# Patient Record
Sex: Female | Born: 1960 | Race: Black or African American | Hispanic: No | Marital: Married | Smoking: Current every day smoker
Health system: Southern US, Community
[De-identification: ages and names within clinical notes are randomized; demographics above are authoritative.]

## PROBLEM LIST (undated history)

## (undated) DIAGNOSIS — E785 Hyperlipidemia, unspecified: Secondary | ICD-10-CM

## (undated) DIAGNOSIS — Z5189 Encounter for other specified aftercare: Secondary | ICD-10-CM

## (undated) DIAGNOSIS — J45909 Unspecified asthma, uncomplicated: Secondary | ICD-10-CM

## (undated) DIAGNOSIS — I10 Essential (primary) hypertension: Secondary | ICD-10-CM

## (undated) DIAGNOSIS — M1990 Unspecified osteoarthritis, unspecified site: Secondary | ICD-10-CM

## (undated) DIAGNOSIS — F419 Anxiety disorder, unspecified: Secondary | ICD-10-CM

## (undated) DIAGNOSIS — I700 Atherosclerosis of aorta: Secondary | ICD-10-CM

## (undated) HISTORY — DX: Encounter for other specified aftercare: Z51.89

## (undated) HISTORY — PX: FOOT SURGERY: SHX648

## (undated) HISTORY — DX: Unspecified osteoarthritis, unspecified site: M19.90

## (undated) HISTORY — DX: Unspecified asthma, uncomplicated: J45.909

## (undated) HISTORY — DX: Essential (primary) hypertension: I10

## (undated) HISTORY — PX: COLONOSCOPY: SHX174

## (undated) HISTORY — DX: Atherosclerosis of aorta: I70.0

## (undated) HISTORY — PX: TUBAL LIGATION: SHX77

## (undated) HISTORY — DX: Hyperlipidemia, unspecified: E78.5

## (undated) HISTORY — DX: Anxiety disorder, unspecified: F41.9

---

## 1999-08-05 ENCOUNTER — Other Ambulatory Visit: Admission: RE | Admit: 1999-08-05 | Discharge: 1999-08-05 | Payer: Self-pay | Admitting: Obstetrics and Gynecology

## 2000-01-22 ENCOUNTER — Encounter: Admission: RE | Admit: 2000-01-22 | Discharge: 2000-01-22 | Payer: Self-pay | Admitting: Hematology and Oncology

## 2000-03-24 ENCOUNTER — Emergency Department (HOSPITAL_COMMUNITY): Admission: EM | Admit: 2000-03-24 | Discharge: 2000-03-24 | Payer: Self-pay | Admitting: *Deleted

## 2000-03-24 ENCOUNTER — Encounter: Payer: Self-pay | Admitting: *Deleted

## 2000-10-27 ENCOUNTER — Emergency Department (HOSPITAL_COMMUNITY): Admission: EM | Admit: 2000-10-27 | Discharge: 2000-10-27 | Payer: Self-pay | Admitting: Emergency Medicine

## 2000-10-28 ENCOUNTER — Other Ambulatory Visit: Admission: RE | Admit: 2000-10-28 | Discharge: 2000-10-28 | Payer: Self-pay | Admitting: Gynecology

## 2000-11-02 ENCOUNTER — Encounter: Payer: Self-pay | Admitting: Gynecology

## 2000-11-02 ENCOUNTER — Encounter: Admission: RE | Admit: 2000-11-02 | Discharge: 2000-11-02 | Payer: Self-pay | Admitting: Gynecology

## 2001-01-04 ENCOUNTER — Emergency Department (HOSPITAL_COMMUNITY): Admission: EM | Admit: 2001-01-04 | Discharge: 2001-01-04 | Payer: Self-pay

## 2001-07-15 ENCOUNTER — Encounter: Payer: Self-pay | Admitting: Internal Medicine

## 2001-07-15 ENCOUNTER — Encounter: Admission: RE | Admit: 2001-07-15 | Discharge: 2001-07-15 | Payer: Self-pay | Admitting: Internal Medicine

## 2004-03-07 ENCOUNTER — Emergency Department (HOSPITAL_COMMUNITY): Admission: EM | Admit: 2004-03-07 | Discharge: 2004-03-07 | Payer: Self-pay | Admitting: Emergency Medicine

## 2004-05-11 ENCOUNTER — Emergency Department (HOSPITAL_COMMUNITY): Admission: EM | Admit: 2004-05-11 | Discharge: 2004-05-11 | Payer: Self-pay | Admitting: Emergency Medicine

## 2004-08-15 ENCOUNTER — Encounter: Admission: RE | Admit: 2004-08-15 | Discharge: 2004-08-15 | Payer: Self-pay | Admitting: Internal Medicine

## 2005-01-07 ENCOUNTER — Encounter: Admission: RE | Admit: 2005-01-07 | Discharge: 2005-01-07 | Payer: Self-pay | Admitting: Internal Medicine

## 2005-03-23 ENCOUNTER — Other Ambulatory Visit: Admission: RE | Admit: 2005-03-23 | Discharge: 2005-03-23 | Payer: Self-pay | Admitting: Obstetrics and Gynecology

## 2005-04-15 ENCOUNTER — Encounter: Admission: RE | Admit: 2005-04-15 | Discharge: 2005-04-15 | Payer: Self-pay | Admitting: Obstetrics and Gynecology

## 2005-10-21 ENCOUNTER — Encounter: Admission: RE | Admit: 2005-10-21 | Discharge: 2005-10-21 | Payer: Self-pay | Admitting: Internal Medicine

## 2006-11-09 ENCOUNTER — Emergency Department (HOSPITAL_COMMUNITY): Admission: EM | Admit: 2006-11-09 | Discharge: 2006-11-09 | Payer: Self-pay | Admitting: Emergency Medicine

## 2006-11-17 ENCOUNTER — Encounter: Admission: RE | Admit: 2006-11-17 | Discharge: 2006-11-17 | Payer: Self-pay | Admitting: Internal Medicine

## 2009-07-05 ENCOUNTER — Emergency Department (HOSPITAL_COMMUNITY): Admission: EM | Admit: 2009-07-05 | Discharge: 2009-07-05 | Payer: Self-pay | Admitting: Emergency Medicine

## 2010-07-13 ENCOUNTER — Encounter: Payer: Self-pay | Admitting: Internal Medicine

## 2010-09-06 LAB — POCT CARDIAC MARKERS
Myoglobin, poc: 31 ng/mL (ref 12–200)
Troponin i, poc: <0.05 ng/mL (ref 0.00–0.09)

## 2010-09-06 LAB — HEPATIC FUNCTION PANEL
Albumin: 3.7 g/dL (ref 3.5–5.2)
Bilirubin, Direct: 0.1 mg/dL (ref 0.0–0.3)
Total Bilirubin: 0.3 mg/dL (ref 0.3–1.2)

## 2010-09-06 LAB — BASIC METABOLIC PANEL
BUN: 11 mg/dL (ref 6–23)
Chloride: 103 mEq/L (ref 96–112)
GFR calc Af Amer: >60 mL/min (ref >60)
GFR calc non Af Amer: >60 mL/min (ref >60)
Potassium: 3.2 mEq/L — ABNORMAL LOW (ref 3.5–5.1)
Sodium: 137 mEq/L (ref 135–145)

## 2010-09-06 LAB — CBC
HCT: 42.2 % (ref 36.0–46.0)
Hemoglobin: 14.4 g/dL (ref 12.0–15.0)
MCV: 86.5 fL (ref 78.0–100.0)
Platelets: 213 10*3/uL (ref 150–400)
RBC: 4.88 MIL/uL (ref 3.87–5.11)
WBC: 8 10*3/uL (ref 4.0–10.5)

## 2010-09-06 LAB — DIFFERENTIAL
Eosinophils Relative: 2 % (ref 0–5)
Lymphocytes Relative: 23 % (ref 12–46)
Lymphs Abs: 1.8 10*3/uL (ref 0.7–4.0)
Monocytes Relative: 6 % (ref 3–12)

## 2010-09-06 LAB — LIPASE, BLOOD: Lipase: 18 U/L (ref 11–59)

## 2010-09-06 LAB — POCT PREGNANCY, URINE: Preg Test, Ur: NEGATIVE

## 2010-09-06 LAB — URINALYSIS, ROUTINE W REFLEX MICROSCOPIC
Ketones, ur: NEGATIVE mg/dL
Nitrite: NEGATIVE
Protein, ur: NEGATIVE mg/dL
pH: 6.5 (ref 5.0–8.0)

## 2010-12-03 ENCOUNTER — Emergency Department (HOSPITAL_COMMUNITY)
Admission: EM | Admit: 2010-12-03 | Discharge: 2010-12-03 | Discharge status: Against Medical Advice | Payer: Self-pay | Attending: Emergency Medicine | Admitting: Emergency Medicine

## 2010-12-03 DIAGNOSIS — Z0389 Encounter for observation for other suspected diseases and conditions ruled out: Secondary | ICD-10-CM | POA: Insufficient documentation

## 2011-02-08 ENCOUNTER — Emergency Department (HOSPITAL_COMMUNITY)
Admission: EM | Admit: 2011-02-08 | Discharge: 2011-02-08 | Disposition: A | Discharge status: Home / Self Care | Payer: Self-pay | Attending: Emergency Medicine | Admitting: Emergency Medicine

## 2011-02-08 ENCOUNTER — Emergency Department (HOSPITAL_COMMUNITY): Payer: Self-pay

## 2011-02-08 DIAGNOSIS — I10 Essential (primary) hypertension: Secondary | ICD-10-CM | POA: Insufficient documentation

## 2011-02-08 DIAGNOSIS — X58XXXA Exposure to other specified factors, initial encounter: Secondary | ICD-10-CM | POA: Insufficient documentation

## 2011-02-08 DIAGNOSIS — M549 Dorsalgia, unspecified: Secondary | ICD-10-CM | POA: Insufficient documentation

## 2011-02-08 DIAGNOSIS — R079 Chest pain, unspecified: Secondary | ICD-10-CM | POA: Insufficient documentation

## 2011-02-08 DIAGNOSIS — T148XXA Other injury of unspecified body region, initial encounter: Secondary | ICD-10-CM | POA: Insufficient documentation

## 2012-04-26 ENCOUNTER — Encounter (HOSPITAL_COMMUNITY): Payer: Self-pay | Admitting: Adult Health

## 2012-04-26 ENCOUNTER — Emergency Department (HOSPITAL_COMMUNITY)
Admission: EM | Admit: 2012-04-26 | Discharge: 2012-04-27 | Disposition: A | Discharge status: Home / Self Care | Payer: Self-pay | Attending: Emergency Medicine | Admitting: Emergency Medicine

## 2012-04-26 DIAGNOSIS — R109 Unspecified abdominal pain: Secondary | ICD-10-CM | POA: Insufficient documentation

## 2012-04-26 DIAGNOSIS — R112 Nausea with vomiting, unspecified: Secondary | ICD-10-CM | POA: Insufficient documentation

## 2012-04-26 DIAGNOSIS — R61 Generalized hyperhidrosis: Secondary | ICD-10-CM | POA: Insufficient documentation

## 2012-04-26 DIAGNOSIS — F17200 Nicotine dependence, unspecified, uncomplicated: Secondary | ICD-10-CM | POA: Insufficient documentation

## 2012-04-26 LAB — COMPREHENSIVE METABOLIC PANEL
ALT: 15 U/L (ref 0–35)
AST: 18 U/L (ref 0–37)
Alkaline Phosphatase: 66 U/L (ref 39–117)
CO2: 22 mEq/L (ref 19–32)
Calcium: 9.4 mg/dL (ref 8.4–10.5)
Chloride: 105 mEq/L (ref 96–112)
GFR calc non Af Amer: 81 mL/min — ABNORMAL LOW (ref >90)
Potassium: 3.5 mEq/L (ref 3.5–5.1)
Sodium: 139 mEq/L (ref 135–145)
Total Bilirubin: 0.2 mg/dL — ABNORMAL LOW (ref 0.3–1.2)

## 2012-04-26 LAB — URINALYSIS, MICROSCOPIC ONLY
Bilirubin Urine: NEGATIVE
Glucose, UA: NEGATIVE mg/dL
Hgb urine dipstick: NEGATIVE
Ketones, ur: NEGATIVE mg/dL
pH: 8 (ref 5.0–8.0)

## 2012-04-26 LAB — CBC WITH DIFFERENTIAL/PLATELET
Basophils Relative: 1 % (ref 0–1)
Eosinophils Absolute: 0.3 10*3/uL (ref 0.0–0.7)
Hemoglobin: 14.8 g/dL (ref 12.0–15.0)
MCH: 28.7 pg (ref 26.0–34.0)
MCHC: 35.2 g/dL (ref 30.0–36.0)
Monocytes Relative: 6 % (ref 3–12)
Neutrophils Relative %: 69 % (ref 43–77)

## 2012-04-26 LAB — POCT I-STAT TROPONIN I: Troponin i, poc: 0.02 ng/mL (ref 0.00–0.08)

## 2012-04-26 MED ORDER — ONDANSETRON 4 MG PO TBDP
ORAL_TABLET | ORAL | Status: AC
  Filled 2012-04-26: qty 2

## 2012-04-26 MED ORDER — ONDANSETRON HCL 4 MG/2ML IJ SOLN
4.0000 mg | Freq: Once | INTRAMUSCULAR | Status: DC
  Filled 2012-04-26: qty 2

## 2012-04-26 MED ORDER — FENTANYL CITRATE 0.05 MG/ML IJ SOLN
50.0000 ug | Freq: Once | INTRAMUSCULAR | Status: AC
  Administered 2012-04-26: 50 ug via NASAL
  Filled 2012-04-26: qty 2

## 2012-04-26 NOTE — ED Notes (Addendum)
Pt reports lower bilateral  abdominal pain that began one-two hours ago associated with nausea and vomiting. Pt is restless and tearful, describes the pain as severe. States she had her tubes tied 27 years ago. Denies discharge and odor, denies urinary symptpoms. Had a normal bowel movement today. Pt is hypertensive. Tearful and hyperventilating. Pt informed RN that her chest was hurting her this am, chest pain has subsided.

## 2012-04-27 ENCOUNTER — Encounter (HOSPITAL_COMMUNITY): Payer: Self-pay | Admitting: Radiology

## 2012-04-27 ENCOUNTER — Emergency Department (HOSPITAL_COMMUNITY): Payer: Self-pay

## 2012-04-27 MED ORDER — ONDANSETRON HCL 4 MG/2ML IJ SOLN
4.0000 mg | Freq: Once | INTRAMUSCULAR | Status: AC
  Administered 2012-04-27: 4 mg via INTRAVENOUS

## 2012-04-27 MED ORDER — IOHEXOL 300 MG/ML  SOLN
100.0000 mL | Freq: Once | INTRAMUSCULAR | Status: AC | PRN
  Administered 2012-04-27: 100 mL via INTRAVENOUS

## 2012-04-27 MED ORDER — PROMETHAZINE HCL 12.5 MG PO TABS: 12.5000 mg | ORAL_TABLET | Freq: Four times a day (QID) | ORAL | Status: DC | PRN

## 2012-04-27 MED ORDER — IOHEXOL 300 MG/ML  SOLN
20.0000 mL | INTRAMUSCULAR | Status: AC
  Administered 2012-04-27 (×2): 20 mL via ORAL

## 2012-04-27 MED ORDER — HYDROCODONE-ACETAMINOPHEN 5-325 MG PO TABS: 1.0000 | ORAL_TABLET | Freq: Four times a day (QID) | ORAL | Status: DC | PRN

## 2012-04-27 MED ORDER — HYDROMORPHONE HCL PF 1 MG/ML IJ SOLN
1.0000 mg | Freq: Once | INTRAMUSCULAR | Status: AC
  Administered 2012-04-27: 1 mg via INTRAVENOUS
  Filled 2012-04-27: qty 1

## 2012-04-27 MED ORDER — SODIUM CHLORIDE 0.9 % IV BOLUS (SEPSIS)
1000.0000 mL | Freq: Once | INTRAVENOUS | Status: AC
  Administered 2012-04-27: 1000 mL via INTRAVENOUS

## 2012-04-27 MED ORDER — SODIUM CHLORIDE 0.9 % IV SOLN
INTRAVENOUS | Status: DC
  Administered 2012-04-27: 02:00:00 via INTRAVENOUS

## 2012-04-27 NOTE — ED Notes (Signed)
Patient transported from CT 

## 2012-04-27 NOTE — ED Notes (Signed)
CT informed that patient has completed her PO contrast

## 2012-04-27 NOTE — ED Notes (Signed)
Patient has completed PO contrast 

## 2012-04-27 NOTE — ED Notes (Signed)
Patient transported to CT 

## 2012-04-27 NOTE — ED Provider Notes (Addendum)
History     CSN: [id number]  Arrival date & time 04/26/12  2053   First MD Initiated Contact with Patient 04/26/12 2347      Chief Complaint  Patient presents with  . Abdominal Pain    (Consider location/radiation/quality/duration/timing/severity/associated sxs/prior treatment) The history is provided by the patient.   patient is a 51 year old female presenting with abdominal pain that began 2 hours prior to arrival associated with nausea and vomiting. Patient was in a lot of pain in the waiting room which was 10 out of 10. Patient now has improved the pain is currently a 4/10. Patient denies any vaginal discharge or urinary symptoms. Patient had normal bowel movements today. Patient did state that there was some anterior chest pain earlier in the day in the morning prior to noon. Chest pain has resolved. The abdominal pain was mostly in the lower quadrants. Started specifically at 5:30. Associated with some diaphoresis vomiting 4 times. No diarrhea pain described as sharp but is now on a.  History reviewed. No pertinent past medical history.  History reviewed. No pertinent past surgical history.  History reviewed. No pertinent family history.  History  Substance Use Topics  . Smoking status: Current Every Day Smoker    Types: Cigarettes  . Smokeless tobacco: Not on file  . Alcohol Use: No    OB History    Grav Para Term Preterm Abortions TAB SAB Ect Mult Living                  Review of Systems  Constitutional: Positive for diaphoresis. Negative for fever.  HENT: Negative for neck pain.   Eyes: Negative for redness.  Respiratory: Negative for shortness of breath.   Cardiovascular: Negative for chest pain.  Gastrointestinal: Positive for nausea, vomiting and abdominal pain. Negative for diarrhea.  Genitourinary: Negative for dysuria and hematuria.  Musculoskeletal: Negative for back pain.  Skin: Negative for rash.  Neurological: Negative for headaches.    Hematological: Does not bruise/bleed easily.    Allergies  Review of patient's allergies indicates no known allergies.  Home Medications   Current Outpatient Rx  Name  Route  Sig  Dispense  Refill  . ACETAMINOPHEN 500 MG PO TABS   Oral   Take 1,000 mg by mouth every 6 (six) hours as needed.         . BC HEADACHE POWDER PO   Oral   Take 1 packet by mouth once as needed. For pain         . IBUPROFEN 200 MG PO TABS   Oral   Take 200 mg by mouth every 6 (six) hours as needed. For pain         . NAPROXEN SODIUM 220 MG PO TABS   Oral   Take 220 mg by mouth 2 (two) times daily with a meal.         . OVER THE COUNTER MEDICATION   Oral   Take 2 tablets by mouth at bedtime as needed. For arthritis pain/sleep         . HYDROCODONE-ACETAMINOPHEN 5-325 MG PO TABS   Oral   Take 1-2 tablets by mouth every 6 (six) hours as needed for pain.   10 tablet   0   . PROMETHAZINE HCL 12.5 MG PO TABS   Oral   Take 1 tablet (12.5 mg total) by mouth every 6 (six) hours as needed for nausea.   12 tablet   0     BP 166/75  Pulse 77  Temp 98.7 F (37.1 C) (Oral)  Resp 16  SpO2 99%  LMP 04/20/2012  Physical Exam  Nursing note and vitals reviewed. Constitutional: She is oriented to person, place, and time. She appears well-developed and well-nourished. No distress.  HENT:  Head: Normocephalic and atraumatic.  Mouth/Throat: Oropharynx is clear and moist.  Eyes: Conjunctivae normal and EOM are normal. Pupils are equal, round, and reactive to light.  Neck: Normal range of motion. Neck supple.  Cardiovascular: Normal rate, regular rhythm and normal heart sounds.   No murmur heard. Pulmonary/Chest: Effort normal and breath sounds normal.  Abdominal: Soft. Bowel sounds are normal. There is no tenderness.  Neurological: She is alert and oriented to person, place, and time. No cranial nerve deficit. She exhibits normal muscle tone. Coordination normal.  Skin: Skin is warm. No  rash noted.    ED Course  Procedures (including critical care time)  Labs Reviewed  COMPREHENSIVE METABOLIC PANEL - Abnormal; Notable for the following:    Glucose, Bld 102 (*)     Total Bilirubin 0.2 (*)     GFR calc non Af Amer 81 (*)     All other components within normal limits  CBC WITH DIFFERENTIAL - Abnormal; Notable for the following:    RBC 5.15 (*)     All other components within normal limits  URINALYSIS, MICROSCOPIC ONLY  POCT PREGNANCY, URINE  POCT I-STAT TROPONIN I   Ct Abdomen Pelvis W Contrast  04/27/2012  *RADIOLOGY REPORT*  Clinical Data: Bilateral lower quadrant abdominal pain, with nausea and vomiting.  CT ABDOMEN AND PELVIS WITH CONTRAST  Technique:  Multidetector CT imaging of the abdomen and pelvis was performed following the standard protocol during bolus administration of intravenous contrast.  Contrast:  100 mL of Omnipaque 300 IV contrast  Comparison: None.  Findings: The visualized lung bases are clear.  The liver and spleen are unremarkable in appearance.  The gallbladder is within normal limits.  Slight prominence of the adrenal glands, particularly on the left, may reflect mild adrenal hyperplasia.  No definite dominant mass is seen.  The pancreas is unremarkable in appearance.  The kidneys are unremarkable in appearance.  There is no evidence of hydronephrosis.  No renal or ureteral stones are seen.  No perinephric stranding is appreciated.  No free fluid is identified.  The small bowel is unremarkable in appearance.  The stomach is within normal limits.  No acute vascular abnormalities are seen.  Scattered calcification is noted along the distal abdominal aorta and its branches.  The appendix is normal in caliber and contains air, without evidence for appendicitis.  The colon is unremarkable in appearance.  The bladder is mildly distended; the anterior bladder wall is significantly thickened.  This may simply reflect decompression, though correlation is suggested  for associated symptoms. Malignancy is considered less likely but cannot be excluded.  There is suggestion of a 3.6 cm fibroid at the fundus of the uterus.  The uterus is otherwise unremarkable in appearance.  The ovaries are relatively symmetric; no suspicious adnexal masses are seen.  No inguinal lymphadenopathy is seen.  No acute osseous abnormalities are identified.  IMPRESSION:  1.  No acute abnormalities seen within the abdomen or pelvis. 2.  Significant thickening of the anterior bladder wall; this may simply reflect decompression, though correlation is suggested for associated symptoms.  Malignancy is considered less likely but cannot be excluded.  3.  Likely fibroid at the fundus of the uterus. 4.  Slight prominence  of the adrenal glands may reflect adrenal hyperplasia. 5.  Scattered calcification along the distal abdominal aorta and its branches.   Original Report Authenticated By: Tonia Ghent, M.D.    Results for orders placed during the hospital encounter of 04/26/12  URINALYSIS, MICROSCOPIC ONLY      Component Value Range   Color, Urine YELLOW  YELLOW   APPearance CLEAR  CLEAR   Specific Gravity, Urine 1.011  1.005 - 1.030   pH 8.0  5.0 - 8.0   Glucose, UA NEGATIVE  NEGATIVE mg/dL   Hgb urine dipstick NEGATIVE  NEGATIVE   Bilirubin Urine NEGATIVE  NEGATIVE   Ketones, ur NEGATIVE  NEGATIVE mg/dL   Protein, ur NEGATIVE  NEGATIVE mg/dL   Urobilinogen, UA 0.2  0.0 - 1.0 mg/dL   Nitrite NEGATIVE  NEGATIVE   Leukocytes, UA NEGATIVE  NEGATIVE   RBC / HPF 0-2  <3 RBC/hpf   Bacteria, UA RARE  RARE   Squamous Epithelial / LPF RARE  RARE  COMPREHENSIVE METABOLIC PANEL      Component Value Range   Sodium 139  135 - 145 mEq/L   Potassium 3.5  3.5 - 5.1 mEq/L   Chloride 105  96 - 112 mEq/L   CO2 22  19 - 32 mEq/L   Glucose, Bld 102 (*) 70 - 99 mg/dL   BUN 14  6 - 23 mg/dL   Creatinine, Ser 6.29  0.50 - 1.10 mg/dL   Calcium 9.4  8.4 - 52.8 mg/dL   Total Protein 7.0  6.0 - 8.3 g/dL    Albumin 3.9  3.5 - 5.2 g/dL   AST 18  0 - 37 U/L   ALT 15  0 - 35 U/L   Alkaline Phosphatase 66  39 - 117 U/L   Total Bilirubin 0.2 (*) 0.3 - 1.2 mg/dL   GFR calc non Af Amer 81 (*) >90 mL/min   GFR calc Af Amer >90  >90 mL/min  CBC WITH DIFFERENTIAL      Component Value Range   WBC 9.8  4.0 - 10.5 K/uL   RBC 5.15 (*) 3.87 - 5.11 MIL/uL   Hemoglobin 14.8  12.0 - 15.0 g/dL   HCT 41.3  24.4 - 01.0 %   MCV 81.7  78.0 - 100.0 fL   MCH 28.7  26.0 - 34.0 pg   MCHC 35.2  30.0 - 36.0 g/dL   RDW 27.2  53.6 - 64.4 %   Platelets 247  150 - 400 K/uL   Neutrophils Relative 69  43 - 77 %   Neutro Abs 6.7  1.7 - 7.7 K/uL   Lymphocytes Relative 22  12 - 46 %   Lymphs Abs 2.2  0.7 - 4.0 K/uL   Monocytes Relative 6  3 - 12 %   Monocytes Absolute 0.6  0.1 - 1.0 K/uL   Eosinophils Relative 3  0 - 5 %   Eosinophils Absolute 0.3  0.0 - 0.7 K/uL   Basophils Relative 1  0 - 1 %   Basophils Absolute 0.1  0.0 - 0.1 K/uL  POCT PREGNANCY, URINE      Component Value Range   Preg Test, Ur NEGATIVE  NEGATIVE  POCT I-STAT TROPONIN I      Component Value Range   Troponin i, poc 0.02  0.00 - 0.08 ng/mL   Comment 3              1. Abdominal pain  MDM  Patient improved in the emergency department. Abdominal pain is improved significantly no further vomiting. Workup without a need specific findings. May have been related to a viral bug. Patient does have some thickening of her bladder but no evidence urinary tract infection we'll have her followup with her primary care Dr. they may want to decide to arrange a cystoscope through urology.        Shelda Jakes, MD 04/27/12 1610  Shelda Jakes, MD 04/27/12 [phone]

## 2017-10-11 ENCOUNTER — Encounter: Payer: Self-pay | Admitting: Family Medicine

## 2017-10-11 ENCOUNTER — Ambulatory Visit (INDEPENDENT_AMBULATORY_CARE_PROVIDER_SITE_OTHER): Payer: BLUE CROSS/BLUE SHIELD | Admitting: Family Medicine

## 2017-10-11 VITALS — BP 140/88 | HR 80 | Ht 63.5 in | Wt 166.6 lb

## 2017-10-11 DIAGNOSIS — I10 Essential (primary) hypertension: Secondary | ICD-10-CM | POA: Diagnosis not present

## 2017-10-11 DIAGNOSIS — Z7689 Persons encountering health services in other specified circumstances: Secondary | ICD-10-CM | POA: Diagnosis not present

## 2017-10-11 DIAGNOSIS — F17200 Nicotine dependence, unspecified, uncomplicated: Secondary | ICD-10-CM | POA: Diagnosis not present

## 2017-10-11 MED ORDER — AMLODIPINE BESYLATE 5 MG PO TABS: 5.0000 mg | ORAL_TABLET | Freq: Every day | ORAL | 1 refills | Status: DC

## 2017-10-11 MED ORDER — HYDROCHLOROTHIAZIDE 12.5 MG PO TABS: 12.5000 mg | ORAL_TABLET | Freq: Every day | ORAL | 1 refills | Status: DC

## 2017-10-11 NOTE — Patient Instructions (Addendum)
Your BP is 140/88. Goal BP is <130/80.    Eat a low salt diet. Continue being active.   Return in 4 weeks with your BP machine and readings.  Come back fasting for your annual exam.

## 2017-10-11 NOTE — Progress Notes (Signed)
   Subjective:    Patient ID: Evelyn [name], female    DOB: 1960/07/03, 57 y.o.   MRN: [id number]  HPI Chief Complaint  Patient presents with  . new pt    new pt get established. bp med she started has now given her a cough   She is new to the practice and here to establish care.  Previous medical care: UC or ED Dr. Tye Savoy was her PCP more than 10 years ago.   Dr. Gertie Fey- OB/GYN and has an upcoming appointment.   HTN- diagnosed in 2006. States she took medication for her BP for a while but she has been off medication for years until approximately 3 weeks ago when she went to the dentist and was told her BP was "very high".  She was started on lisinopril-HCTZ and she has noticed improvement in her BP. However, since starting the medication she has noticed a dry cough. States the pharmacist told her she would have a cough and it cough go away.  She would like switch medications.   Denies history of allergies or GERD.   Denies fever, chills, rhinorrhea, nasal congestion, ear pain, sore throat, dizziness, chest pain, palpitations, shortness of breath, abdominal pain, N/V/D, urinary symptoms, LE edema.   Smoker- since age 89. States she knows she needs to stop. Her mother has COPD and was a heavy smoker until recently.   Married and has 2 kids. 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   LMP: January 2019   Reviewed allergies, medications, past medical, surgical, family, and social history.    Review of Systems Pertinent positives and negatives in the history of present illness.     Objective:   Physical Exam BP 140/88   Pulse 80   Ht 5' 3.5" (1.613 m)   Wt 166 lb 9.6 oz (75.6 kg)   LMP 07/13/2017   SpO2 96%   BMI 29.05 kg/m  Alert and in no distress. Tympanic membranes and canals are normal. Pharyngeal area is normal. Neck is supple without adenopathy or thyromegaly. Cardiac exam shows a regular sinus rhythm without murmurs or gallops. Lungs are clear to auscultation. No LE  edema. Skin is warm and dry.      Assessment & Plan:  Essential hypertension - Plan: amLODipine (NORVASC) 5 MG tablet, hydrochlorothiazide (HYDRODIURIL) 12.5 MG tablet, CBC with Differential/Platelet, Comprehensive metabolic panel  Encounter to establish care  Smoker  Stop lisinopril. Start amlodipine and HCTZ. We will see if her dry cough resolves. Counseling done on controlling modifiable risk factors such as stopping smoking, healthy diet and exercise.  She is overdue for a CPE. She will come back for HTN follow up in 4 weeks and a fasting CPE. She will bring in her BP cuff along with readings. Check CBC, CMP.

## 2017-10-12 LAB — CBC WITH DIFFERENTIAL/PLATELET
BASOS: 1 %
Basophils Absolute: 0 10*3/uL (ref 0.0–0.2)
EOS (ABSOLUTE): 0.2 10*3/uL (ref 0.0–0.4)
EOS: 3 %
HEMATOCRIT: 44.5 % (ref 34.0–46.6)
Hemoglobin: 15.2 g/dL (ref 11.1–15.9)
IMMATURE GRANS (ABS): 0 10*3/uL (ref 0.0–0.1)
IMMATURE GRANULOCYTES: 0 %
LYMPHS: 35 %
Lymphocytes Absolute: 2.7 10*3/uL (ref 0.7–3.1)
MCH: 28.1 pg (ref 26.6–33.0)
MCHC: 34.2 g/dL (ref 31.5–35.7)
MCV: 82 fL (ref 79–97)
Monocytes Absolute: 0.6 10*3/uL (ref 0.1–0.9)
Monocytes: 8 %
NEUTROS PCT: 53 %
Neutrophils Absolute: 4 10*3/uL (ref 1.4–7.0)
Platelets: 268 10*3/uL (ref 150–379)
RBC: 5.41 x10E6/uL — ABNORMAL HIGH (ref 3.77–5.28)
RDW: 15.7 % — ABNORMAL HIGH (ref 12.3–15.4)
WBC: 7.5 10*3/uL (ref 3.4–10.8)

## 2017-10-12 LAB — COMPREHENSIVE METABOLIC PANEL
A/G RATIO: 1.6 (ref 1.2–2.2)
ALT: 14 IU/L (ref 0–32)
AST: 17 IU/L (ref 0–40)
Albumin: 4.5 g/dL (ref 3.5–5.5)
Alkaline Phosphatase: 81 IU/L (ref 39–117)
BUN/Creatinine Ratio: 16 (ref 9–23)
BUN: 17 mg/dL (ref 6–24)
Bilirubin Total: <0.2 mg/dL (ref 0.0–1.2)
CALCIUM: 9.9 mg/dL (ref 8.7–10.2)
CO2: 21 mmol/L (ref 20–29)
Chloride: 105 mmol/L (ref 96–106)
Creatinine, Ser: 1.05 mg/dL — ABNORMAL HIGH (ref 0.57–1.00)
GFR, EST AFRICAN AMERICAN: 69 mL/min/{1.73_m2} (ref >59)
GFR, EST NON AFRICAN AMERICAN: 60 mL/min/{1.73_m2} (ref >59)
GLOBULIN, TOTAL: 2.8 g/dL (ref 1.5–4.5)
Glucose: 114 mg/dL — ABNORMAL HIGH (ref 65–99)
POTASSIUM: 4 mmol/L (ref 3.5–5.2)
SODIUM: 144 mmol/L (ref 134–144)
TOTAL PROTEIN: 7.3 g/dL (ref 6.0–8.5)

## 2017-11-10 NOTE — Progress Notes (Signed)
Subjective:    Patient ID: Evelyn [name], female    DOB: [birth year], 57 y.o.   MRN: [id number]  HPI Chief Complaint  Patient presents with  . not-fasting cpe    not fasting cpe and follow-up on bp. sees obgyn   She is here for a complete physical exam and to follow up on HTN.   BP at home has been between 140/77- 169/97 Diet high in salt.  Reports good medication compliance.  Smoker since age 35. 91- 36 per day. She has been thinking about stopping.   Other providers: Dr. Gertie Fey OB/GYN  Social history: Lives with husband and 55 year old son Denies drinking alcohol, drug use  Diet: fairly healthy  Exercise: 3-4 days per week.   Immunizations: Tdap - 2013  Health maintenance:  Mammogram: 5 years ago. Has an appointment with OB/GYN June 26th Colonoscopy: never  Last Gynecological Exam: June 26th appt Last Menstrual cycle: January 2019  Last Dental Exam: 2 weeks ago  Last Eye Exam: years   Wears seatbelt always,  smoke detectors in home and functioning, does not text while driving and feels safe in home environment.   Reviewed allergies, medications, past medical, surgical, family, and social history.     Review of Systems Review of Systems Constitutional: -fever, -chills, -sweats, -unexpected weight change,-fatigue ENT: -runny nose, -ear pain, -sore throat Cardiology:  -chest pain, -palpitations, -edema Respiratory: -cough, -shortness of breath, -wheezing Gastroenterology: -abdominal pain, -nausea, -vomiting, -diarrhea, -constipation  Hematology: -bleeding or bruising problems Musculoskeletal: -arthralgias, -myalgias, -joint swelling, -back pain Ophthalmology: -vision changes Urology: -dysuria, -difficulty urinating, -hematuria, -urinary frequency, -urgency Neurology: -headache, -weakness, -tingling, -numbness       Objective:   Physical Exam BP (!) 161/94 Comment: pt's cuff  Pulse 94   Ht 5' 3.5" (1.613 m)   Wt 164 lb 3.2 oz (74.5 kg)   LMP 07/14/2017    BMI 28.63 kg/m   General Appearance:    Alert, cooperative, no distress, appears stated age  Head:    Normocephalic, without obvious abnormality, atraumatic  Eyes:    PERRL, conjunctiva/corneas clear, EOM's intact, fundi    benign  Ears:    Normal TM's and external ear canals  Nose:   Nares normal, mucosa normal, no drainage or sinus   tenderness  Throat:   Lips, mucosa, and tongue normal; teeth and gums normal  Neck:   Supple, no lymphadenopathy;  thyroid:  no   enlargement/tenderness/nodules; no carotid   bruit or JVD  Back:    Spine nontender, no curvature, ROM normal, no CVA     tenderness  Lungs:     Clear to auscultation bilaterally without wheezes, rales or     ronchi; respirations unlabored  Chest Wall:    No tenderness or deformity   Heart:    Regular rate and rhythm, S1 and S2 normal, no murmur, rub   or gallop  Breast Exam:    OB/GYN   Abdomen:     Soft, non-tender, nondistended, normoactive bowel sounds,    no masses, no hepatosplenomegaly  Genitalia:    OB/GYN     Extremities:   No clubbing, cyanosis or edema  Pulses:   2+ and symmetric all extremities  Skin:   Skin color, texture, turgor normal, no rashes or lesions  Lymph nodes:   Cervical, supraclavicular, and axillary nodes normal  Neurologic:   CNII-XII intact, normal strength, sensation and gait; reflexes 2+ and symmetric throughout  Psych:   Normal mood, affect, hygiene and grooming.    Urinalysis dipstick: negative       Assessment & Plan:  Routine general medical examination at a health care facility - Plan: Lipid panel, POCT Urinalysis DIP (Proadvantage Device)  Essential hypertension - Plan: amLODipine (NORVASC) 10 MG tablet, DISCONTINUED: amLODipine (NORVASC) 10 MG tablet  Calcification of abdominal aorta (HCC)  Screen for colon cancer - Plan: Ambulatory referral to Gastroenterology  Vaccine counseling  Need for hepatitis C screening test - Plan: Hepatitis C antibody  Screening for HIV  without presence of risk factors - Plan: HIV antibody  Screening for lipid disorders - Plan: Lipid panel  Smoker  Discussed risk factors for heart disease. BP is uncontrolled. We started her on low dose amlodipine and HCTZ at her previous visit and stopped lisinopril due to cough. Apparently her cough has cleared. We will increase her amlodipine and continue on current dose of HCTZ. Avoid sodium.  GI referral.  Screen for HIV and hepatitis C.  Screen for lipid disorders. Discussed that she does have aorta calcification.  Encouraged her to stop smoking.  Vaccine counseling done. She will check on Shingrix.  Follow up in 4 weeks for HTN and bring in readings. She will return for labs, not fasting today.

## 2017-11-11 ENCOUNTER — Ambulatory Visit (INDEPENDENT_AMBULATORY_CARE_PROVIDER_SITE_OTHER): Payer: BLUE CROSS/BLUE SHIELD | Admitting: Family Medicine

## 2017-11-11 ENCOUNTER — Encounter: Payer: Self-pay | Admitting: Family Medicine

## 2017-11-11 VITALS — BP 161/94 | HR 94 | Ht 63.5 in | Wt 164.2 lb

## 2017-11-11 DIAGNOSIS — Z7189 Other specified counseling: Secondary | ICD-10-CM | POA: Diagnosis not present

## 2017-11-11 DIAGNOSIS — Z13220 Encounter for screening for lipoid disorders: Secondary | ICD-10-CM

## 2017-11-11 DIAGNOSIS — Z0000 Encounter for general adult medical examination without abnormal findings: Secondary | ICD-10-CM | POA: Diagnosis not present

## 2017-11-11 DIAGNOSIS — I700 Atherosclerosis of aorta: Secondary | ICD-10-CM | POA: Diagnosis not present

## 2017-11-11 DIAGNOSIS — Z114 Encounter for screening for human immunodeficiency virus [HIV]: Secondary | ICD-10-CM | POA: Diagnosis not present

## 2017-11-11 DIAGNOSIS — Z7185 Encounter for immunization safety counseling: Secondary | ICD-10-CM

## 2017-11-11 DIAGNOSIS — F17200 Nicotine dependence, unspecified, uncomplicated: Secondary | ICD-10-CM

## 2017-11-11 DIAGNOSIS — I10 Essential (primary) hypertension: Secondary | ICD-10-CM | POA: Diagnosis not present

## 2017-11-11 DIAGNOSIS — Z1159 Encounter for screening for other viral diseases: Secondary | ICD-10-CM

## 2017-11-11 DIAGNOSIS — Z1211 Encounter for screening for malignant neoplasm of colon: Secondary | ICD-10-CM | POA: Diagnosis not present

## 2017-11-11 HISTORY — DX: Atherosclerosis of aorta: I70.0

## 2017-11-11 LAB — POCT URINALYSIS DIP (PROADVANTAGE DEVICE)
BILIRUBIN UA: NEGATIVE mg/dL
Bilirubin, UA: NEGATIVE
Glucose, UA: NEGATIVE mg/dL
Leukocytes, UA: NEGATIVE
Nitrite, UA: NEGATIVE
PH UA: 6 (ref 5.0–8.0)
PROTEIN UA: NEGATIVE mg/dL
RBC UA: NEGATIVE
Specific Gravity, Urine: 1.015
Urobilinogen, Ur: NEGATIVE

## 2017-11-11 MED ORDER — AMLODIPINE BESYLATE 10 MG PO TABS: 10.0000 mg | ORAL_TABLET | Freq: Every day | ORAL | 2 refills | Status: DC

## 2017-11-11 MED ORDER — AMLODIPINE BESYLATE 10 MG PO TABS: 10.0000 mg | ORAL_TABLET | Freq: Every day | ORAL | 1 refills | Status: DC

## 2017-11-11 NOTE — Patient Instructions (Addendum)
Your blood pressure is still not in goal range.  I am increasing your amlodipine dose.  Continue on hydrochlorothiazide. Cut out sodium including see salt.  I strongly encourage you to stop smoking.  If you would like to see me and discuss this further please do.  Please return fasting to have your cholesterol checked.  [facility] will call you to schedule your colonoscopy visit.  I would like to see you back in 4 weeks for your hypertension.  Please bring in your blood pressure readings to that visit.   Call your insurance company and find out about your co-pay for the Shingrix vaccine.     Preventative Care for Adults - Female      MAINTAIN REGULAR HEALTH EXAMS:  A routine yearly physical is a good way to check in with your primary care provider about your health and preventive screening. It is also an opportunity to share updates about your health and any concerns you have, and receive a thorough all-over exam.   Most health insurance companies pay for at least some preventative services.  Check with your health plan for specific coverages.  WHAT PREVENTATIVE SERVICES DO WOMEN NEED?  Adult women should have their weight and blood pressure checked regularly.   Women age 51 and older should have their cholesterol levels checked regularly.  Women should be screened for cervical cancer with a Pap smear and pelvic exam beginning at either age 68, or 3 years after they become sexually activity.    Breast cancer screening generally begins at age 42 with a mammogram and breast exam by your primary care provider.    Beginning at age 92 and continuing to age 82, women should be screened for colorectal cancer.  Certain people may need continued testing until age 42.  Updating vaccinations is part of preventative care.  Vaccinations help protect against diseases such as the flu.  Osteoporosis is a disease in which the bones lose minerals and strength as we age. Women ages  38 and over should discuss this with their caregivers, as should women after menopause who have other risk factors.  Lab tests are generally done as part of preventative care to screen for anemia and blood disorders, to screen for problems with the kidneys and liver, to screen for bladder problems, to check blood sugar, and to check your cholesterol level.  Preventative services generally include counseling about diet, exercise, avoiding tobacco, drugs, excessive alcohol consumption, and sexually transmitted infections.    GENERAL RECOMMENDATIONS FOR GOOD HEALTH:  Healthy diet:  Eat a variety of foods, including fruit, vegetables, animal or vegetable protein, such as meat, fish, chicken, and eggs, or beans, lentils, tofu, and grains, such as rice.  Drink plenty of water daily.  Decrease saturated fat in the diet, avoid lots of red meat, processed foods, sweets, fast foods, and fried foods.  Exercise:  Aerobic exercise helps maintain good heart health. At least 30-40 minutes of moderate-intensity exercise is recommended. For example, a brisk walk that increases your heart rate and breathing. This should be done on most days of the week.   Find a type of exercise or a variety of exercises that you enjoy so that it becomes a part of your daily life.  Examples are running, walking, swimming, water aerobics, and biking.  For motivation and support, explore group exercise such as aerobic class, spin class, Zumba, Yoga,or  martial arts, etc.    Set exercise goals for yourself, such as a certain weight goal,  walk or run in a race such as a 5k walk/run.  Speak to your primary care provider about exercise goals.  Disease prevention:  If you smoke or chew tobacco, find out from your caregiver how to quit. It can literally save your life, no matter how long you have been a tobacco user. If you do not use tobacco, never begin.   Maintain a healthy diet and normal weight. Increased weight leads to  problems with blood pressure and diabetes.   The Body Mass Index or BMI is a way of measuring how much of your body is fat. Having a BMI above 27 increases the risk of heart disease, diabetes, hypertension, stroke and other problems related to obesity. Your caregiver can help determine your BMI and based on it develop an exercise and dietary program to help you achieve or maintain this important measurement at a healthful level.  High blood pressure causes heart and blood vessel problems.  Persistent high blood pressure should be treated with medicine if weight loss and exercise do not work.   Fat and cholesterol leaves deposits in your arteries that can block them. This causes heart disease and vessel disease elsewhere in your body.  If your cholesterol is found to be high, or if you have heart disease or certain other medical conditions, then you may need to have your cholesterol monitored frequently and be treated with medication.   Ask if you should have a cardiac stress test if your history suggests this. A stress test is a test done on a treadmill that looks for heart disease. This test can find disease prior to there being a problem.  Menopause can be associated with physical symptoms and risks. Hormone replacement therapy is available to decrease these. You should talk to your caregiver about whether starting or continuing to take hormones is right for you.   Osteoporosis is a disease in which the bones lose minerals and strength as we age. This can result in serious bone fractures. Risk of osteoporosis can be identified using a bone density scan. Women ages 39 and over should discuss this with their caregivers, as should women after menopause who have other risk factors. Ask your caregiver whether you should be taking a calcium supplement and Vitamin D, to reduce the rate of osteoporosis.   Avoid drinking alcohol in excess (more than two drinks per day).  Avoid use of street drugs. Do not  share needles with anyone. Ask for professional help if you need assistance or instructions on stopping the use of alcohol, cigarettes, and/or drugs.  Brush your teeth twice a day with fluoride toothpaste, and floss once a day. Good oral hygiene prevents tooth decay and gum disease. The problems can be painful, unattractive, and can cause other health problems. Visit your dentist for a routine oral and dental check up and preventive care every 6-12 months.   Look at your skin regularly.  Use a mirror to look at your back. Notify your caregivers of changes in moles, especially if there are changes in shapes, colors, a size larger than a pencil eraser, an irregular border, or development of new moles.  Safety:  Use seatbelts 100% of the time, whether driving or as a passenger.  Use safety devices such as hearing protection if you work in environments with loud noise or significant background noise.  Use safety glasses when doing any work that could send debris in to the eyes.  Use a helmet if you ride a bike or  motorcycle.  Use appropriate safety gear for contact sports.  Talk to your caregiver about gun safety.  Use sunscreen with a SPF (or skin protection factor) of 15 or greater.  Lighter skinned people are at a greater risk of skin cancer. Don't forget to also wear sunglasses in order to protect your eyes from too much damaging sunlight. Damaging sunlight can accelerate cataract formation.   Practice safe sex. Use condoms. Condoms are used for birth control and to help reduce the spread of sexually transmitted infections (or STIs).  Some of the STIs are gonorrhea (the clap), chlamydia, syphilis, trichomonas, herpes, HPV (human papilloma virus) and HIV (human immunodeficiency virus) which causes AIDS. The herpes, HIV and HPV are viral illnesses that have no cure. These can result in disability, cancer and death.   Keep carbon monoxide and smoke detectors in your home functioning at all times. Change  the batteries every 6 months or use a model that plugs into the wall.   Vaccinations:  Stay up to date with your tetanus shots and other required immunizations. You should have a booster for tetanus every 10 years. Be sure to get your flu shot every year, since 5%-20% of the [place] population comes down with the flu. The flu vaccine changes each year, so being vaccinated once is not enough. Get your shot in the fall, before the flu season peaks.   Other vaccines to consider:  Human Papilloma Virus or HPV causes cancer of the cervix, and other infections that can be transmitted from person to person. There is a vaccine for HPV, and females should get immunized between the ages of 4 and 73. It requires a series of 3 shots.   Pneumococcal vaccine to protect against certain types of pneumonia.  This is normally recommended for adults age 30 or older.  However, adults younger than 56 years old with certain underlying conditions such as diabetes, heart or lung disease should also receive the vaccine.  Shingles vaccine to protect against Varicella Zoster if you are older than age 30, or younger than 56 years old with certain underlying illness.  Hepatitis A vaccine to protect against a form of infection of the liver by a virus acquired from food.  Hepatitis B vaccine to protect against a form of infection of the liver by a virus acquired from blood or body fluids, particularly if you work in health care.  If you plan to travel internationally, check with your local health department for specific vaccination recommendations.  Cancer Screening:  Breast cancer screening is essential to preventive care for women. All women age 65 and older should perform a breast self-exam every month. At age 59 and older, women should have their caregiver complete a breast exam each year. Women at ages 69 and older should have a mammogram (x-ray film) of the breasts. Your caregiver can discuss how often you need  mammograms.    Cervical cancer screening includes taking a Pap smear (sample of cells examined under a microscope) from the cervix (end of the uterus). It also includes testing for HPV (Human Papilloma Virus, which can cause cervical cancer). Screening and a pelvic exam should begin at age 79, or 3 years after a woman becomes sexually active. Screening should occur every year, with a Pap smear but no HPV testing, up to age 19. After age 35, you should have a Pap smear every 3 years with HPV testing, if no HPV was found previously.   Most routine colon cancer screening begins  at the age of 54. On a yearly basis, doctors may provide special easy to use take-home tests to check for hidden blood in the stool. Sigmoidoscopy or colonoscopy can detect the earliest forms of colon cancer and is life saving. These tests use a small camera at the end of a tube to directly examine the colon. Speak to your caregiver about this at age 33, when routine screening begins (and is repeated every 5 years unless early forms of pre-cancerous polyps or small growths are found).

## 2017-11-12 ENCOUNTER — Telehealth: Payer: Self-pay | Admitting: Family Medicine

## 2017-11-12 ENCOUNTER — Encounter: Payer: Self-pay | Admitting: Family Medicine

## 2017-11-12 ENCOUNTER — Encounter: Payer: Self-pay | Admitting: Internal Medicine

## 2017-11-12 DIAGNOSIS — I10 Essential (primary) hypertension: Secondary | ICD-10-CM

## 2017-11-12 NOTE — Telephone Encounter (Signed)
[facility] pt called for refills of hydrodiuril. Please send to [facility] [address]

## 2017-11-15 NOTE — Telephone Encounter (Signed)
Please send HCTZ to her new pharmacy. Thanks.

## 2017-11-16 ENCOUNTER — Other Ambulatory Visit: Payer: BLUE CROSS/BLUE SHIELD

## 2017-11-16 DIAGNOSIS — Z0000 Encounter for general adult medical examination without abnormal findings: Secondary | ICD-10-CM

## 2017-11-16 DIAGNOSIS — Z1159 Encounter for screening for other viral diseases: Secondary | ICD-10-CM

## 2017-11-16 DIAGNOSIS — Z114 Encounter for screening for human immunodeficiency virus [HIV]: Secondary | ICD-10-CM

## 2017-11-16 DIAGNOSIS — Z13220 Encounter for screening for lipoid disorders: Secondary | ICD-10-CM

## 2017-11-16 MED ORDER — HYDROCHLOROTHIAZIDE 12.5 MG PO TABS: 12.5000 mg | ORAL_TABLET | Freq: Every day | ORAL | 5 refills | Status: DC

## 2017-11-16 NOTE — Telephone Encounter (Signed)
done

## 2017-11-17 LAB — LIPID PANEL
Chol/HDL Ratio: 2.1 ratio (ref 0.0–4.4)
Cholesterol, Total: 177 mg/dL (ref 100–199)
HDL: 83 mg/dL (ref >39)
LDL CALC: 71 mg/dL (ref 0–99)
TRIGLYCERIDES: 114 mg/dL (ref 0–149)
VLDL CHOLESTEROL CAL: 23 mg/dL (ref 5–40)

## 2017-11-17 LAB — HEPATITIS C ANTIBODY: Hep C Virus Ab: <0.1 s/co ratio (ref 0.0–0.9)

## 2017-11-17 LAB — HIV ANTIBODY (ROUTINE TESTING W REFLEX): HIV SCREEN 4TH GENERATION: NONREACTIVE

## 2017-12-13 ENCOUNTER — Ambulatory Visit: Payer: BLUE CROSS/BLUE SHIELD | Admitting: Family Medicine

## 2017-12-14 ENCOUNTER — Other Ambulatory Visit: Payer: Self-pay

## 2017-12-14 ENCOUNTER — Ambulatory Visit (AMBULATORY_SURGERY_CENTER): Payer: Self-pay

## 2017-12-14 VITALS — Ht 63.0 in | Wt 163.8 lb

## 2017-12-14 DIAGNOSIS — Z1211 Encounter for screening for malignant neoplasm of colon: Secondary | ICD-10-CM

## 2017-12-14 MED ORDER — NA SULFATE-K SULFATE-MG SULF 17.5-3.13-1.6 GM/177ML PO SOLN: 1.0000 | Freq: Once | ORAL | 0 refills | Status: AC

## 2017-12-14 NOTE — Progress Notes (Signed)
Denies allergies to eggs or soy products. Denies complication of anesthesia or sedation. Denies use of weight loss medication. Denies use of O2.   Emmi instructions declined.  

## 2017-12-17 ENCOUNTER — Encounter: Payer: Self-pay | Admitting: Internal Medicine

## 2017-12-27 ENCOUNTER — Ambulatory Visit: Payer: BLUE CROSS/BLUE SHIELD | Admitting: Family Medicine

## 2017-12-27 ENCOUNTER — Telehealth: Payer: Self-pay | Admitting: Internal Medicine

## 2017-12-27 NOTE — Telephone Encounter (Signed)
D. 1-2 weeks

## 2017-12-27 NOTE — Telephone Encounter (Signed)

## 2017-12-28 ENCOUNTER — Ambulatory Visit (AMBULATORY_SURGERY_CENTER): Payer: BLUE CROSS/BLUE SHIELD | Admitting: Internal Medicine

## 2017-12-28 ENCOUNTER — Telehealth: Payer: Self-pay

## 2017-12-28 ENCOUNTER — Encounter: Payer: Self-pay | Admitting: Internal Medicine

## 2017-12-28 VITALS — BP 139/79 | HR 81 | Temp 96.9°F | Resp 26 | Ht 63.5 in | Wt 164.0 lb

## 2017-12-28 DIAGNOSIS — Z1211 Encounter for screening for malignant neoplasm of colon: Secondary | ICD-10-CM

## 2017-12-28 DIAGNOSIS — D122 Benign neoplasm of ascending colon: Secondary | ICD-10-CM

## 2017-12-28 MED ORDER — SODIUM CHLORIDE 0.9 % IV SOLN: 500.0000 mL | Freq: Once | INTRAVENOUS | Status: DC

## 2017-12-28 NOTE — Progress Notes (Signed)
Report to PACU, RN, vss, BBS= Clear.  

## 2017-12-28 NOTE — Op Note (Signed)
[facility] Patient Name: Evelyn [name] Procedure Date: 12/28/2017 2:36 PM MRN: [id number] Endoscopist: Jerene Bears , MD Age: 57 Referring MD:  Date of Birth: Sep 30, 1960 Gender: Female Account #: [id number] Procedure:                Colonoscopy Indications:              Screening for colorectal malignant neoplasm, This                            is the patient's first colonoscopy Medicines:                Monitored Anesthesia Care Procedure:                Pre-Anesthesia Assessment:                           - Prior to the procedure, a History and Physical                            was performed, and patient medications and                            allergies were reviewed. The patient's tolerance of                            previous anesthesia was also reviewed. The risks                            and benefits of the procedure and the sedation                            options and risks were discussed with the patient.                            All questions were answered, and informed consent                            was obtained. Prior Anticoagulants: The patient has                            taken no previous anticoagulant or antiplatelet                            agents. ASA Grade Assessment: II - A patient with                            mild systemic disease. After reviewing the risks                            and benefits, the patient was deemed in                            satisfactory condition to undergo the procedure.  After obtaining informed consent, the colonoscope                            was passed under direct vision. Throughout the                            procedure, the patient's blood pressure, pulse, and                            oxygen saturations were monitored continuously. The                            Colonoscope was introduced through the anus and                            advanced to the terminal ileum.  The colonoscopy was                            performed without difficulty. The patient tolerated                            the procedure well. The quality of the bowel                            preparation was good. The ileocecal valve,                            appendiceal orifice, and rectum were photographed. Scope In: 2:37:48 PM Scope Out: 2:57:11 PM Scope Withdrawal Time: 0 hours 12 minutes 47 seconds  Total Procedure Duration: 0 hours 19 minutes 23 seconds  Findings:                 Hemorrhoids were found on perianal exam.                           Two sessile polyps were found in the ascending                            colon. The polyps were 3 to 4 mm in size. These                            polyps were removed with a cold snare. Resection                            and retrieval were complete.                           Multiple small-mouthed diverticula were found in                            the hepatic flexure, ascending colon and cecum.                           Internal hemorrhoids were found during  retroflexion, during perianal exam and during                            digital exam. The hemorrhoids were large. Complications:            No immediate complications. Estimated Blood Loss:     Estimated blood loss was minimal. Impression:               - Two 3 to 4 mm polyps in the ascending colon,                            removed with a cold snare. Resected and retrieved.                           - Diverticulosis at the hepatic flexure, in the                            ascending colon and in the cecum.                           - Large internal hemorrhoids. Recommendation:           - Patient has a contact number available for                            emergencies. The signs and symptoms of potential                            delayed complications were discussed with the                            patient. Return to normal activities  tomorrow.                            Written discharge instructions were provided to the                            patient.                           - Resume previous diet.                           - Continue present medications.                           - Await pathology results.                           - If hemorrhoidal treatment is needed, the best                            option would be surgical hemorrhoidectomy (given                            size I do not feel internal hemorrhoids are  amenable to banding).                           - Repeat colonoscopy is recommended. The                            colonoscopy date will be determined after pathology                            results from today's exam become available for                            review. Jerene Bears, MD 12/28/2017 3:01:14 PM This report has been signed electronically.

## 2017-12-28 NOTE — Progress Notes (Signed)
I have reviewed the patient's medical history in detail and updated the computerized patient record.

## 2017-12-28 NOTE — Telephone Encounter (Signed)
-----   Message from Jerene Bears, MD sent at 12/28/2017  3:13 PM EDT ----- Regarding: referral Please refer patient to [facility] to see either Dr. Dema Severin, Dr. Marcello Moores or Dr. Johney Maine for symptomatic, large, internal hemorrhoids for hemorrhoidectomy Based on size her hemorrhoids or not felt amenable to hemorrhoidal banding in my clinic Thanks [facility]

## 2017-12-28 NOTE — Patient Instructions (Signed)
YOU HAD AN ENDOSCOPIC PROCEDURE TODAY AT [facility]:   Refer to the procedure report that was given to you for any specific questions about what was found during the examination.  If the procedure report does not answer your questions, please call your gastroenterologist to clarify.  If you requested that your care partner not be given the details of your procedure findings, then the procedure report has been included in a sealed envelope for you to review at your convenience later.  YOU SHOULD EXPECT: Some feelings of bloating in the abdomen. Passage of more gas than usual.  Walking can help get rid of the air that was put into your GI tract during the procedure and reduce the bloating. If you had a lower endoscopy (such as a colonoscopy or flexible sigmoidoscopy) you may notice spotting of blood in your stool or on the toilet paper. If you underwent a bowel prep for your procedure, you may not have a normal bowel movement for a few days.  Please Note:  You might notice some irritation and congestion in your nose or some drainage.  This is from the oxygen used during your procedure.  There is no need for concern and it should clear up in a day or so.  SYMPTOMS TO REPORT IMMEDIATELY:   Following lower endoscopy (colonoscopy or flexible sigmoidoscopy):  Excessive amounts of blood in the stool  Significant tenderness or worsening of abdominal pains  Swelling of the abdomen that is new, acute  Fever of 100F or higher   For urgent or emergent issues, a gastroenterologist can be reached at any hour by calling [phone].   DIET:  We do recommend a small meal at first, but then you may proceed to your regular diet.  Drink plenty of fluids but you should avoid alcoholic beverages for 24 hours.  ACTIVITY:  You should plan to take it easy for the rest of today and you should NOT DRIVE or use heavy machinery until tomorrow (because of the sedation medicines used during the test).     FOLLOW UP: Our staff will call the number listed on your records the next business day following your procedure to check on you and address any questions or concerns that you may have regarding the information given to you following your procedure. If we do not reach you, we will leave a message.  However, if you are feeling well and you are not experiencing any problems, there is no need to return our call.  We will assume that you have returned to your regular daily activities without incident.  If any biopsies were taken you will be contacted by phone or by letter within the next 1-3 weeks.  Please call us at [phone] if you have not heard about the biopsies in 3 weeks.    SIGNATURES/CONFIDENTIALITY: You and/or your care partner have signed paperwork which will be entered into your electronic medical record.  These signatures attest to the fact that that the information above on your After Visit Summary has been reviewed and is understood.  Full responsibility of the confidentiality of this discharge information lies with you and/or your care-partner.    Handouts were given to your care partner on polyps, diverticulosis, and hemorrhoids. You may resume your current medications today. Await biopsy results. If hemorrhoidal treatment is needed, the best option would be surgical hemorrhoidectomy (given the size Dr. Hilarie Fredrickson does not feel internal hemorrhoids are amenable to banding). Please call if any questions  or concerns.   

## 2017-12-28 NOTE — Telephone Encounter (Signed)
Referral faxed to [facility] for surgical referral.

## 2017-12-28 NOTE — Progress Notes (Signed)
No problems noted in the recovery room. Maw  Pt requested a bed pan while she was still waiting for time on the monitor.  Bed pan was given. maw

## 2017-12-28 NOTE — Progress Notes (Signed)
Called to room to assist during endoscopic procedure.  Patient ID and intended procedure confirmed with present staff. Received instructions for my participation in the procedure from the performing physician.  

## 2017-12-29 ENCOUNTER — Telehealth: Payer: Self-pay | Admitting: *Deleted

## 2017-12-29 NOTE — Telephone Encounter (Signed)
  Follow up Call-  Call back number 12/28/2017  Post procedure Call Back phone  # [phone]  Permission to leave phone message Yes  Some recent data might be hidden     Patient questions:  Do you have a fever, pain , or abdominal swelling? No. Pain Score  0 *  Have you tolerated food without any problems? Yes.    Have you been able to return to your normal activities? Yes.    Do you have any questions about your discharge instructions: Diet   No. Medications  No. Follow up visit  No.  Do you have questions or concerns about your Care? No.  Actions: * If pain score is 4 or above: No action needed, pain <4.

## 2017-12-31 ENCOUNTER — Encounter: Payer: Self-pay | Admitting: Internal Medicine

## 2018-01-07 ENCOUNTER — Other Ambulatory Visit: Payer: Self-pay | Admitting: Family Medicine

## 2018-01-07 DIAGNOSIS — I10 Essential (primary) hypertension: Secondary | ICD-10-CM

## 2018-01-10 ENCOUNTER — Encounter: Payer: Self-pay | Admitting: Family Medicine

## 2018-01-10 NOTE — Telephone Encounter (Signed)
No show letter with appt request sent.  °

## 2018-01-27 ENCOUNTER — Other Ambulatory Visit: Payer: Self-pay | Admitting: Family Medicine

## 2018-01-27 DIAGNOSIS — I10 Essential (primary) hypertension: Secondary | ICD-10-CM

## 2018-01-27 NOTE — Telephone Encounter (Signed)
Needs follow up visit and ok to refill for 30 days

## 2018-01-27 NOTE — Telephone Encounter (Signed)
Is this ok to refill?  

## 2018-01-27 NOTE — Telephone Encounter (Signed)
Pt will call back when she looks at her work calendar. I will refill for 30 days

## 2018-02-02 ENCOUNTER — Ambulatory Visit: Payer: BLUE CROSS/BLUE SHIELD | Admitting: Family Medicine

## 2018-02-02 ENCOUNTER — Encounter: Payer: Self-pay | Admitting: Family Medicine

## 2018-02-02 VITALS — BP 140/80 | HR 76 | Ht 63.0 in | Wt 167.6 lb

## 2018-02-02 DIAGNOSIS — R202 Paresthesia of skin: Secondary | ICD-10-CM | POA: Diagnosis not present

## 2018-02-02 DIAGNOSIS — I10 Essential (primary) hypertension: Secondary | ICD-10-CM

## 2018-02-02 DIAGNOSIS — F17200 Nicotine dependence, unspecified, uncomplicated: Secondary | ICD-10-CM | POA: Diagnosis not present

## 2018-02-02 MED ORDER — LOSARTAN POTASSIUM-HCTZ 50-12.5 MG PO TABS: 1.0000 | ORAL_TABLET | Freq: Every day | ORAL | 2 refills | Status: DC

## 2018-02-02 NOTE — Progress Notes (Signed)
   Subjective:    Patient ID: Evelyn Evelyn [name], female    DOB: [birth year], 57 y.o.   MRN: [id number]  HPI Chief Complaint  Patient presents with  . 1 follow-up    1 month follow-up on BP. running in normal. read side effects on med, would like something different   She is here to follow up on HTN and has a new complaints of paresthesias.  Stopped amlodipine due to concerns about potential side effects. Reports taking HCTZ. History of dry cough with lisinopril.   HTN- has not been checking her BP at home.   She has URI symptoms along with some nausea that appears to be related to post nasal drainage.   Reports having intermittent and fleeting sensations of numbness/tingling over the past 2-3 weeks. States this has been in different locations. Occasionally in her upper back, legs, or at times per upper extremities. It is always changing locations. Denies dizziness or weakness.  She is still smoking but has cut back.   Diet - has cut back on sodium.   Menopausal.  Dr. Gertie Fey put her on vitamin D in June. UTD with pap smear and mammogram.   Denies fever, chills, night sweats, unexplained weight loss, headache, vision changes, chest pain, palpitations, shortness of breath, abdominal pain, V/D, urinary symptoms, LE edema.   Reviewed allergies, medications, past medical, surgical, family, and social history.    Review of Systems Pertinent positives and negatives in the history of present illness.     Objective:   Physical Exam BP 140/80   Pulse 76   Ht 5\' 3"  (1.6 m)   Wt 167 lb 9.6 oz (76 kg)   BMI 29.69 kg/m   Alert and oriented in no distress. Pharyngeal area is normal. Neck is supple without adenopathy or thyromegaly. Cardiac exam shows a regular sinus rhythm without murmurs or gallops. Lungs are clear to auscultation. Extremities without edema, pulses intact. Skin is warm and dry, no pallor or rash. PERRLA, EOMs intact, CN II-IX normal. No facial asymmetry or focal weakness.  Equal grip strength. No pronator drift. Normal and symmetric DTRs. Negative Romberg.        Assessment & Plan:  Essential hypertension - Plan: CBC with Differential/Platelet, Comprehensive metabolic panel, losartan-hydrochlorothiazide (HYZAAR) 50-12.5 MG tablet  Smoker  Paresthesias - Plan: CBC with Differential/Platelet, Comprehensive metabolic panel, TSH, T4, free, RPR, Vitamin B12  Blood pressure is not quite at goal.  She stopped amlodipine on her own after reading possible side effects.  In the past she has been intolerant to lisinopril due to dry cough. She reports doing well on hydrochlorothiazide.  I will prescribe combination losartan/HCTZ for her today.  She is aware that this is the only medication she needs to take for her blood pressure. She will keep an eye on her blood pressure at home and bring in her readings when she returns in 3 to 4 weeks. Counseling done on low-sodium diet and increasing her physical activity. Smoking cessation counseling done.  She has cut back but is not ready to stop. Discussed that intermittent paresthesias that is moving to different parts of her body does not make me worried that we will check labs including vitamin B12 Evelyn [name].  She will keep down any new or worsening symptoms and report back. Her neurological exam is normal today. Follow-up pending labs.

## 2018-02-02 NOTE — Patient Instructions (Signed)
Stop the hydrochlorothiazide and amlodipine as discussed.  Start taking the combination tablet with losartan and hydrochlorothiazide.   Keep an eye on your blood pressures at home and bring in your readings when you come in 3 to 4 weeks.  Continue eating a low-salt diet and increase your physical activity.  Make sure you are stretching.  We will call you with your lab results.

## 2018-02-03 LAB — CBC WITH DIFFERENTIAL/PLATELET
BASOS: 1 %
Basophils Absolute: 0 10*3/uL (ref 0.0–0.2)
EOS (ABSOLUTE): 0.1 10*3/uL (ref 0.0–0.4)
Eos: 2 %
Hematocrit: 43.2 % (ref 34.0–46.6)
Hemoglobin: 14.8 g/dL (ref 11.1–15.9)
Immature Grans (Abs): 0 10*3/uL (ref 0.0–0.1)
Immature Granulocytes: 1 %
LYMPHS ABS: 2 10*3/uL (ref 0.7–3.1)
Lymphs: 35 %
MCH: 28.3 pg (ref 26.6–33.0)
MCHC: 34.3 g/dL (ref 31.5–35.7)
MCV: 83 fL (ref 79–97)
Monocytes Absolute: 0.3 10*3/uL (ref 0.1–0.9)
Monocytes: 6 %
NEUTROS ABS: 3.1 10*3/uL (ref 1.4–7.0)
Neutrophils: 55 %
PLATELETS: 251 10*3/uL (ref 150–450)
RBC: 5.23 x10E6/uL (ref 3.77–5.28)
RDW: 15.5 % — ABNORMAL HIGH (ref 12.3–15.4)
WBC: 5.7 10*3/uL (ref 3.4–10.8)

## 2018-02-03 LAB — COMPREHENSIVE METABOLIC PANEL
A/G RATIO: 1.9 (ref 1.2–2.2)
ALT: 18 IU/L (ref 0–32)
AST: 17 IU/L (ref 0–40)
Albumin: 4.7 g/dL (ref 3.5–5.5)
Alkaline Phosphatase: 65 IU/L (ref 39–117)
BILIRUBIN TOTAL: 0.4 mg/dL (ref 0.0–1.2)
BUN/Creatinine Ratio: 20 (ref 9–23)
BUN: 13 mg/dL (ref 6–24)
CHLORIDE: 100 mmol/L (ref 96–106)
CO2: 26 mmol/L (ref 20–29)
Calcium: 9.3 mg/dL (ref 8.7–10.2)
Creatinine, Ser: 0.65 mg/dL (ref 0.57–1.00)
GFR calc non Af Amer: 100 mL/min/{1.73_m2} (ref >59)
GFR, EST AFRICAN AMERICAN: 116 mL/min/{1.73_m2} (ref >59)
Globulin, Total: 2.5 g/dL (ref 1.5–4.5)
Glucose: 98 mg/dL (ref 65–99)
POTASSIUM: 3.5 mmol/L (ref 3.5–5.2)
Sodium: 139 mmol/L (ref 134–144)
TOTAL PROTEIN: 7.2 g/dL (ref 6.0–8.5)

## 2018-02-03 LAB — T4, FREE: FREE T4: 1.11 ng/dL (ref 0.82–1.77)

## 2018-02-03 LAB — VITAMIN B12: Vitamin B-12: 445 pg/mL (ref 232–1245)

## 2018-02-03 LAB — RPR: RPR: NONREACTIVE

## 2018-02-03 LAB — TSH: TSH: 0.986 u[IU]/mL (ref 0.450–4.500)

## 2018-02-23 ENCOUNTER — Ambulatory Visit: Payer: BLUE CROSS/BLUE SHIELD | Admitting: Family Medicine

## 2018-04-13 ENCOUNTER — Ambulatory Visit (INDEPENDENT_AMBULATORY_CARE_PROVIDER_SITE_OTHER): Payer: BLUE CROSS/BLUE SHIELD | Admitting: Nurse Practitioner

## 2018-04-13 ENCOUNTER — Encounter: Payer: Self-pay | Admitting: Nurse Practitioner

## 2018-04-13 VITALS — BP 130/80 | HR 88 | Temp 98.4°F | Ht 63.0 in | Wt 163.4 lb

## 2018-04-13 DIAGNOSIS — M542 Cervicalgia: Secondary | ICD-10-CM | POA: Diagnosis not present

## 2018-04-13 DIAGNOSIS — K649 Unspecified hemorrhoids: Secondary | ICD-10-CM

## 2018-04-13 DIAGNOSIS — I10 Essential (primary) hypertension: Secondary | ICD-10-CM

## 2018-04-13 DIAGNOSIS — R202 Paresthesia of skin: Secondary | ICD-10-CM | POA: Diagnosis not present

## 2018-04-13 DIAGNOSIS — R200 Anesthesia of skin: Secondary | ICD-10-CM

## 2018-04-13 DIAGNOSIS — Z720 Tobacco use: Secondary | ICD-10-CM

## 2018-04-13 MED ORDER — LOSARTAN POTASSIUM-HCTZ 50-12.5 MG PO TABS: 1.0000 | ORAL_TABLET | Freq: Every day | ORAL | 2 refills | Status: DC

## 2018-04-13 MED ORDER — HYDROCORTISONE 1 % EX CREA: TOPICAL_CREAM | CUTANEOUS | 1 refills | Status: DC

## 2018-04-13 NOTE — Progress Notes (Signed)
Subjective:     Patient ID: Evelyn [name] , female    DOB: 18-Jun-1961 , 57 y.o.   MRN: [id number]   Here to establish care - She is here to re-establish care had been a patient of Dr. Baird Cancer.  Most recently had been seeing Chrissie Noa NP up until 01/2018. She had been without insurance since about 2012 and not seen a provider.  GYN Dr. Gertie Fey - PAP done June 2019 normal.  Also told she was menopausal.  Works as a Glass blower/designer.  Married. 2 adult children.    Smoker - 23 years - 1/2 PPD.  Drink - 2 beers daily.  No illicit drug use.  (smoked crack over 20 years ago).    PMH - HTN.   [facility] - mother - lung cancer survivor, kidney disease, COPD.  Type 2 DM, previous history of blood clots.             Father - MI - (deceased)            Brother - Stroke  History of hemorrhoids - seen by a Psychologist, sport and exercise.  When she eats spicy foods makes them worse. Regular bowel movements. She would like for them to be removed. Recommended to take fibercon          Neck Pain   This is a recurrent problem. The current episode started more than 1 month ago. The problem occurs intermittently (once per month). The pain is associated with nothing. The pain is present in the occipital region. The quality of the pain is described as shooting. The pain is same all the time. Associated symptoms include numbness. Pertinent negatives include no headaches or weakness. She has tried nothing for the symptoms.  Hypertension  Associated symptoms include neck pain. Pertinent negatives include no headaches.     Past Medical History:  Diagnosis Date  . Anxiety   . Arthritis   . Blood transfusion without reported diagnosis   . 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 Hypertension       Current Outpatient Medications:  .  ibuprofen (ADVIL,MOTRIN) 800 MG tablet, Take 800 mg by mouth every 6 (six) hours as needed., Disp: , Rfl: 0 .  losartan-hydrochlorothiazide (HYZAAR) 50-12.5 MG tablet, Take 1 tablet by mouth  daily., Disp: 30 tablet, Rfl: 2  Current Facility-Administered Medications:  .  0.9 %  sodium chloride infusion, 500 mL, Intravenous, Once, Pyrtle, Lajuan Lines, MD   Allergies  Allergen Reactions  . Lisinopril     Intolerance cough      Review of Systems  Constitutional: Negative.   HENT: Negative.   Eyes: Negative.   Respiratory: Negative.   Cardiovascular: Negative.   Gastrointestinal: Negative.   Musculoskeletal: Positive for neck pain.  Neurological: Positive for numbness. Negative for dizziness, weakness, light-headedness and headaches.       Will have some sharp pain to back of neck.       Today's Vitals   04/13/18 1021  BP: 130/80  Pulse: 88  Temp: 98.4 F (36.9 C)  TempSrc: Oral  SpO2: 96%  Weight: 163 lb 6.4 oz (74.1 kg)  Height: 5\' 3"  (1.6 m)  PainSc: 0-No pain   Body mass index is 28.95 kg/m.   Objective:  Physical Exam  Constitutional: She is oriented to person, place, and time. She appears well-developed and well-nourished.  Cardiovascular: Normal rate, regular rhythm, normal heart sounds and intact distal pulses.  Musculoskeletal: She exhibits no tenderness.  Right shoulder: Normal.       Left shoulder: Normal.       Left wrist: She exhibits decreased range of motion. She exhibits no tenderness.       Arms: Neurological: She is alert and oriented to person, place, and time.  Skin: Skin is warm and dry. Capillary refill takes less than 2 seconds.        Assessment And Plan:     1. Neck pain  Decreased range of motion  Will send for xray to evaluate for structural damage - DG Cervical Spine 2 or 3 views; Future  2. Numbness and tingling in both hands  May be related to inflammation to cervical nerve.  - DG Cervical Spine 2 or 3 views; Future  3. Essential hypertension  Chronic, controlled, this is her first time to the office  Continue with current medications - losartan-hydrochlorothiazide (HYZAAR) 50-12.5 MG tablet; Take 1 tablet by  mouth daily.  Dispense: 30 tablet; Refill: 2  4. Hemorrhoids, unspecified hemorrhoid type  Few areas of bulging noted to external anus  Will try a hydrocortisone cream - hydrocortisone cream 1 %; Apply to affected area 2 times daily  Dispense: 30 g; Refill: [place], FNP

## 2018-04-13 NOTE — Patient Instructions (Signed)
Take tylenol 500 mg twice a day for 5 days.   Make sure to do neck exercises. Neck Exercises Neck exercises can be important for many reasons: They can help you to improve and maintain flexibility in your neck. This can be especially important as you age. They can help to make your neck stronger. This can make movement easier. They can reduce or prevent neck pain. They may help your upper back.  Ask your health care provider which neck exercises would be best for you. Exercises Neck Press Repeat this exercise 10 times. Do it first thing in the morning and right before bed or as told by your health care provider. Lie on your back on a firm bed or on the floor with a pillow under your head. Use your neck muscles to push your head down on the pillow and straighten your spine. Hold the position as well as you can. Keep your head facing up and your chin tucked. Slowly count to 5 while holding this position. Relax for a few seconds. Then repeat.  Isometric Strengthening Do a full set of these exercises 2 times a day or as told by your health care provider. Sit in a supportive chair and place your hand on your forehead. Push forward with your head and neck while pushing back with your hand. Hold for 10 seconds. Relax. Then repeat the exercise 3 times. Next, do thesequence again, this time putting your hand against the back of your head. Use your head and neck to push backward against the hand pressure. Finally, do the same exercise on either side of your head, pushing sideways against the pressure of your hand.  Prone Head Lifts Repeat this exercise 5 times. Do this 2 times a day or as told by your health care provider. Lie face-down, resting on your elbows so that your chest and upper back are raised. Start with your head facing downward, near your chest. Position your chin either on or near your chest. Slowly lift your head upward. Lift until you are looking straight ahead. Then continue  lifting your head as far back as you can stretch. Hold your head up for 5 seconds. Then slowly lower it to your starting position.  Supine Head Lifts Repeat this exercise 8-10 times. Do this 2 times a day or as told by your health care provider. Lie on your back, bending your knees to point to the ceiling and keeping your feet flat on the floor. Lift your head slowly off the floor, raising your chin toward your chest. Hold for 5 seconds. Relax and repeat.  Scapular Retraction Repeat this exercise 5 times. Do this 2 times a day or as told by your health care provider. Stand with your arms at your sides. Look straight ahead. Slowly pull both shoulders backward and downward until you feel a stretch between your shoulder blades in your upper back. Hold for 10-30 seconds. Relax and repeat.  Contact a health care provider if: Your neck pain or discomfort gets much worse when you do an exercise. Your neck pain or discomfort does not improve within 2 hours after you exercise. If you have any of these problems, stop exercising right away. Do not do the exercises again unless your health care provider says that you can. Get help right away if: You develop sudden, severe neck pain. If this happens, stop exercising right away. Do not do the exercises again unless your health care provider says that you can. Exercises Neck Stretch  Repeat this exercise 3-5 times. Do this exercise while standing or while sitting in a chair. Place your feet flat on the floor, shoulder-width apart. Slowly turn your head to the right. Turn it all the way to the right so you can look over your right shoulder. Do not tilt or tip your head. Hold this position for 10-30 seconds. Slowly turn your head to the left, to look over your left shoulder. Hold this position for 10-30 seconds.  Neck Retraction Repeat this exercise 8-10 times. Do this 3-4 times a day or as told by your health care provider. Do this exercise while  standing or while sitting in a sturdy chair. Look straight ahead. Do not bend your neck. Use your fingers to push your chin backward. Do not bend your neck for this movement. Continue to face straight ahead. If you are doing the exercise properly, you will feel a slight sensation in your throat and a stretch at the back of your neck. Hold the stretch for 1-2 seconds. Relax and repeat.  This information is not intended to replace advice given to you by your health care provider. Make sure you discuss any questions you have with your health care provider. Document Released: 05/20/2015 Document Revised: 11/14/2015 Document Reviewed: 12/17/2014 Elsevier Interactive Patient Education  [employer].

## 2018-05-25 ENCOUNTER — Ambulatory Visit: Payer: BLUE CROSS/BLUE SHIELD | Admitting: Nurse Practitioner

## 2018-06-28 ENCOUNTER — Ambulatory Visit (INDEPENDENT_AMBULATORY_CARE_PROVIDER_SITE_OTHER): Payer: BLUE CROSS/BLUE SHIELD | Admitting: Internal Medicine

## 2018-06-28 ENCOUNTER — Encounter: Payer: Self-pay | Admitting: Internal Medicine

## 2018-06-28 VITALS — BP 138/84 | HR 88 | Temp 97.6°F | Ht 63.4 in | Wt 170.2 lb

## 2018-06-28 DIAGNOSIS — R21 Rash and other nonspecific skin eruption: Secondary | ICD-10-CM | POA: Diagnosis not present

## 2018-06-28 MED ORDER — PREDNISONE 5 MG (21) PO TBPK: ORAL_TABLET | ORAL | 0 refills | Status: DC

## 2018-06-28 NOTE — Progress Notes (Signed)
Subjective:     Patient ID: Evelyn [name] , female    DOB: 24-Feb-1961 , 58 y.o.   MRN: [id number]   Chief Complaint  Patient presents with  . Rash    Patient states she has got a rash for two weeks now the rash is all over her body.pt states she has some welts and bumps.    HPI Onset of rash x 2 weeks which started with just itching on her anterior neck and gradually the itching has been getting worse and this past week itching moved to arms, mid back, and today noticed face itching. Initially the only things new was a new wash cloth that her husband had not washed that she used on areas that are itching and with rash, but since then they have been washed. Denies new meds or foods, or topical things. Has been taking Benadryl which has helped calm the itching, but the rash has not resolved. She does not have a rash on her face.   Past Medical History:  Diagnosis Date  . Anxiety   . Arthritis   . Blood transfusion without reported diagnosis   . 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 Hypertension      Family History  Problem Relation Age of Onset  . Diabetes Mother   . COPD Mother   . Renal Disease Mother   . Heart attack Father 16  . Colon cancer Neg Hx   . Esophageal cancer Neg Hx   . Liver cancer Neg Hx   . Pancreatic cancer Neg Hx   . Rectal cancer Neg Hx   . Stomach cancer Neg Hx      Current Outpatient Medications:  .  hydrocortisone cream 1 %, Apply to affected area 2 times daily, Disp: 30 g, Rfl: 1 .  losartan-hydrochlorothiazide (HYZAAR) 50-12.5 MG tablet, Take 1 tablet by mouth daily., Disp: 30 tablet, Rfl: 2  Current Facility-Administered Medications:  .  0.9 %  sodium chloride infusion, 500 mL, Intravenous, Once, Pyrtle, Lajuan Lines, MD   Allergies  Allergen Reactions  . Lisinopril     Intolerance cough      Review of Systems  Constitutional: Negative for chills, diaphoresis, fatigue and fever.  HENT: Negative.   Respiratory: Negative for cough  and shortness of breath.   Cardiovascular: Negative for chest pain.  Gastrointestinal: Negative for abdominal pain, nausea and vomiting.  Genitourinary: Negative for difficulty urinating.  Musculoskeletal: Negative for arthralgias and myalgias.  Skin: Positive for rash.  Allergic/Immunologic: Negative for environmental allergies and food allergies.  Neurological: Negative for dizziness and headaches.  Hematological: Negative for adenopathy.    Today's Vitals   06/28/18 1524  BP: 138/84  Pulse: 88  Temp: 97.6 F (36.4 C)  TempSrc: Oral  SpO2: 97%  Weight: 170 lb 3.2 oz (77.2 kg)  Height: 5' 3.4" (1.61 m)  PainSc: 0-No pain   Body mass index is 29.77 kg/m.   Objective:  Physical Exam Vitals signs and nursing note reviewed.  Constitutional:      General: She is not in acute distress.    Appearance: Normal appearance. She is obese. She is not toxic-appearing.  HENT:     Head: Normocephalic.     Nose: Nose normal.     Mouth/Throat:     Pharynx: Oropharynx is clear.  Eyes:     General: No scleral icterus.    Conjunctiva/sclera: Conjunctivae normal.  Neck:     Musculoskeletal: Neck supple. No neck rigidity.  Cardiovascular:  Rate and Rhythm: Normal rate and regular rhythm.     Heart sounds: No murmur.  Pulmonary:     Effort: Pulmonary effort is normal.  Musculoskeletal: Normal range of motion.  Lymphadenopathy:     Cervical: No cervical adenopathy.  Skin:    General: Skin is warm and dry.     Findings: Rash present.     Comments: Has a fine papular rash, sandy feeling on anterior neck and upper chest and much milder on her forearms. Nothing on thorax or face seen.   Neurological:     Mental Status: She is alert and oriented to person, place, and time.  Psychiatric:        Mood and Affect: Mood normal.        Behavior: Behavior normal.        Thought Content: Thought content normal.        Judgment: Judgment normal.    Assessment And Plan:  1. Rash- possibly  contact allergic reaction  - CMP14 + Anion Gap - CBC no Diff - Allergens(96) Foods   I will have her try Medrol dose pack. We will inform her of her results when they come back. Avner Stroder RODRIGUEZ-SOUTH[place]TH, PA-C

## 2018-06-30 LAB — ALLERGENS(96) FOODS
Allergen Barley IgE: <0.1 kU/L
Allergen Black Pepper IgE: <0.1 kU/L
Allergen Blueberry IgE: <0.1 kU/L
Allergen Cauliflower IgE: <0.1 kU/L
Allergen Corn, IgE: <0.1 kU/L
Allergen Cucumber IgE: <0.1 kU/L
Allergen Garlic IgE: <0.1 kU/L
Allergen Ginger IgE: <0.1 kU/L
Allergen Gluten IgE: <0.1 kU/L
Allergen Grape IgE: <0.1 kU/L
Allergen Green Pea IgE: <0.1 kU/L
Allergen Lamb IgE: <0.1 kU/L
Allergen Oat IgE: <0.1 kU/L
Allergen Potato, White IgE: <0.1 kU/L
Allergen Rice IgE: <0.1 kU/L
Allergen Strawberry IgE: <0.1 kU/L
Allergen Sweet Potato IgE: <0.1 kU/L
Allergen Watermelon IgE: <0.1 kU/L
Allergen, Peach f95: <0.1 kU/L
Beef IgE: <0.1 kU/L
C074-IgE Gelatin: <0.1 kU/L
Chicken IgE: <0.1 kU/L
Coffee: <0.1 kU/L
EGG WHITE IGE: 0.1 kU/L — AB
F076-IgE Alpha Lactalbumin: <0.1 kU/L
F077-IgE Beta Lactoglobulin: <0.1 kU/L
F080-IgE Lobster: <0.1 kU/L
F081-IgE Cheese, Cheddar Type: <0.1 kU/L
F202-IgE Cashew Nut: <0.1 kU/L
F214-IgE Spinach: <0.1 kU/L
F242-IgE Bing Cherry: <0.1 kU/L
F247-IgE Honey: <0.1 kU/L
F279-IgE Chili Pepper: <0.1 kU/L
F283-IgE Oregano: <0.1 kU/L
F300-IgE Goat's Milk: <0.1 kU/L
Hops: <0.1 kU/L
Lima Bean IgE: <0.1 kU/L
Malt: <0.1 kU/L
Peanut IgE: <0.1 kU/L
Pork IgE: <0.1 kU/L
Pumpkin IgE: <0.1 kU/L
Red Beet: <0.1 kU/L
Rye IgE: <0.1 kU/L
Scallop IgE: <0.1 kU/L
Soybean IgE: <0.1 kU/L
Tuna: <0.1 kU/L
Vanilla: <0.1 kU/L
Wheat IgE: <0.1 kU/L
Whey: <0.1 kU/L
White Bean IgE: <0.1 kU/L

## 2018-06-30 LAB — CMP14 + ANION GAP
A/G RATIO: 1.8 (ref 1.2–2.2)
ALT: 19 IU/L (ref 0–32)
AST: 17 IU/L (ref 0–40)
Albumin: 4.8 g/dL (ref 3.5–5.5)
Alkaline Phosphatase: 62 IU/L (ref 39–117)
Anion Gap: 17 mmol/L (ref 10.0–18.0)
BILIRUBIN TOTAL: 0.5 mg/dL (ref 0.0–1.2)
BUN/Creatinine Ratio: 16 (ref 9–23)
BUN: 11 mg/dL (ref 6–24)
CALCIUM: 10.1 mg/dL (ref 8.7–10.2)
CHLORIDE: 100 mmol/L (ref 96–106)
CO2: 24 mmol/L (ref 20–29)
Creatinine, Ser: 0.69 mg/dL (ref 0.57–1.00)
GFR calc Af Amer: 113 mL/min/{1.73_m2} (ref >59)
GFR, EST NON AFRICAN AMERICAN: 98 mL/min/{1.73_m2} (ref >59)
GLOBULIN, TOTAL: 2.6 g/dL (ref 1.5–4.5)
GLUCOSE: 79 mg/dL (ref 65–99)
POTASSIUM: 3.4 mmol/L — AB (ref 3.5–5.2)
SODIUM: 141 mmol/L (ref 134–144)
Total Protein: 7.4 g/dL (ref 6.0–8.5)

## 2018-06-30 LAB — CBC
HEMOGLOBIN: 14.8 g/dL (ref 11.1–15.9)
Hematocrit: 43.6 % (ref 34.0–46.6)
MCH: 28.8 pg (ref 26.6–33.0)
MCHC: 33.9 g/dL (ref 31.5–35.7)
MCV: 85 fL (ref 79–97)
PLATELETS: 236 10*3/uL (ref 150–450)
RBC: 5.13 x10E6/uL (ref 3.77–5.28)
RDW: 14.4 % (ref 11.7–15.4)
WBC: 6.9 10*3/uL (ref 3.4–10.8)

## 2018-07-12 ENCOUNTER — Other Ambulatory Visit: Payer: Self-pay | Admitting: Nurse Practitioner

## 2018-07-12 DIAGNOSIS — I10 Essential (primary) hypertension: Secondary | ICD-10-CM

## 2018-08-05 ENCOUNTER — Other Ambulatory Visit: Payer: Self-pay | Admitting: Nurse Practitioner

## 2018-08-05 DIAGNOSIS — I10 Essential (primary) hypertension: Secondary | ICD-10-CM

## 2018-08-30 ENCOUNTER — Other Ambulatory Visit: Payer: Self-pay | Admitting: Nurse Practitioner

## 2018-08-30 DIAGNOSIS — I10 Essential (primary) hypertension: Secondary | ICD-10-CM

## 2018-09-14 ENCOUNTER — Ambulatory Visit: Payer: BLUE CROSS/BLUE SHIELD | Admitting: Nurse Practitioner

## 2018-12-05 ENCOUNTER — Other Ambulatory Visit: Payer: Self-pay | Admitting: Nurse Practitioner

## 2018-12-05 DIAGNOSIS — I10 Essential (primary) hypertension: Secondary | ICD-10-CM

## 2018-12-12 ENCOUNTER — Other Ambulatory Visit: Payer: Self-pay

## 2018-12-12 ENCOUNTER — Encounter: Payer: Self-pay | Admitting: Nurse Practitioner

## 2018-12-12 ENCOUNTER — Ambulatory Visit (INDEPENDENT_AMBULATORY_CARE_PROVIDER_SITE_OTHER): Payer: BC Managed Care – PPO | Admitting: Nurse Practitioner

## 2018-12-12 VITALS — BP 160/90 | HR 90 | Temp 98.3°F | Ht 64.2 in | Wt 169.8 lb

## 2018-12-12 DIAGNOSIS — I10 Essential (primary) hypertension: Secondary | ICD-10-CM

## 2018-12-12 DIAGNOSIS — R9431 Abnormal electrocardiogram [ECG] [EKG]: Secondary | ICD-10-CM

## 2018-12-12 MED ORDER — LOSARTAN POTASSIUM-HCTZ 100-25 MG PO TABS: 1.0000 | ORAL_TABLET | Freq: Every day | ORAL | 1 refills | Status: DC

## 2018-12-12 NOTE — Progress Notes (Signed)
Subjective:     Patient ID: Evelyn [name] , female    DOB: [birth year] , 58 y.o.   MRN: [id number]   Chief Complaint  Patient presents with  . Hypertension    HPI  She feels like her blood pressure has still been elevated at 140/90 - working with [employer]   Hypertension This is a chronic problem. The current episode started more than 1 year ago. The problem has been gradually worsening since onset. The problem is uncontrolled (she has been without her blood pressure medications for one week). Pertinent negatives include no anxiety, chest pain, headaches or palpitations.     Past Medical History:  Diagnosis Date  . Anxiety   . Arthritis   . Blood transfusion without reported diagnosis   . 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 Hypertension      Family History  Problem Relation Age of Onset  . Diabetes Mother   . COPD Mother   . Renal Disease Mother   . Heart attack Father 50  . Colon cancer Neg Hx   . Esophageal cancer Neg Hx   . Liver cancer Neg Hx   . Pancreatic cancer Neg Hx   . Rectal cancer Neg Hx   . Stomach cancer Neg Hx      Current Outpatient Medications:  .  losartan-hydrochlorothiazide (HYZAAR) 50-12.5 MG tablet, TAKE 1 TABLET BY MOUTH DAILY. PATIENT NEEDS AN APPOINTMENT, Disp: 30 tablet, Rfl: 0 .  hydrocortisone cream 1 %, Apply to affected area 2 times daily (Patient not taking: Reported on 12/12/2018), Disp: 30 g, Rfl: 1  Current Facility-Administered Medications:  .  0.9 %  sodium chloride infusion, 500 mL, Intravenous, Once, Pyrtle, Lajuan Lines, MD   Allergies  Allergen Reactions  . Lisinopril     Intolerance cough      Review of Systems  Constitutional: Negative.   Respiratory: Negative.  Negative for cough.   Cardiovascular: Negative.  Negative for chest pain, palpitations and leg swelling.  Neurological: Negative for dizziness and headaches.     Today's Vitals   12/12/18 1145  BP: (!) 160/90  Pulse: 90  Temp: 98.3 F (36.8 C)   TempSrc: Oral  Weight: 169 lb 12.8 oz (77 kg)  Height: 5' 4.2" (1.631 m)  PainSc: 0-No pain   Body mass index is 28.96 kg/m.   Objective:  Physical Exam Constitutional:      Appearance: Normal appearance.  Cardiovascular:     Rate and Rhythm: Normal rate and regular rhythm.     Pulses: Normal pulses.     Heart sounds: Normal heart sounds. No murmur.  Pulmonary:     Effort: Pulmonary effort is normal.     Breath sounds: Normal breath sounds.  Skin:    Capillary Refill: Capillary refill takes less than 2 seconds.  Neurological:     General: No focal deficit present.     Mental Status: She is alert and oriented to person, place, and time.  Psychiatric:        Mood and Affect: Mood normal.        Behavior: Behavior normal.        Thought Content: Thought content normal.        Judgment: Judgment normal.          Assessment And Plan:     1. Essential hypertension . B/P is poorly controlled.  . She has been without her medication for one week . CMP ordered to check renal function.  Marland Kitchen  The importance of regular exercise and dietary modification was stressed to the patient.  . Stressed importance of losing ten percent of her body weight to help with B/P control.  . The weight loss would help with decreasing cardiac and cancer risk as well.  - losartan-hydrochlorothiazide (HYZAAR) 100-25 MG tablet; Take 1 tablet by mouth daily.  Dispense: 90 tablet; Refill: 1 - EKG 12-Lead - BMP8+eGFR - Ambulatory referral to Cardiology  2. Abnormal EKG  - Ambulatory referral to Cardiology   [name], FNP    THE PATIENT IS ENCOURAGED TO PRACTICE SOCIAL DISTANCING DUE TO THE COVID-19 PANDEMIC.

## 2018-12-13 ENCOUNTER — Other Ambulatory Visit: Payer: Self-pay | Admitting: Internal Medicine

## 2018-12-13 DIAGNOSIS — Z20822 Contact with and (suspected) exposure to covid-19: Secondary | ICD-10-CM

## 2018-12-13 LAB — BMP8+EGFR
BUN/Creatinine Ratio: 18 (ref 9–23)
BUN: 12 mg/dL (ref 6–24)
CO2: 21 mmol/L (ref 20–29)
Calcium: 9.1 mg/dL (ref 8.7–10.2)
Chloride: 103 mmol/L (ref 96–106)
Creatinine, Ser: 0.68 mg/dL (ref 0.57–1.00)
GFR calc Af Amer: 113 mL/min/{1.73_m2} (ref >59)
GFR calc non Af Amer: 98 mL/min/{1.73_m2} (ref >59)
Glucose: 99 mg/dL (ref 65–99)
Potassium: 3.8 mmol/L (ref 3.5–5.2)
Sodium: 139 mmol/L (ref 134–144)

## 2018-12-16 LAB — NOVEL CORONAVIRUS, NAA: SARS-CoV-2, NAA: NOT DETECTED

## 2018-12-26 ENCOUNTER — Ambulatory Visit: Payer: BC Managed Care – PPO

## 2018-12-26 ENCOUNTER — Other Ambulatory Visit: Payer: Self-pay

## 2018-12-26 VITALS — BP 160/82 | HR 95 | Temp 98.5°F | Ht 64.2 in | Wt 164.8 lb

## 2018-12-26 DIAGNOSIS — I10 Essential (primary) hypertension: Secondary | ICD-10-CM

## 2018-12-26 NOTE — Progress Notes (Signed)
Pt came in for a blood pressure check. Blood pressure medication was recently changed. Pt encouraged to call the office for any concerns

## 2019-01-02 ENCOUNTER — Ambulatory Visit: Payer: Self-pay | Admitting: Cardiology

## 2019-01-02 ENCOUNTER — Ambulatory Visit: Payer: BC Managed Care – PPO | Admitting: Cardiology

## 2019-01-02 ENCOUNTER — Other Ambulatory Visit: Payer: Self-pay

## 2019-01-02 ENCOUNTER — Encounter: Payer: Self-pay | Admitting: Cardiology

## 2019-01-02 VITALS — BP 145/87 | HR 89 | Ht 63.5 in | Wt 167.6 lb

## 2019-01-02 DIAGNOSIS — Z720 Tobacco use: Secondary | ICD-10-CM

## 2019-01-02 DIAGNOSIS — R9431 Abnormal electrocardiogram [ECG] [EKG]: Secondary | ICD-10-CM | POA: Diagnosis not present

## 2019-01-02 DIAGNOSIS — I10 Essential (primary) hypertension: Secondary | ICD-10-CM

## 2019-01-02 MED ORDER — AMLODIPINE BESYLATE 5 MG PO TABS: 5.0000 mg | ORAL_TABLET | Freq: Every day | ORAL | 1 refills | Status: DC

## 2019-01-02 NOTE — Progress Notes (Signed)
Primary Physician:  [name], FNP   Patient ID: [name], female    DOB: 01-11-1961, 58 y.o.   MRN: [id number]  Subjective:    Chief Complaint  Patient presents with  . New Patient (Initial Visit)  . Hypertension  . Abnormal ECG    HPI: Evelyn [name]  is a 58 y.o. female  with hypertension, abdominal aortic calcification, referred to [place] by PCP, [name], NP, for evaluation and management of uncontrolled hypertension and abnormal EKG.  Recent EKG showed newly noted RBBB compared to EKG in April 2019. States she was found to have hypertension around 2003 and was fairly well controlled with medicine and diet changes; however, after losing insurance she was not able to keep regular follow ups or medications. She started back on medication about 1 year ago. Medications were just recently increased 3 weeks ago due to increased systolic readings. Denies any hyperlipidemia or diabetes.  She does smoke 1/2 pack per day for the last 30 years. She has not tried any medications before to help her quit. She is fairly active, and previously walked 5 miles per day, but has not walked much this year. 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  Past Medical History:  Diagnosis Date  . Anxiety   . Arthritis   . Blood transfusion without reported diagnosis   . Calcification of abdominal aorta ([place]) 11/11/2017  . Hypertension     Past Surgical History:  Procedure Laterality Date  . CESAREAN SECTION     x2  . FOOT SURGERY Left    Hammmer Toe  . TUBAL LIGATION      Social History   Socioeconomic History  . Marital status: Married    Spouse name: Not on file  . Number of children: 2  . Years of education: Not on file  . Highest education level: Not on file  Occupational History  . Not on file  Social Needs  . Financial resource strain: Not on file  . Food insecurity    Worry: Not on file    Inability: Not on file  . Transportation needs   Medical: Not on file    Non-medical: Not on file  Tobacco Use  . Smoking status: Current Every Day Smoker    Packs/day: 0.50    Years: 22.00    Pack years: 11.00    Types: Cigarettes  . Smokeless tobacco: Never Used  Substance and Sexual Activity  . Alcohol use: Yes    Comment: beer daily  . Drug use: No  . Sexual activity: Not on file  Lifestyle  . Physical activity    Days per week: Not on file    Minutes per session: Not on file  . Stress: Not on file  Relationships  . Social Herbalist on phone: Not on file    Gets together: Not on file    Attends religious service: Not on file    Active member of club or organization: Not on file    Attends meetings of clubs or organizations: Not on file    Relationship status: Not on file  . Intimate partner violence    Fear of current or ex partner: Not on file    Emotionally abused: Not on file    Physically abused: Not on file    Forced sexual activity: Not on file  Other Topics Concern  . Not on file  Social History Narrative  .  Not on file    Review of Systems  Constitution: Negative for decreased appetite, malaise/fatigue, weight gain and weight loss.  Eyes: Negative for visual disturbance.  Cardiovascular: Negative for chest pain, claudication, dyspnea on exertion, leg swelling, orthopnea, palpitations and syncope.  Respiratory: Negative for hemoptysis and wheezing.   Endocrine: Negative for cold intolerance and heat intolerance.  Hematologic/Lymphatic: Does not bruise/bleed easily.  Skin: Negative for nail changes.  Musculoskeletal: Negative for muscle weakness and myalgias.  Gastrointestinal: Negative for abdominal pain, change in bowel habit, nausea and vomiting.  Neurological: Negative for difficulty with concentration, dizziness, focal weakness and headaches.  Psychiatric/Behavioral: Negative for altered mental status and suicidal ideas.  All other systems reviewed and are negative.     Objective:   Blood pressure (!) 145/87, pulse 89, height 5' 3.5" (1.613 m), weight 167 lb 9.6 oz (76 kg), last menstrual period 07/16/2017, SpO2 95 %. Body mass index is 29.22 kg/m.    Physical Exam  Constitutional: She is oriented to person, place, and time. Vital signs are normal. She appears well-developed and well-nourished.  HENT:  Head: Normocephalic and atraumatic.  Neck: Normal range of motion.  Cardiovascular: Normal rate, regular rhythm, normal heart sounds and intact distal pulses.  Pulses:      Femoral pulses are 2+ on the right side and 2+ on the left side.      Popliteal pulses are 2+ on the right side and 2+ on the left side.  Pulmonary/Chest: Effort normal and breath sounds normal. No accessory muscle usage. No respiratory distress.  Abdominal: Soft. Bowel sounds are normal.  Musculoskeletal: Normal range of motion.  Neurological: She is alert and oriented to person, place, and time.  Skin: Skin is warm and dry.  Vitals reviewed.  Radiology: No results found.  Laboratory examination:    CMP Latest Ref Rng & Units 12/12/2018 06/28/2018 02/02/2018  Glucose 65 - 99 mg/dL 99 79 98  BUN 6 - 24 mg/dL 12 11 13   Creatinine 0.57 - 1.00 mg/dL 0.68 0.69 0.65  Sodium 134 - 144 mmol/L 139 141 139  Potassium 3.5 - 5.2 mmol/L 3.8 3.4(L) 3.5  Chloride 96 - 106 mmol/L 103 100 100  CO2 20 - 29 mmol/L 21 24 26   Calcium 8.7 - 10.2 mg/dL 9.1 10.1 9.3  Total Protein 6.0 - 8.5 g/dL - 7.4 7.2  Total Bilirubin 0.0 - 1.2 mg/dL - 0.5 0.4  Alkaline Phos 39 - 117 IU/L - 62 65  AST 0 - 40 IU/L - 17 17  ALT 0 - 32 IU/L - 19 18   CBC Latest Ref Rng & Units 06/28/2018 02/02/2018 10/11/2017  WBC 3.4 - 10.8 x10E3/uL 6.9 5.7 7.5  Hemoglobin 11.1 - 15.9 g/dL 14.8 14.8 15.2  Hematocrit 34.0 - 46.6 % 43.6 43.2 44.5  Platelets 150 - 450 x10E3/uL 236 251 268   Lipid Panel     Component Value Date/Time   CHOL 177 11/16/2017 1621   TRIG 114 11/16/2017 1621   HDL 83 11/16/2017 1621   CHOLHDL 2.1 11/16/2017 1621    LDLCALC 71 11/16/2017 1621   HEMOGLOBIN A1C No results found for: HGBA1C, MPG TSH Recent Labs    02/02/18 1043  TSH 0.986    PRN Meds:. Medications Discontinued During This Encounter  Medication Reason  . hydrocortisone cream 1 % Patient Preference   Current Meds  Medication Sig  . aspirin 81 MG chewable tablet Chew by mouth daily.  . Biotin 10 MG TABS Take by mouth daily.  Marland Kitchen  cholecalciferol (VITAMIN D3) 25 MCG (1000 UT) tablet Take 1,000 Units by mouth daily.  Marland Kitchen losartan-hydrochlorothiazide (HYZAAR) 100-25 MG tablet Take 1 tablet by mouth daily.  . Multiple Vitamins-Minerals (MULTIVITAMIN WOMENS 50+ ADV PO) Take by mouth daily.  . vitamin B-12 (CYANOCOBALAMIN) 1000 MCG tablet Take 1,000 mcg by mouth daily.   Current Facility-Administered Medications for the 01/02/19 encounter (Office Visit) with Miquel Dunn, NP  Medication  . 0.9 %  sodium chloride infusion    Cardiac Studies:     Assessment:     ICD-10-CM   1. Abnormal EKG  R94.31 EKG 12-Lead    PCV ECHOCARDIOGRAM COMPLETE  2. Essential hypertension  I10 PCV ECHOCARDIOGRAM COMPLETE  3. Tobacco use  Z72.0     EKG 01/02/2019: Normal sinus rhythm at 85 bpm, left atrial enlargement, RBBB. Abnormal EKG.  Recommendations:   Patient with hypertension, ongoing tobacco use, referred to [place] for evaluation of abnormal EKG and hypertension.  Patient has had history of hypertension, and was without medications for a few years due to losing her insurance, but has recently started back on medications and she reports blood pressure has improved.  Her blood pressure does continue to be slightly elevated, we will add amlodipine 5 mg daily.  She has newly noted right bundle branch block compared to EKG in April 2019.  She is fairly active and climbs stairs regularly at her job without any exertional chest pain or shortness of breath.  Suspect right bundle branch block is related to uncontrolled hypertension.  She is without  history of diabetes, hyperlipidemia, or family history of heart disease.  Do not feel that she needs stress testing at this time in view of lack of symptoms.  I will obtain echocardiogram given right bundle branch block and hypertension.  Would recommend continued risk factor modifications.  I have encouraged her to follow low-sodium diet to help with controlling her blood pressure.  No symptoms of claudication and has normal vascular exam.   She will also need risk factor modification with smoking cessation.  I discussed use of Nicorette gum and patches.  She wishes to try Nicorette gum first.  I have also encouraged her to use Ziploc bag technique to help with weaning.  I will continue to monitor her progress.  I'll see her back in 6 weeks to discuss echocardiogram results and follow-up on hypertension.   *I have discussed this case with Dr. Einar Gip and he personally examined the patient and participated in formulating the plan.*   Miquel Dunn, MSN, APRN, FNP-C [facility]. [place] Office: [phone] Fax: [phone]

## 2019-01-06 ENCOUNTER — Ambulatory Visit (INDEPENDENT_AMBULATORY_CARE_PROVIDER_SITE_OTHER): Payer: BC Managed Care – PPO

## 2019-01-06 ENCOUNTER — Other Ambulatory Visit: Payer: Self-pay

## 2019-01-06 DIAGNOSIS — R9431 Abnormal electrocardiogram [ECG] [EKG]: Secondary | ICD-10-CM | POA: Diagnosis not present

## 2019-01-06 DIAGNOSIS — I10 Essential (primary) hypertension: Secondary | ICD-10-CM | POA: Diagnosis not present

## 2019-01-09 NOTE — Progress Notes (Signed)
Pt aware.

## 2019-01-24 ENCOUNTER — Other Ambulatory Visit: Payer: Self-pay | Admitting: Cardiology

## 2019-02-13 ENCOUNTER — Ambulatory Visit (INDEPENDENT_AMBULATORY_CARE_PROVIDER_SITE_OTHER): Payer: BC Managed Care – PPO | Admitting: Cardiology

## 2019-02-13 ENCOUNTER — Encounter: Payer: Self-pay | Admitting: Cardiology

## 2019-02-13 ENCOUNTER — Other Ambulatory Visit: Payer: Self-pay

## 2019-02-13 VITALS — BP 145/81 | HR 85 | Temp 98.2°F | Ht 63.0 in | Wt 161.0 lb

## 2019-02-13 DIAGNOSIS — R9431 Abnormal electrocardiogram [ECG] [EKG]: Secondary | ICD-10-CM

## 2019-02-13 DIAGNOSIS — Z720 Tobacco use: Secondary | ICD-10-CM

## 2019-02-13 DIAGNOSIS — I10 Essential (primary) hypertension: Secondary | ICD-10-CM

## 2019-02-13 MED ORDER — AMLODIPINE BESYLATE 10 MG PO TABS: 10.0000 mg | ORAL_TABLET | Freq: Every day | ORAL | 2 refills | Status: DC

## 2019-02-13 NOTE — Progress Notes (Signed)
Primary Physician:  [name], FNP   Patient ID: Evelyn [name], female    DOB: 10-13-1960, 58 y.o.   MRN: [id number]  Subjective:    Chief Complaint  Patient presents with  . Hypertension  . Abnormal ECG  . Results    echo  . Follow-up    6 weeks    HPI: Evelyn [name]  is a 58 y.o. female  with hypertension, abdominal aortic calcification,recently evaluated by [place] for uncontrolled hypertension and abnormal EKG.   Recent EKG showed newly noted RBBB compared to EKG in April 2019. Blood pressure was markedly elevated at her last office visit, she was started on amlodipine and losartan HCT at her last office visit. She underwent echocardiogram and now presents for follow up.  No symptoms of chest pain or dyspnea on exertion. Denies any hyperlipidemia or diabetes.  She does smoke 1/2 pack per day for the last 30 years. She has not tried any medications before to help her quit. She is fairly active, and previously walked 5 miles per day, but has not walked much this year. 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  Past Medical History:  Diagnosis Date  . Anxiety   . Arthritis   . Blood transfusion without reported diagnosis   . Calcification of abdominal aorta ([place]) 11/11/2017  . Hypertension     Past Surgical History:  Procedure Laterality Date  . CESAREAN SECTION     x2  . FOOT SURGERY Left    Hammmer Toe  . TUBAL LIGATION      Social History   Socioeconomic History  . Marital status: Married    Spouse name: Not on file  . Number of children: 2  . Years of education: Not on file  . Highest education [name]: Not on file  Occupational History  . Not on file  Social Needs  . Financial resource strain: Not on file  . Food insecurity    Worry: Not on file    Inability: Not on file  . Transportation needs    Medical: Not on file    Non-medical: Not on file  Tobacco Use  . Smoking status: Current Every Day Smoker    Packs/day:  0.50    Years: 22.00    Pack years: 11.00    Types: Cigarettes  . Smokeless tobacco: Never Used  Substance and Sexual Activity  . Alcohol use: Yes    Comment: beer daily  . Drug use: No  . Sexual activity: Not on file  Lifestyle  . Physical activity    Days per week: Not on file    Minutes per session: Not on file  . Stress: Not on file  Relationships  . Social Herbalist on phone: Not on file    Gets together: Not on file    Attends religious service: Not on file    Active member of club or organization: Not on file    Attends meetings of clubs or organizations: Not on file    Relationship status: Not on file  . Intimate partner violence    Fear of current or ex partner: Not on file    Emotionally abused: Not on file    Physically abused: Not on file    Forced sexual activity: Not on file  Other Topics Concern  . Not on file  Social History Narrative  . Not on file    Review of Systems  Constitution: Negative for decreased appetite, malaise/fatigue, weight gain and weight loss.  Eyes: Negative for visual disturbance.  Cardiovascular: Negative for chest pain, claudication, dyspnea on exertion, leg swelling, orthopnea, palpitations and syncope.  Respiratory: Negative for hemoptysis and wheezing.   Endocrine: Negative for cold intolerance and heat intolerance.  Hematologic/Lymphatic: Does not bruise/bleed easily.  Skin: Negative for nail changes.  Musculoskeletal: Negative for muscle weakness and myalgias.  Gastrointestinal: Negative for abdominal pain, change in bowel habit, nausea and vomiting.  Neurological: Negative for difficulty with concentration, dizziness, focal weakness and headaches.  Psychiatric/Behavioral: Negative for altered mental status and suicidal ideas.  All other systems reviewed and are negative.     Objective:  Blood pressure (!) 145/81, pulse 85, temperature 98.2 F (36.8 C), height 5\' 3"  (1.6 m), weight 161 lb (73 kg), last menstrual  period 07/16/2017, SpO2 96 %. Body mass index is 28.52 kg/m.    Physical Exam  Constitutional: She is oriented to person, place, and time. Vital signs are normal. She appears well-developed and well-nourished.  HENT:  Head: Normocephalic and atraumatic.  Neck: Normal range of motion.  Cardiovascular: Normal rate, regular rhythm, normal heart sounds and intact distal pulses.  Pulses:      Femoral pulses are 2+ on the right side and 2+ on the left side.      Popliteal pulses are 2+ on the right side and 2+ on the left side.  Pulmonary/Chest: Effort normal and breath sounds normal. No accessory muscle usage. No respiratory distress.  Abdominal: Soft. Bowel sounds are normal.  Musculoskeletal: Normal range of motion.  Neurological: She is alert and oriented to person, place, and time.  Skin: Skin is warm and dry.  Vitals reviewed.  Radiology: No results found.  Laboratory examination:    CMP Latest Ref Rng & Units 12/12/2018 06/28/2018 02/02/2018  Glucose 65 - 99 mg/dL 99 79 98  BUN 6 - 24 mg/dL 12 11 13   Creatinine 0.57 - 1.00 mg/dL 0.68 0.69 0.65  Sodium 134 - 144 mmol/L 139 141 139  Potassium 3.5 - 5.2 mmol/L 3.8 3.4(L) 3.5  Chloride 96 - 106 mmol/L 103 100 100  CO2 20 - 29 mmol/L 21 24 26   Calcium 8.7 - 10.2 mg/dL 9.1 10.1 9.3  Total Protein 6.0 - 8.5 g/dL - 7.4 7.2  Total Bilirubin 0.0 - 1.2 mg/dL - 0.5 0.4  Alkaline Phos 39 - 117 IU/L - 62 65  AST 0 - 40 IU/L - 17 17  ALT 0 - 32 IU/L - 19 18   CBC Latest Ref Rng & Units 06/28/2018 02/02/2018 10/11/2017  WBC 3.4 - 10.8 x10E3/uL 6.9 5.7 7.5  Hemoglobin 11.1 - 15.9 g/dL 14.8 14.8 15.2  Hematocrit 34.0 - 46.6 % 43.6 43.2 44.5  Platelets 150 - 450 x10E3/uL 236 251 268   Lipid Panel     Component Value Date/Time   CHOL 177 11/16/2017 1621   TRIG 114 11/16/2017 1621   HDL 83 11/16/2017 1621   CHOLHDL 2.1 11/16/2017 1621   LDLCALC 71 11/16/2017 1621   HEMOGLOBIN A1C No results found for: HGBA1C, MPG TSH No results for  input(s): TSH in the last 8760 hours.  PRN Meds:. Medications Discontinued During This Encounter  Medication Reason  . acetaminophen (TYLENOL) 325 MG tablet Dose change  . amLODipine (NORVASC) 5 MG tablet Dose change   Current Meds  Medication Sig  . aspirin 81 MG chewable tablet Chew by mouth daily.  . Biotin 10 MG TABS Take by mouth  daily.  . cholecalciferol (VITAMIN D3) 25 MCG (1000 UT) tablet Take 1,000 Units by mouth daily.  Marland Kitchen losartan-hydrochlorothiazide (HYZAAR) 100-25 MG tablet Take 1 tablet by mouth daily.  . Multiple Vitamins-Minerals (MULTIVITAMIN WOMENS 50+ ADV PO) Take by mouth daily.  . vitamin B-12 (CYANOCOBALAMIN) 1000 MCG tablet Take 1,000 mcg by mouth daily.  . [DISCONTINUED] acetaminophen (TYLENOL) 325 MG tablet Take 325 mg by mouth as needed.  . [DISCONTINUED] amLODipine (NORVASC) 5 MG tablet TAKE 1 TABLET BY MOUTH EVERY DAY   Current Facility-Administered Medications for the 02/13/19 encounter (Office Visit) with Miquel Dunn, NP  Medication  . 0.9 %  sodium chloride infusion    Cardiac Studies:   Echocardiogram 01/06/2019: Normal LV systolic function with EF 55%. Left ventricle cavity is normal in size. Moderate concentric hypertrophy of the left ventricle. Normal global wall motion. Doppler evidence of grade I (impaired) diastolic dysfunction, normal LAP.  No significant valvular abnormalities.  Inadequate TR jet to estimate pulmonary artery systolic pressure. Normal right atrial pressure.   Assessment:     ICD-10-CM   1. Essential hypertension  I10   2. Abnormal EKG  R94.31   3. Tobacco use  Z72.0     EKG 01/02/2019: Normal sinus rhythm at 85 bpm, left atrial enlargement, RBBB. Abnormal EKG.  Recommendations:   Patient with hypertension, ongoing tobacco use, with new onset RBBB and uncontrolled hypertension.   Blood pressure has significantly improved with recent medication changes and she is tolerating medications well. I have discussed  echocardiogram results, normal LVEF, moderate LVH and grade 1 diastolic dysfunction. I have recommended continued aggressive BP control given these findings. Her blood pressure does continue to be elevated, I will increase her amlodipine up to 10 mg daily. Encouraged her to continue with diet modifications to help with controlling her blood pressure as well as helping with weight loss. Continue to feel that she does not need stress testing in view of lack of symptoms and other risk factors.   She has been able to significantly cut back on cigarette use, encouraged her to continue with this. I will see her back in 8 weeks for follow up on hypertension.    Miquel Dunn, MSN, APRN, FNP-C [facility]. [place] Office: [phone] Fax: [phone]

## 2019-02-16 ENCOUNTER — Other Ambulatory Visit: Payer: Self-pay | Admitting: Cardiology

## 2019-03-06 ENCOUNTER — Telehealth: Payer: Self-pay

## 2019-03-06 NOTE — Telephone Encounter (Signed)
Patient called requesting a call back. [phone]  I HAVE RETURNED PT CALL AND LEFT HER V/M TO CALL THE OFFICE. Evelyn [name]

## 2019-03-15 ENCOUNTER — Encounter: Payer: Self-pay | Admitting: Nurse Practitioner

## 2019-03-15 ENCOUNTER — Ambulatory Visit (INDEPENDENT_AMBULATORY_CARE_PROVIDER_SITE_OTHER): Payer: BC Managed Care – PPO | Admitting: Nurse Practitioner

## 2019-03-15 ENCOUNTER — Other Ambulatory Visit: Payer: Self-pay

## 2019-03-15 VITALS — BP 122/82 | HR 85 | Temp 98.8°F | Ht 63.6 in | Wt 162.2 lb

## 2019-03-15 DIAGNOSIS — Z0000 Encounter for general adult medical examination without abnormal findings: Secondary | ICD-10-CM | POA: Diagnosis not present

## 2019-03-15 DIAGNOSIS — I10 Essential (primary) hypertension: Secondary | ICD-10-CM | POA: Diagnosis not present

## 2019-03-15 DIAGNOSIS — I700 Atherosclerosis of aorta: Secondary | ICD-10-CM | POA: Diagnosis not present

## 2019-03-15 DIAGNOSIS — Z720 Tobacco use: Secondary | ICD-10-CM

## 2019-03-15 LAB — POCT URINALYSIS DIPSTICK
Bilirubin, UA: NEGATIVE
Blood, UA: NEGATIVE
Glucose, UA: NEGATIVE
Ketones, UA: NEGATIVE
Leukocytes, UA: NEGATIVE
Nitrite, UA: NEGATIVE
Protein, UA: NEGATIVE
Spec Grav, UA: 1.02 (ref 1.010–1.025)
Urobilinogen, UA: 0.2 E.U./dL
pH, UA: 7 (ref 5.0–8.0)

## 2019-03-15 LAB — POCT UA - MICROALBUMIN
Albumin/Creatinine Ratio, Urine, POC: 30
Creatinine, POC: 50 mg/dL
Microalbumin Ur, POC: 10 mg/L

## 2019-03-15 NOTE — Progress Notes (Signed)
Subjective:     Patient ID: Evelyn [name] , female    DOB: 1960/08/26 , 58 y.o.   MRN: [id number]   Chief Complaint  Patient presents with  . Annual Exam    HPI  HPI  The patient states she uses post menopausal status for birth control. Last LMP was Patient's last menstrual period was 07/16/2017.. Negative for Dysmenorrhea and Negative for Menorrhagia Mammogram last done this month.  Negative for: breast discharge, breast lump(s), breast pain and breast self exam.  Pertinent negatives include abnormal bleeding (hematology), anxiety, decreased libido, depression, difficulty falling sleep, dyspareunia, history of infertility, nocturia, sexual dysfunction, sleep disturbances, urinary incontinence, urinary urgency, vaginal discharge and vaginal itching. Diet regular. The patient states her exercise [name] is  minimal - due to the Pandemic. She is drinking up to 1 gallon of water a day.    Wt Readings from Last 3 Encounters:  03/15/19 162 lb 3.2 oz (73.6 kg)  02/13/19 161 lb (73 kg)  01/02/19 167 lb 9.6 oz (76 kg)         The patient's tobacco use is:  Social History   Tobacco Use  Smoking Status Current Every Day Smoker  . Packs/day: 0.50  . Years: 22.00  . Pack years: 11.00  . Types: Cigarettes  Smokeless Tobacco Never Used   She has been exposed to passive smoke. The patient's alcohol use is:  Social History   Substance and Sexual Activity  Alcohol Use Yes   Comment: beer daily   Additional information:.  Dr. Gertie Fey did her PAP this month.    Past Medical History:  Diagnosis Date  . Anxiety   . Arthritis   . Blood transfusion without reported diagnosis   . Calcification of abdominal aorta ([place]) 11/11/2017  . Hypertension      Family History  Problem Relation Age of Onset  . Diabetes Mother   . COPD Mother   . Renal Disease Mother   . Heart attack Father 41  . Stroke Brother   . Colon cancer Neg Hx   . Esophageal cancer Neg Hx   . Liver cancer Neg Hx   .  Pancreatic cancer Neg Hx   . Rectal cancer Neg Hx   . Stomach cancer Neg Hx      Current Outpatient Medications:  .  amLODipine (NORVASC) 10 MG tablet, Take 1 tablet (10 mg total) by mouth daily., Disp: 30 tablet, Rfl: 2 .  aspirin 81 MG chewable tablet, Chew by mouth daily., Disp: , Rfl:  .  Biotin 10 MG TABS, Take by mouth daily., Disp: , Rfl:  .  cholecalciferol (VITAMIN D3) 25 MCG (1000 UT) tablet, Take 1,000 Units by mouth daily., Disp: , Rfl:  .  losartan-hydrochlorothiazide (HYZAAR) 100-25 MG tablet, Take 1 tablet by mouth daily., Disp: 90 tablet, Rfl: 1 .  Multiple Vitamins-Minerals (MULTIVITAMIN WOMENS 50+ ADV PO), Take by mouth daily., Disp: , Rfl:  .  vitamin B-12 (CYANOCOBALAMIN) 1000 MCG tablet, Take 1,000 mcg by mouth daily., Disp: , Rfl:   Current Facility-Administered Medications:  .  0.9 %  sodium chloride infusion, 500 mL, Intravenous, Once, Pyrtle, Lajuan Lines, MD   Allergies  Allergen Reactions  . Lisinopril     Intolerance cough      Review of Systems  Constitutional: Negative.   HENT: Negative.   Eyes: Negative.   Respiratory: Negative.   Cardiovascular: Negative.   Gastrointestinal: Negative.   Endocrine: Negative.   Genitourinary: Negative.   Musculoskeletal: Negative.  Skin: Negative.   Allergic/Immunologic: Negative.   Neurological: Negative.  Negative for dizziness and headaches.  Hematological: Negative.   Psychiatric/Behavioral: Negative.      Today's Vitals   03/15/19 0925  BP: 122/82  Pulse: 85  Temp: 98.8 F (37.1 C)  TempSrc: Oral  Weight: 162 lb 3.2 oz (73.6 kg)  Height: 5' 3.6" (1.615 m)  PainSc: 0-No pain   Body mass index is 28.19 kg/m.   Objective:  Physical Exam Constitutional:      General: She is not in acute distress.    Appearance: Normal appearance. She is well-developed.  HENT:     Head: Normocephalic and atraumatic.     Right Ear: Hearing, tympanic membrane, ear canal and external ear normal.     Left Ear:  Hearing, tympanic membrane, ear canal and external ear normal.     Nose: Nose normal.     Mouth/Throat:     Mouth: Mucous membranes are moist.  Eyes:     General: Lids are normal.     Conjunctiva/sclera: Conjunctivae normal.     Pupils: Pupils are equal, round, and reactive to light.     Funduscopic exam:    Right eye: No papilledema.        Left eye: No papilledema.  Neck:     Musculoskeletal: Full passive range of motion without pain, normal range of motion and neck supple.     Thyroid: No thyroid mass.     Vascular: No carotid bruit.  Cardiovascular:     Rate and Rhythm: Normal rate and regular rhythm.     Pulses: Normal pulses.     Heart sounds: Normal heart sounds. No murmur.  Pulmonary:     Effort: Pulmonary effort is normal. No respiratory distress.     Breath sounds: Normal breath sounds.  Abdominal:     General: Abdomen is flat. Bowel sounds are normal.     Palpations: Abdomen is soft.  Musculoskeletal: Normal range of motion.        General: No swelling.     Right lower leg: No edema.     Left lower leg: No edema.  Skin:    General: Skin is warm and dry.     Capillary Refill: Capillary refill takes less than 2 seconds.  Neurological:     General: No focal deficit present.     Mental Status: She is alert and oriented to person, place, and time.     Cranial Nerves: No cranial nerve deficit.     Sensory: No sensory deficit.  Psychiatric:        Mood and Affect: Mood normal.        Behavior: Behavior normal.        Thought Content: Thought content normal.        Judgment: Judgment normal.         Assessment And Plan:     1. Health maintenance examination . Behavior modifications discussed and diet history reviewed.   . Pt will continue to exercise regularly and modify diet with low GI, plant based foods and decrease intake of processed foods.  . Recommend intake of daily multivitamin, Vitamin D, and calcium.  . Recommend mammogram for preventive screenings,  as well as recommend immunizations that include influenza, TDAP  2. Essential hypertension . B/P is better controlled.  . CMP ordered to check renal function.  . The importance of regular exercise and dietary modification was stressed to the patient.  . Stressed importance of losing ten  percent of her body weight to help with B/P control.  - POCT Urinalysis Dipstick (81002) - POCT UA - Microalbumin - EKG 12-Lead  3. Calcification of abdominal aorta ([facility])  Will check lipid panel  4. Tobacco abuse  Ready to quit: No  Counseling given: Yes  Smoking cessation instruction/counseling given:  counseled patient on the dangers of tobacco use, advised patient to stop smoking, and reviewed strategies to maximize success   [name], FNP    THE PATIENT IS ENCOURAGED TO PRACTICE SOCIAL DISTANCING DUE TO THE COVID-19 PANDEMIC.

## 2019-03-15 NOTE — Patient Instructions (Signed)
Health Maintenance  Topic Date Due  . TETANUS/TDAP  06/16/1981  . PAP SMEAR-Modifier  06/17/1983  . MAMMOGRAM  06/16/2012  . INFLUENZA VACCINE  09/20/2019 (Originally 01/21/2019)  . COLONOSCOPY  12/29/2022  . Hepatitis C Screening  Completed  . HIV Screening  Completed   Health Maintenance, Female Adopting a healthy lifestyle and getting preventive care are important in promoting health and wellness. Ask your health care provider about:  The right schedule for you to have regular tests and exams.  Things you can do on your own to prevent diseases and keep yourself healthy. What should I know about diet, weight, and exercise? Eat a healthy diet   Eat a diet that includes plenty of vegetables, fruits, low-fat dairy products, and lean protein.  Do not eat a lot of foods that are high in solid fats, added sugars, or sodium. Maintain a healthy weight Body mass index (BMI) is used to identify weight problems. It estimates body fat based on height and weight. Your health care provider can help determine your BMI and help you achieve or maintain a healthy weight. Get regular exercise Get regular exercise. This is one of the most important things you can do for your health. Most adults should:  Exercise for at least 150 minutes each week. The exercise should increase your heart rate and make you sweat (moderate-intensity exercise).  Do strengthening exercises at least twice a week. This is in addition to the moderate-intensity exercise.  Spend less time sitting. Even light physical activity can be beneficial. Watch cholesterol and blood lipids Have your blood tested for lipids and cholesterol at 57 years of age, then have this test every 5 years. Have your cholesterol levels checked more often if:  Your lipid or cholesterol levels are high.  You are older than 57 years of age.  You are at high risk for heart disease. What should I know about cancer screening? Depending on your health  history and family history, you may need to have cancer screening at various ages. This may include screening for:  Breast cancer.  Cervical cancer.  Colorectal cancer.  Skin cancer.  Lung cancer. What should I know about heart disease, diabetes, and high blood pressure? Blood pressure and heart disease  High blood pressure causes heart disease and increases the risk of stroke. This is more likely to develop in people who have high blood pressure readings, are of African descent, or are overweight.  Have your blood pressure checked: ? Every 3-5 years if you are 50-13 years of age. ? Every year if you are 32 years old or older. Diabetes Have regular diabetes screenings. This checks your fasting blood sugar level. Have the screening done:  Once every three years after age 49 if you are at a normal weight and have a low risk for diabetes.  More often and at a younger age if you are overweight or have a high risk for diabetes. What should I know about preventing infection? Hepatitis B If you have a higher risk for hepatitis B, you should be screened for this virus. Talk with your health care provider to find out if you are at risk for hepatitis B infection. Hepatitis C Testing is recommended for:  Everyone born from [birth year] through 1964.  Anyone with known risk factors for hepatitis C. Sexually transmitted infections (STIs)  Get screened for STIs, including gonorrhea and chlamydia, if: ? You are sexually active and are younger than 57 years of age. ? You are  older than 57 years of age and your health care provider tells you that you are at risk for this type of infection. ? Your sexual activity has changed since you were last screened, and you are at increased risk for chlamydia or gonorrhea. Ask your health care provider if you are at risk.  Ask your health care provider about whether you are at high risk for HIV. Your health care provider may recommend a prescription medicine to  help prevent HIV infection. If you choose to take medicine to prevent HIV, you should first get tested for HIV. You should then be tested every 3 months for as long as you are taking the medicine. Pregnancy  If you are about to stop having your period (premenopausal) and you may become pregnant, seek counseling before you get pregnant.  Take 400 to 800 micrograms (mcg) of folic acid every day if you become pregnant.  Ask for birth control (contraception) if you want to prevent pregnancy. Osteoporosis and menopause Osteoporosis is a disease in which the bones lose minerals and strength with aging. This can result in bone fractures. If you are 52 years old or older, or if you are at risk for osteoporosis and fractures, ask your health care provider if you should:  Be screened for bone loss.  Take a calcium or vitamin D supplement to lower your risk of fractures.  Be given hormone replacement therapy (HRT) to treat symptoms of menopause. Follow these instructions at home: Lifestyle  Do not use any products that contain nicotine or tobacco, such as cigarettes, e-cigarettes, and chewing tobacco. If you need help quitting, ask your health care provider.  Do not use street drugs.  Do not share needles.  Ask your health care provider for help if you need support or information about quitting drugs. Alcohol use  Do not drink alcohol if: ? Your health care provider tells you not to drink. ? You are pregnant, may be pregnant, or are planning to become pregnant.  If you drink alcohol: ? Limit how much you use to 0-1 drink a day. ? Limit intake if you are breastfeeding.  Be aware of how much alcohol is in your drink. In the [place] one drink equals one 12 oz bottle of beer (355 mL), one 5 oz glass of wine (148 mL), or one 1 oz glass of hard liquor (44 mL). General instructions  Schedule regular health, dental, and eye exams.  Stay current with your vaccines.  Tell your health care  provider if: ? You often feel depressed. ? You have ever been abused or do not feel safe at home. Summary  Adopting a healthy lifestyle and getting preventive care are important in promoting health and wellness.  Follow your health care provider's instructions about healthy diet, exercising, and getting tested or screened for diseases.  Follow your health care provider's instructions on monitoring your cholesterol and blood pressure. This information is not intended to replace advice given to you by your health care provider. Make sure you discuss any questions you have with your health care provider. Document Released: 12/22/2010 Document Revised: 06/01/2018 Document Reviewed: 06/01/2018 Elsevier Patient Education  2020 [employer].

## 2019-03-16 LAB — LIPID PANEL
Chol/HDL Ratio: 2.8 ratio (ref 0.0–4.4)
Cholesterol, Total: 220 mg/dL — ABNORMAL HIGH (ref 100–199)
HDL: 78 mg/dL (ref >39)
LDL Chol Calc (NIH): 126 mg/dL — ABNORMAL HIGH (ref 0–99)
Triglycerides: 89 mg/dL (ref 0–149)
VLDL Cholesterol Cal: 16 mg/dL (ref 5–40)

## 2019-03-16 LAB — CBC
Hematocrit: 44 % (ref 34.0–46.6)
Hemoglobin: 14.6 g/dL (ref 11.1–15.9)
MCH: 28.8 pg (ref 26.6–33.0)
MCHC: 33.2 g/dL (ref 31.5–35.7)
MCV: 87 fL (ref 79–97)
Platelets: 263 10*3/uL (ref 150–450)
RBC: 5.07 x10E6/uL (ref 3.77–5.28)
RDW: 14.7 % (ref 11.7–15.4)
WBC: 8.3 10*3/uL (ref 3.4–10.8)

## 2019-03-16 LAB — CMP14 + ANION GAP
ALT: 21 IU/L (ref 0–32)
AST: 17 IU/L (ref 0–40)
Albumin/Globulin Ratio: 2 (ref 1.2–2.2)
Albumin: 4.9 g/dL (ref 3.8–4.9)
Alkaline Phosphatase: 65 IU/L (ref 39–117)
Anion Gap: 16 mmol/L (ref 10.0–18.0)
BUN/Creatinine Ratio: 19 (ref 9–23)
BUN: 13 mg/dL (ref 6–24)
Bilirubin Total: 0.3 mg/dL (ref 0.0–1.2)
CO2: 25 mmol/L (ref 20–29)
Calcium: 9.8 mg/dL (ref 8.7–10.2)
Chloride: 98 mmol/L (ref 96–106)
Creatinine, Ser: 0.68 mg/dL (ref 0.57–1.00)
GFR calc Af Amer: 113 mL/min/{1.73_m2} (ref >59)
GFR calc non Af Amer: 98 mL/min/{1.73_m2} (ref >59)
Globulin, Total: 2.4 g/dL (ref 1.5–4.5)
Glucose: 100 mg/dL — ABNORMAL HIGH (ref 65–99)
Potassium: 4.1 mmol/L (ref 3.5–5.2)
Sodium: 139 mmol/L (ref 134–144)
Total Protein: 7.3 g/dL (ref 6.0–8.5)

## 2019-03-16 LAB — HEMOGLOBIN A1C
Est. average glucose Bld gHb Est-mCnc: 126 mg/dL
Hgb A1c MFr Bld: 6 % — ABNORMAL HIGH (ref 4.8–5.6)

## 2019-03-16 LAB — VITAMIN D 25 HYDROXY (VIT D DEFICIENCY, FRACTURES): Vit D, 25-Hydroxy: 30.4 ng/mL (ref 30.0–100.0)

## 2019-04-14 ENCOUNTER — Encounter: Payer: Self-pay | Admitting: Cardiology

## 2019-04-17 ENCOUNTER — Ambulatory Visit: Payer: BC Managed Care – PPO | Admitting: Cardiology

## 2019-04-17 ENCOUNTER — Other Ambulatory Visit: Payer: Self-pay

## 2019-04-17 ENCOUNTER — Encounter: Payer: Self-pay | Admitting: Cardiology

## 2019-04-17 VITALS — BP 147/80 | HR 87 | Ht 63.0 in | Wt 162.9 lb

## 2019-04-17 DIAGNOSIS — Z720 Tobacco use: Secondary | ICD-10-CM | POA: Diagnosis not present

## 2019-04-17 DIAGNOSIS — I10 Essential (primary) hypertension: Secondary | ICD-10-CM

## 2019-04-17 MED ORDER — NEBIVOLOL HCL 5 MG PO TABS: 5.0000 mg | ORAL_TABLET | Freq: Every day | ORAL | 1 refills | Status: DC

## 2019-04-17 NOTE — Progress Notes (Signed)
Primary Physician:  [name], FNP   Patient ID: Evelyn [name], female    DOB: Nov 15, 1960, 58 y.o.   MRN: [id number]  Subjective:    Chief Complaint  Patient presents with  . Hypertension  . Obesity  . Follow-up    74mo    HPI: NIMRA LASS  is a 58 y.o. female  with hypertension, abdominal aortic calcification, recently evaluated by [place] for uncontrolled hypertension and abnormal EKG.   Recent EKG showed newly noted RBBB compared to EKG in April 2019. Blood pressure has improved with amlodipine and losartan HCT. She underwent echocardiogram on 01/06/19 that revealed normal LVEF, moderate LVH, and grade 1 diastolic dysfunction.   At her last visit, amlodipine was increased. Blood pressure has continued to be elevated. No symptoms of chest pain or dyspnea on exertion. Denies any hyperlipidemia or diabetes.  She does smoke 1/2 pack per day for the last 30 years, but has recently been able to cut back to less than this. She has not tried any medications before to help her quit. She is fairly active, and previously walked 5 miles per day, but has not walked much this year. 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  Past Medical History:  Diagnosis Date  . Anxiety   . Arthritis   . Blood transfusion without reported diagnosis   . Calcification of abdominal aorta ([place]) 11/11/2017  . Hypertension     Past Surgical History:  Procedure Laterality Date  . CESAREAN SECTION     x2  . FOOT SURGERY Left    Hammmer Toe  . TUBAL LIGATION      Social History   Socioeconomic History  . Marital status: Married    Spouse name: Not on file  . Number of children: 2  . Years of education: Not on file  . Highest education [name]: Not on file  Occupational History  . Not on file  Social Needs  . Financial resource strain: Not on file  . Food insecurity    Worry: Not on file    Inability: Not on file  . Transportation needs    Medical: Not on file   Non-medical: Not on file  Tobacco Use  . Smoking status: Current Every Day Smoker    Packs/day: 0.25    Years: 22.00    Pack years: 5.50    Types: Cigarettes  . Smokeless tobacco: Never Used  Substance and Sexual Activity  . Alcohol use: Yes    Comment: beer daily  . Drug use: No  . Sexual activity: Not on file  Lifestyle  . Physical activity    Days per week: Not on file    Minutes per session: Not on file  . Stress: Not on file  Relationships  . Social Herbalist on phone: Not on file    Gets together: Not on file    Attends religious service: Not on file    Active member of club or organization: Not on file    Attends meetings of clubs or organizations: Not on file    Relationship status: Not on file  . Intimate partner violence    Fear of current or ex partner: Not on file    Emotionally abused: Not on file    Physically abused: Not on file    Forced sexual activity: Not on file  Other Topics Concern  . Not on file  Social History Narrative  .  Not on file    Review of Systems  Constitution: Negative for decreased appetite, malaise/fatigue, weight gain and weight loss.  Eyes: Negative for visual disturbance.  Cardiovascular: Negative for chest pain, claudication, dyspnea on exertion, leg swelling, orthopnea, palpitations and syncope.  Respiratory: Negative for hemoptysis and wheezing.   Endocrine: Negative for cold intolerance and heat intolerance.  Hematologic/Lymphatic: Does not bruise/bleed easily.  Skin: Negative for nail changes.  Musculoskeletal: Negative for muscle weakness and myalgias.  Gastrointestinal: Negative for abdominal pain, change in bowel habit, nausea and vomiting.  Neurological: Negative for difficulty with concentration, dizziness, focal weakness and headaches.  Psychiatric/Behavioral: Negative for altered mental status and suicidal ideas.  All other systems reviewed and are negative.     Objective:  Blood pressure (!) 147/80,  pulse 87, height 5\' 3"  (1.6 m), weight 162 lb 14.4 oz (73.9 kg), last menstrual period 07/16/2017, SpO2 99 %. Body mass index is 28.86 kg/m.    Physical Exam  Constitutional: She is oriented to person, place, and time. Vital signs are normal. She appears well-developed and well-nourished.  HENT:  Head: Normocephalic and atraumatic.  Neck: Normal range of motion.  Cardiovascular: Normal rate, regular rhythm, normal heart sounds and intact distal pulses.  Pulses:      Femoral pulses are 2+ on the right side and 2+ on the left side.      Popliteal pulses are 2+ on the right side and 2+ on the left side.  Pulmonary/Chest: Effort normal and breath sounds normal. No accessory muscle usage. No respiratory distress.  Abdominal: Soft. Bowel sounds are normal.  Musculoskeletal: Normal range of motion.  Neurological: She is alert and oriented to person, place, and time.  Skin: Skin is warm and dry.  Vitals reviewed.  Radiology: No results found.  Laboratory examination:    CMP Latest Ref Rng & Units 03/15/2019 12/12/2018 06/28/2018  Glucose 65 - 99 mg/dL 100(H) 99 79  BUN 6 - 24 mg/dL 13 12 11   Creatinine 0.57 - 1.00 mg/dL 0.68 0.68 0.69  Sodium 134 - 144 mmol/L 139 139 141  Potassium 3.5 - 5.2 mmol/L 4.1 3.8 3.4(L)  Chloride 96 - 106 mmol/L 98 103 100  CO2 20 - 29 mmol/L 25 21 24   Calcium 8.7 - 10.2 mg/dL 9.8 9.1 10.1  Total Protein 6.0 - 8.5 g/dL 7.3 - 7.4  Total Bilirubin 0.0 - 1.2 mg/dL 0.3 - 0.5  Alkaline Phos 39 - 117 IU/L 65 - 62  AST 0 - 40 IU/L 17 - 17  ALT 0 - 32 IU/L 21 - 19   CBC Latest Ref Rng & Units 03/15/2019 06/28/2018 02/02/2018  WBC 3.4 - 10.8 x10E3/uL 8.3 6.9 5.7  Hemoglobin 11.1 - 15.9 g/dL 14.6 14.8 14.8  Hematocrit 34.0 - 46.6 % 44.0 43.6 43.2  Platelets 150 - 450 x10E3/uL 263 236 251   Lipid Panel     Component Value Date/Time   CHOL 220 (H) 03/15/2019 1029   TRIG 89 03/15/2019 1029   HDL 78 03/15/2019 1029   CHOLHDL 2.8 03/15/2019 1029   LDLCALC 126 (H)  03/15/2019 1029   HEMOGLOBIN A1C Lab Results  Component Value Date   HGBA1C 6.0 (H) 03/15/2019   TSH No results for input(s): TSH in the last 8760 hours.  PRN Meds:. There are no discontinued medications. Current Meds  Medication Sig  . amLODipine (NORVASC) 10 MG tablet Take 1 tablet (10 mg total) by mouth daily.  Marland Kitchen aspirin 81 MG chewable tablet Chew by mouth  daily.  . Biotin 10 MG TABS Take by mouth daily.  . cholecalciferol (VITAMIN D3) 25 MCG (1000 UT) tablet Take 1,000 Units by mouth daily.  Marland Kitchen losartan-hydrochlorothiazide (HYZAAR) 100-25 MG tablet Take 1 tablet by mouth daily.  . Multiple Vitamins-Minerals (MULTIVITAMIN WOMENS 50+ ADV PO) Take by mouth daily.  . prednisoLONE acetate (PRED FORTE) 1 % ophthalmic suspension 1 drop 2 (two) times daily.  . vitamin B-12 (CYANOCOBALAMIN) 1000 MCG tablet Take 1,000 mcg by mouth daily.   Current Facility-Administered Medications for the 04/17/19 encounter (Office Visit) with Miquel Dunn, NP  Medication  . 0.9 %  sodium chloride infusion    Cardiac Studies:   Echocardiogram 01/06/2019: Normal LV systolic function with EF 55%. Left ventricle cavity is normal in size. Moderate concentric hypertrophy of the left ventricle. Normal global wall motion. Doppler evidence of grade I (impaired) diastolic dysfunction, normal LAP.  No significant valvular abnormalities.  Inadequate TR jet to estimate pulmonary artery systolic pressure. Normal right atrial pressure.   Assessment:     ICD-10-CM   1. Essential hypertension  I10   2. Tobacco use  Z72.0     EKG 01/02/2019: Normal sinus rhythm at 85 bpm, left atrial enlargement, RBBB. Abnormal EKG.  Recommendations:   Patient with hypertension, ongoing tobacco use, with new onset RBBB and uncontrolled hypertension.   She continues to have elevated blood pressure despite increasing her dose of amlodipine.  She is on max dose losartan hydrochlorothiazide.  I will start Bystolic 5 mg  daily.  She was counseled on the importance of diet modifications to also help with controlling her blood pressure.  Unfortunately she has not had any weight loss since last seen by me, we discussed ways to help with this.  She also unfortunately continues to smoke, but does state that she has been able to cut back.  Encouraged her to continue to work on complete smoking cessation.  I will see her back in 8 weeks for follow-up.   Miquel Dunn, MSN, APRN, FNP-C [facility]. [place] Office: [phone] Fax: [phone]

## 2019-04-18 ENCOUNTER — Encounter: Payer: Self-pay | Admitting: Cardiology

## 2019-05-30 ENCOUNTER — Other Ambulatory Visit: Payer: Self-pay

## 2019-05-30 DIAGNOSIS — I10 Essential (primary) hypertension: Secondary | ICD-10-CM

## 2019-05-30 MED ORDER — AMLODIPINE BESYLATE 10 MG PO TABS: 10.0000 mg | ORAL_TABLET | Freq: Every day | ORAL | 2 refills | Status: DC

## 2019-06-06 ENCOUNTER — Ambulatory Visit (INDEPENDENT_AMBULATORY_CARE_PROVIDER_SITE_OTHER): Payer: BC Managed Care – PPO | Admitting: Cardiology

## 2019-06-06 ENCOUNTER — Encounter: Payer: Self-pay | Admitting: Cardiology

## 2019-06-06 ENCOUNTER — Other Ambulatory Visit: Payer: Self-pay

## 2019-06-06 VITALS — BP 154/72 | HR 89 | Temp 97.3°F | Resp 16 | Ht 63.0 in | Wt 159.0 lb

## 2019-06-06 DIAGNOSIS — I10 Essential (primary) hypertension: Secondary | ICD-10-CM

## 2019-06-06 DIAGNOSIS — Z720 Tobacco use: Secondary | ICD-10-CM | POA: Diagnosis not present

## 2019-06-06 MED ORDER — SPIRONOLACTONE 25 MG PO TABS: 25.0000 mg | ORAL_TABLET | Freq: Every day | ORAL | 2 refills | Status: DC

## 2019-06-06 NOTE — Progress Notes (Signed)
Primary Physician:  [name], FNP   Patient ID: [name], female    DOB: 07-12-1960, 58 y.o.   MRN: [id number]  Subjective:    Chief Complaint  Patient presents with  . Hypertension  . Follow-up    8 week    HPI: Evelyn [name]  is a 58 y.o. female  with hypertension, abdominal aortic calcification, recently evaluated by [place] for uncontrolled hypertension and abnormal EKG.   Recent EKG showed newly noted RBBB compared to EKG in April 2019. Blood pressure has improved with amlodipine and losartan HCT. She underwent echocardiogram on 01/06/19 that revealed normal LVEF, moderate LVH, and grade 1 diastolic dysfunction.   Due to continued elevated blood pressure, she was started on Bystolic at her last visit. She noted tingling/numbness in her fingers with the medication and has since stopped. Symptoms have resolved. Blood pressure has continued to be elevated. States that she feels well.   She does smoke 1/2 pack per day for the last 30 years, but has recently been able to cut back to about 5 cigarettes per day. She is hoping to quit soon.   She is fairly active, and previously walked 5 miles per day, but has not walked much this year. 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  Past Medical History:  Diagnosis Date  . Anxiety   . Arthritis   . Blood transfusion without reported diagnosis   . Calcification of abdominal aorta ([place]) 11/11/2017  . Hypertension     Past Surgical History:  Procedure Laterality Date  . CESAREAN SECTION     x2  . FOOT SURGERY Left    Hammmer Toe  . TUBAL LIGATION      Social History   Socioeconomic History  . Marital status: Married    Spouse name: Not on file  . Number of children: 2  . Years of education: Not on file  . Highest education level: Not on file  Occupational History  . Not on file  Tobacco Use  . Smoking status: Current Every Day Smoker    Packs/day: 0.25    Years: 22.00    Pack years:  5.50    Types: Cigarettes  . Smokeless tobacco: Never Used  Substance and Sexual Activity  . Alcohol use: Yes    Comment: beer daily  . Drug use: No  . Sexual activity: Not on file  Other Topics Concern  . Not on file  Social History Narrative  . Not on file   Social Determinants of Health   Financial Resource Strain:   . Difficulty of Paying Living Expenses: Not on file  Food Insecurity:   . Worried About Charity fundraiser in the Last Year: Not on file  . Ran Out of Food in the Last Year: Not on file  Transportation Needs:   . Lack of Transportation (Medical): Not on file  . Lack of Transportation (Non-Medical): Not on file  Physical Activity:   . Days of Exercise per Week: Not on file  . Minutes of Exercise per Session: Not on file  Stress:   . Feeling of Stress : Not on file  Social Connections:   . Frequency of Communication with Friends and Family: Not on file  . Frequency of Social Gatherings with Friends and Family: Not on file  . Attends Religious Services: Not on file  . Active Member of Clubs or Organizations: Not on file  . Attends Club or  Organization Meetings: Not on file  . Marital Status: Not on file  Intimate Partner Violence:   . Fear of Current or Ex-Partner: Not on file  . Emotionally Abused: Not on file  . Physically Abused: Not on file  . Sexually Abused: Not on file    Review of Systems  Constitution: Negative for decreased appetite, malaise/fatigue, weight gain and weight loss.  Eyes: Negative for visual disturbance.  Cardiovascular: Negative for chest pain, claudication, dyspnea on exertion, leg swelling, orthopnea, palpitations and syncope.  Respiratory: Negative for hemoptysis and wheezing.   Endocrine: Negative for cold intolerance and heat intolerance.  Hematologic/Lymphatic: Does not bruise/bleed easily.  Skin: Negative for nail changes.  Musculoskeletal: Negative for muscle weakness and myalgias.  Gastrointestinal: Negative for  abdominal pain, change in bowel habit, nausea and vomiting.  Neurological: Negative for difficulty with concentration, dizziness, focal weakness and headaches.  Psychiatric/Behavioral: Negative for altered mental status and suicidal ideas.  All other systems reviewed and are negative.     Objective:  Blood pressure (!) 154/72, pulse 89, temperature (!) 97.3 F (36.3 C), temperature source Oral, resp. rate 16, height 5\' 3"  (1.6 m), weight 159 lb (72.1 kg), last menstrual period 07/16/2017, SpO2 97 %. Body mass index is 28.17 kg/m.    Physical Exam  Constitutional: She is oriented to person, place, and time. Vital signs are normal. She appears well-developed and well-nourished.  HENT:  Head: Normocephalic and atraumatic.  Cardiovascular: Normal rate, regular rhythm, normal heart sounds and intact distal pulses.  Pulses:      Femoral pulses are 2+ on the right side and 2+ on the left side.      Popliteal pulses are 2+ on the right side and 2+ on the left side.  Pulmonary/Chest: Effort normal and breath sounds normal. No accessory muscle usage. No respiratory distress.  Abdominal: Soft. Bowel sounds are normal.  Musculoskeletal:        General: Normal range of motion.     Cervical back: Normal range of motion.  Neurological: She is alert and oriented to person, place, and time.  Skin: Skin is warm and dry.  Vitals reviewed.  Radiology: No results found.  Laboratory examination:    CMP Latest Ref Rng & Units 03/15/2019 12/12/2018 06/28/2018  Glucose 65 - 99 mg/dL 100(H) 99 79  BUN 6 - 24 mg/dL 13 12 11   Creatinine 0.57 - 1.00 mg/dL 0.68 0.68 0.69  Sodium 134 - 144 mmol/L 139 139 141  Potassium 3.5 - 5.2 mmol/L 4.1 3.8 3.4(L)  Chloride 96 - 106 mmol/L 98 103 100  CO2 20 - 29 mmol/L 25 21 24   Calcium 8.7 - 10.2 mg/dL 9.8 9.1 10.1  Total Protein 6.0 - 8.5 g/dL 7.3 - 7.4  Total Bilirubin 0.0 - 1.2 mg/dL 0.3 - 0.5  Alkaline Phos 39 - 117 IU/L 65 - 62  AST 0 - 40 IU/L 17 - 17  ALT 0 -  32 IU/L 21 - 19   CBC Latest Ref Rng & Units 03/15/2019 06/28/2018 02/02/2018  WBC 3.4 - 10.8 x10E3/uL 8.3 6.9 5.7  Hemoglobin 11.1 - 15.9 g/dL 14.6 14.8 14.8  Hematocrit 34.0 - 46.6 % 44.0 43.6 43.2  Platelets 150 - 450 x10E3/uL 263 236 251   Lipid Panel     Component Value Date/Time   CHOL 220 (H) 03/15/2019 1029   TRIG 89 03/15/2019 1029   HDL 78 03/15/2019 1029   CHOLHDL 2.8 03/15/2019 1029   LDLCALC 126 (H) 03/15/2019 1029  HEMOGLOBIN A1C Lab Results  Component Value Date   HGBA1C 6.0 (H) 03/15/2019   TSH No results for input(s): TSH in the last 8760 hours.  PRN Meds:. Medications Discontinued During This Encounter  Medication Reason  . nebivolol (BYSTOLIC) 5 MG tablet Error   Current Meds  Medication Sig  . amLODipine (NORVASC) 10 MG tablet Take 1 tablet (10 mg total) by mouth daily.  Marland Kitchen aspirin 81 MG chewable tablet Chew by mouth daily.  . Biotin 10 MG TABS Take by mouth daily.  . cholecalciferol (VITAMIN D3) 25 MCG (1000 UT) tablet Take 1,000 Units by mouth daily.  Marland Kitchen losartan-hydrochlorothiazide (HYZAAR) 100-25 MG tablet Take 1 tablet by mouth daily.  . Multiple Vitamins-Minerals (MULTIVITAMIN WOMENS 50+ ADV PO) Take by mouth daily.  . prednisoLONE acetate (PRED FORTE) 1 % ophthalmic suspension 1 drop 2 (two) times daily.  . vitamin B-12 (CYANOCOBALAMIN) 1000 MCG tablet Take 1,000 mcg by mouth daily.   Current Facility-Administered Medications for the 06/06/19 encounter (Office Visit) with Miquel Dunn, NP  Medication  . 0.9 %  sodium chloride infusion    Cardiac Studies:   Echocardiogram 01/06/2019: Normal LV systolic function with EF 55%. Left ventricle cavity is normal in size. Moderate concentric hypertrophy of the left ventricle. Normal global wall motion. Doppler evidence of grade I (impaired) diastolic dysfunction, normal LAP.  No significant valvular abnormalities.  Inadequate TR jet to estimate pulmonary artery systolic pressure. Normal right  atrial pressure.   Assessment:     ICD-10-CM   1. Essential hypertension  99991111 Basic metabolic panel  2. Tobacco use  Z72.0     EKG 01/02/2019: Normal sinus rhythm at 85 bpm, left atrial enlargement, RBBB. Abnormal EKG.  Recommendations:   Patient with hypertension, ongoing tobacco use, with new onset RBBB and uncontrolled hypertension.   She was unable to tolerate Bystolic. I will start her on Aldactone 25 mg daily. She reports having a history of hypokalemia, which will help with this. I will check BMP in 2 weeks for surveillance. Continue with Losartan HCTZ and amlodipine. Follow low sodium diet to also help with BP control.  I have congratulated her on being able to cut back further and urged her to continue with efforts toward complete smoking cessation. She is hopeful that she will be able to quit soon. I will see her back in 2 months for follow up on hypertension, but encouraged her to contact me sooner if needed.    Miquel Dunn, MSN, APRN, FNP-C [facility]. [place] Office: [phone] Fax: [phone]

## 2019-06-08 ENCOUNTER — Other Ambulatory Visit: Payer: Self-pay | Admitting: Nurse Practitioner

## 2019-06-08 DIAGNOSIS — I10 Essential (primary) hypertension: Secondary | ICD-10-CM

## 2019-07-17 ENCOUNTER — Encounter: Payer: Self-pay | Admitting: Nurse Practitioner

## 2019-07-17 ENCOUNTER — Other Ambulatory Visit: Payer: Self-pay

## 2019-07-17 ENCOUNTER — Ambulatory Visit (INDEPENDENT_AMBULATORY_CARE_PROVIDER_SITE_OTHER): Payer: BC Managed Care – PPO | Admitting: Nurse Practitioner

## 2019-07-17 VITALS — BP 122/84 | HR 90 | Temp 98.4°F | Ht 63.6 in | Wt 159.4 lb

## 2019-07-17 DIAGNOSIS — M25512 Pain in left shoulder: Secondary | ICD-10-CM

## 2019-07-17 DIAGNOSIS — R200 Anesthesia of skin: Secondary | ICD-10-CM

## 2019-07-17 DIAGNOSIS — R202 Paresthesia of skin: Secondary | ICD-10-CM | POA: Diagnosis not present

## 2019-07-17 DIAGNOSIS — G8929 Other chronic pain: Secondary | ICD-10-CM

## 2019-07-17 DIAGNOSIS — M25511 Pain in right shoulder: Secondary | ICD-10-CM | POA: Diagnosis not present

## 2019-07-17 DIAGNOSIS — E7800 Pure hypercholesterolemia, unspecified: Secondary | ICD-10-CM

## 2019-07-17 DIAGNOSIS — I700 Atherosclerosis of aorta: Secondary | ICD-10-CM | POA: Diagnosis not present

## 2019-07-17 DIAGNOSIS — I10 Essential (primary) hypertension: Secondary | ICD-10-CM

## 2019-07-17 DIAGNOSIS — R7309 Other abnormal glucose: Secondary | ICD-10-CM

## 2019-07-17 MED ORDER — GABAPENTIN 100 MG PO CAPS: 100.0000 mg | ORAL_CAPSULE | Freq: Every day | ORAL | 2 refills | Status: DC

## 2019-07-17 MED ORDER — DICLOFENAC SODIUM 1 % EX GEL: 2.0000 g | Freq: Four times a day (QID) | CUTANEOUS | 2 refills | Status: DC

## 2019-07-17 NOTE — Progress Notes (Signed)
This visit occurred during the SARS-CoV-2 public health emergency.  Safety protocols were in place, including screening questions prior to the visit, additional usage of staff PPE, and extensive cleaning of exam room while observing appropriate contact time as indicated for disinfecting solutions.  Subjective:     Patient ID: Evelyn [name] , female    DOB: 01-22-1961 , 59 y.o.   MRN: [id number]   Chief Complaint  Patient presents with  . Hypertension    patient presents today for a 4 month b/p check    HPI  She has been to Cardiology in December.    Bilateral lower extremities with numbness lateral in the middle of the night.  This has been ongoing for more than 3 months.   She is around more people who have asthma so she is not smoking as much.     Hypertension This is a chronic problem. The current episode started more than 1 year ago. The problem has been gradually worsening since onset. The problem is uncontrolled (she has been without her blood pressure medications for one week). Pertinent negatives include no anxiety, chest pain, headaches, palpitations or shortness of breath.  Shoulder Pain  The pain is present in the right shoulder and left shoulder. This is a chronic problem. The current episode started 1 to 4 weeks ago. The problem occurs intermittently. The quality of the pain is described as sharp. Associated symptoms include numbness (upper arms). Pertinent negatives include no limited range of motion or tingling. Associated symptoms comments: Occurs mostly at work.  . The symptoms are aggravated by standing and activity (mostly at work). She has tried acetaminophen (she was taking 3 Tylenol 325mg ) for the symptoms. The treatment provided significant relief. There is no history of diabetes.     Past Medical History:  Diagnosis Date  . Anxiety   . Arthritis   . Blood transfusion without reported diagnosis   . Calcification of abdominal aorta ([place]) 11/11/2017  .  Hypertension      Family History  Problem Relation Age of Onset  . Diabetes Mother   . COPD Mother   . Renal Disease Mother   . Heart attack Father 85  . Stroke Brother   . Colon cancer Neg Hx   . Esophageal cancer Neg Hx   . Liver cancer Neg Hx   . Pancreatic cancer Neg Hx   . Rectal cancer Neg Hx   . Stomach cancer Neg Hx      Current Outpatient Medications:  .  amLODipine (NORVASC) 10 MG tablet, Take 1 tablet (10 mg total) by mouth daily., Disp: 30 tablet, Rfl: 2 .  aspirin 81 MG chewable tablet, Chew by mouth daily., Disp: , Rfl:  .  Biotin 10 MG TABS, Take by mouth daily., Disp: , Rfl:  .  cholecalciferol (VITAMIN D3) 25 MCG (1000 UT) tablet, Take 1,000 Units by mouth daily., Disp: , Rfl:  .  losartan-hydrochlorothiazide (HYZAAR) 100-25 MG tablet, TAKE 1 TABLET BY MOUTH EVERY DAY, Disp: 90 tablet, Rfl: 1 .  Multiple Vitamins-Minerals (MULTIVITAMIN WOMENS 50+ ADV PO), Take by mouth daily., Disp: , Rfl:  .  prednisoLONE acetate (PRED FORTE) 1 % ophthalmic suspension, 1 drop 2 (two) times daily., Disp: , Rfl:  .  spironolactone (ALDACTONE) 25 MG tablet, Take 1 tablet (25 mg total) by mouth daily., Disp: 30 tablet, Rfl: 2 .  vitamin B-12 (CYANOCOBALAMIN) 1000 MCG tablet, Take 1,000 mcg by mouth daily., Disp: , Rfl:   Current Facility-Administered Medications:  .  0.9 %  sodium chloride infusion, 500 mL, Intravenous, Once, Pyrtle, Lajuan Lines, MD   Allergies  Allergen Reactions  . Lisinopril     Intolerance cough      Review of Systems  Constitutional: Negative.  Negative for fatigue.  Respiratory: Negative.  Negative for cough, shortness of breath and wheezing.   Cardiovascular: Negative.  Negative for chest pain and palpitations.  Gastrointestinal: Negative.   Genitourinary: Negative.   Musculoskeletal:       Bilateral shoulder pain.    Allergic/Immunologic: Negative.   Neurological: Positive for numbness (upper arms). Negative for dizziness, tingling and headaches.   Psychiatric/Behavioral: Negative.        Insomnia.  Will take a tylenol pm due to being up every 2-3 hours. She use to work at night for 6 years about 10 years ago.      Today's Vitals   07/17/19 0850  BP: 122/84  Pulse: 90  Temp: 98.4 F (36.9 C)  TempSrc: Oral  Weight: 159 lb 6.4 oz (72.3 kg)  Height: 5' 3.6" (1.615 m)  PainSc: 0-No pain   Body mass index is 27.71 kg/m.   Objective:  Physical Exam Constitutional:      General: She is not in acute distress.    Appearance: Normal appearance.  Cardiovascular:     Rate and Rhythm: Normal rate and regular rhythm.     Pulses: Normal pulses.     Heart sounds: No murmur.  Pulmonary:     Effort: Pulmonary effort is normal. No respiratory distress.     Breath sounds: Normal breath sounds.  Musculoskeletal:        General: Tenderness (tenderness to bilateral shoulder muscles like tension) present. Normal range of motion.  Skin:    Capillary Refill: Capillary refill takes less than 2 seconds.  Neurological:     General: No focal deficit present.     Mental Status: She is alert and oriented to person, place, and time.     Cranial Nerves: No cranial nerve deficit.         Assessment And Plan:     1. Essential hypertension Chronic, good control Continue with current medications  2. Calcification of abdominal aorta ([facility])  This dates back to 2013 CT scan of abdomen revealing scattered calcifications along abdominal aorta  Will check lipid panel pending labs will start cholesterol medication  3. Numbness and tingling in both hands  This is ongoing, will check metabolic panel  Negative phalen's and tinel's   Will start gabapentin   4. Chronic pain of both shoulders  Likely muscle inflammation, will try diclofenac gel - diclofenac Sodium (VOLTAREN) 1 % GEL; Apply 2 g topically 4 (four) times daily.  Dispense: 100 g; Refill: 2  5. Elevated cholesterol  Will check lipid panel  Advised to avoid fried and fatty  foods  6. Abnormal glucose  Chronic, no current medications  Will check HgbA1c  7. Bilateral leg numbness  Restless leg vs metabolic cause  Will check metabolic studies   Will also try gabapentin to see if helps with discomfort which may help with sleep - gabapentin (NEURONTIN) 100 MG capsule; Take 1 capsule (100 mg total) by mouth at bedtime.  Dispense: 90 capsule; Refill: 2    [name], FNP    THE PATIENT IS ENCOURAGED TO PRACTICE SOCIAL DISTANCING DUE TO THE COVID-19 PANDEMIC.

## 2019-07-18 LAB — CMP14+EGFR
ALT: 19 IU/L (ref 0–32)
AST: 15 IU/L (ref 0–40)
Albumin/Globulin Ratio: 1.7 (ref 1.2–2.2)
Albumin: 4.5 g/dL (ref 3.8–4.9)
Alkaline Phosphatase: 61 IU/L (ref 39–117)
BUN/Creatinine Ratio: 16 (ref 9–23)
BUN: 12 mg/dL (ref 6–24)
Bilirubin Total: 0.3 mg/dL (ref 0.0–1.2)
CO2: 20 mmol/L (ref 20–29)
Calcium: 9.4 mg/dL (ref 8.7–10.2)
Chloride: 101 mmol/L (ref 96–106)
Creatinine, Ser: 0.73 mg/dL (ref 0.57–1.00)
GFR calc Af Amer: 106 mL/min/{1.73_m2} (ref >59)
GFR calc non Af Amer: 92 mL/min/{1.73_m2} (ref >59)
Globulin, Total: 2.6 g/dL (ref 1.5–4.5)
Glucose: 108 mg/dL — ABNORMAL HIGH (ref 65–99)
Potassium: 4.2 mmol/L (ref 3.5–5.2)
Sodium: 137 mmol/L (ref 134–144)
Total Protein: 7.1 g/dL (ref 6.0–8.5)

## 2019-07-18 LAB — LIPID PANEL
Chol/HDL Ratio: 3 ratio (ref 0.0–4.4)
Cholesterol, Total: 219 mg/dL — ABNORMAL HIGH (ref 100–199)
HDL: 74 mg/dL (ref >39)
LDL Chol Calc (NIH): 131 mg/dL — ABNORMAL HIGH (ref 0–99)
Triglycerides: 81 mg/dL (ref 0–149)
VLDL Cholesterol Cal: 14 mg/dL (ref 5–40)

## 2019-07-18 LAB — VITAMIN B12: Vitamin B-12: 1701 pg/mL — ABNORMAL HIGH (ref 232–1245)

## 2019-07-18 LAB — HEMOGLOBIN A1C
Est. average glucose Bld gHb Est-mCnc: 126 mg/dL
Hgb A1c MFr Bld: 6 % — ABNORMAL HIGH (ref 4.8–5.6)

## 2019-07-18 LAB — TSH: TSH: 1.06 u[IU]/mL (ref 0.450–4.500)

## 2019-07-20 ENCOUNTER — Telehealth: Payer: Self-pay

## 2019-07-20 ENCOUNTER — Other Ambulatory Visit: Payer: Self-pay | Admitting: Cardiology

## 2019-07-20 NOTE — Telephone Encounter (Signed)
Spoke w/pt gave her YR message  call luceal Mensinger she can get diclofenac gel OTC because her ins wont cover it Pt stated ok

## 2019-08-08 ENCOUNTER — Encounter: Payer: Self-pay | Admitting: Cardiology

## 2019-08-08 ENCOUNTER — Other Ambulatory Visit: Payer: Self-pay

## 2019-08-08 ENCOUNTER — Ambulatory Visit: Payer: BC Managed Care – PPO | Admitting: Cardiology

## 2019-08-08 VITALS — BP 122/71 | HR 96 | Temp 97.3°F | Ht 60.0 in | Wt 161.0 lb

## 2019-08-08 DIAGNOSIS — I10 Essential (primary) hypertension: Secondary | ICD-10-CM

## 2019-08-08 DIAGNOSIS — Z720 Tobacco use: Secondary | ICD-10-CM

## 2019-08-08 DIAGNOSIS — I700 Atherosclerosis of aorta: Secondary | ICD-10-CM | POA: Diagnosis not present

## 2019-08-08 DIAGNOSIS — R200 Anesthesia of skin: Secondary | ICD-10-CM

## 2019-08-08 DIAGNOSIS — E782 Mixed hyperlipidemia: Secondary | ICD-10-CM

## 2019-08-08 MED ORDER — SPIRONOLACTONE 25 MG PO TABS: 25.0000 mg | ORAL_TABLET | Freq: Every day | ORAL | 3 refills | Status: DC

## 2019-08-08 MED ORDER — ROSUVASTATIN CALCIUM 10 MG PO TABS: 10.0000 mg | ORAL_TABLET | Freq: Every day | ORAL | 2 refills | Status: DC

## 2019-08-08 NOTE — Progress Notes (Signed)
Primary Physician:  [name], FNP   Patient ID: Evelyn [name], female    DOB: 03-12-1961, 59 y.o.   MRN: [id number]  Subjective:    No chief complaint on file.   HPI: Evelyn [name]  is a 58 y.o. female  with hypertension, abdominal aortic calcification, RBBB, tobacco use disorder, and hypertension.    Recent EKG showed newly noted RBBB compared to EKG in April 2019. Blood pressure has improved with amlodipine and losartan HCT. She underwent echocardiogram on 01/06/19 that revealed normal LVEF, moderate LVH, and grade 1 diastolic dysfunction.   She was started on Aldactone at her last office visit, and since being on this, her blood pressure has significantly improved.  She is tolerating well.  She has not tolerated Bystolic in the past.  She is trying to make dietary changes to help to control her blood pressure and also promote weight loss.  She does have some tingling/numbness in her legs at rest, particularly in her left leg.  No symptoms of claudication. Has been started on gabapentin by her PCP.  She does smoke 1/2 pack per day for the last 30 years, but has recently been able to cut back to only a few cigarettes a day.   She is fairly active, and previously walked 5 miles per day, but has not walked much this year. 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  Past Medical History:  Diagnosis Date  . Anxiety   . Arthritis   . Blood transfusion without reported diagnosis   . Calcification of abdominal aorta ([place]) 11/11/2017  . Hypertension     Past Surgical History:  Procedure Laterality Date  . CESAREAN SECTION     x2  . FOOT SURGERY Left    [place]amm[place] Toe  . TUBAL LIGATION      Social History   Socioeconomic History  . Marital status: Married    Spouse name: Not on file  . Number of children: 2  . Years of education: Not on file  . Highest education [name]: Not on file  Occupational History  . Not on file  Tobacco Use  . Smoking  status: Current Every Day Smoker    Packs/day: 0.25    Years: 22.00    Pack years: 5.50    Types: Cigarettes  . Smokeless tobacco: Never Used  . Tobacco comment: she is down to 2-3 cigarettes per day  Substance and Sexual Activity  . Alcohol use: Not Currently  . Drug use: No  . Sexual activity: Not on file  Other Topics Concern  . Not on file  Social History Narrative  . Not on file   Social Determinants of Health   Financial Resource Strain:   . Difficulty of Paying Living Expenses: Not on file  Food Insecurity:   . Worried About Charity fundraiser in the Last Year: Not on file  . Ran Out of Food in the Last Year: Not on file  Transportation Needs:   . Lack of Transportation (Medical): Not on file  . Lack of Transportation (Non-Medical): Not on file  Physical Activity:   . Days of Exercise per Week: Not on file  . Minutes of Exercise per Session: Not on file  Stress:   . Feeling of Stress : Not on file  Social Connections:   . Frequency of Communication with Friends and Family: Not on file  . Frequency of Social Gatherings with Friends and Family: Not  on file  . Attends Religious Services: Not on file  . Active Member of Clubs or Organizations: Not on file  . Attends Archivist Meetings: Not on file  . Marital Status: Not on file  Intimate Partner Violence:   . Fear of Current or Ex-Partner: Not on file  . Emotionally Abused: Not on file  . Physically Abused: Not on file  . Sexually Abused: Not on file    Review of Systems  Constitution: Negative for decreased appetite, malaise/fatigue, weight gain and weight loss.  Eyes: Negative for visual disturbance.  Cardiovascular: Negative for chest pain, claudication, dyspnea on exertion, leg swelling, orthopnea, palpitations and syncope.  Respiratory: Negative for hemoptysis and wheezing.   Endocrine: Negative for cold intolerance and heat intolerance.  Hematologic/Lymphatic: Does not bruise/bleed easily.    Skin: Negative for nail changes.  Musculoskeletal: Negative for muscle weakness and myalgias.  Gastrointestinal: Negative for abdominal pain, change in bowel habit, nausea and vomiting.  Neurological: Negative for difficulty with concentration, dizziness, focal weakness and headaches.  Psychiatric/Behavioral: Negative for altered mental status and suicidal ideas.  All other systems reviewed and are negative.     Objective:  Blood pressure 122/71, pulse 96, temperature (!) 97.3 F (36.3 C), height 5' (1.524 m), weight 161 lb (73 kg), last menstrual period 07/16/2017, SpO2 98 %. Body mass index is 31.44 kg/m.    Physical Exam  Constitutional: She is oriented to person, place, and time. Vital signs are normal. She appears well-developed and well-nourished.  HENT:  Head: Normocephalic and atraumatic.  Cardiovascular: Normal rate, regular rhythm, normal heart sounds and intact distal pulses.  Pulses:      Femoral pulses are 2+ on the right side and 2+ on the left side.      Popliteal pulses are 2+ on the right side and 2+ on the left side.       Dorsalis pedis pulses are 1+ on the right side and 1+ on the left side.       Posterior tibial pulses are 1+ on the right side and 1+ on the left side.  Pulmonary/Chest: Effort normal and breath sounds normal. No accessory muscle usage. No respiratory distress.  Abdominal: Soft. Bowel sounds are normal.  Musculoskeletal:        General: Normal range of motion.     Cervical back: Normal range of motion.  Neurological: She is alert and oriented to person, place, and time.  Skin: Skin is warm and dry.  Vitals reviewed.  Radiology: No results found.  Laboratory examination:    CMP Latest Ref Rng & Units 07/17/2019 03/15/2019 12/12/2018  Glucose 65 - 99 mg/dL 108(H) 100(H) 99  BUN 6 - 24 mg/dL 12 13 12   Creatinine 0.57 - 1.00 mg/dL 0.73 0.68 0.68  Sodium 134 - 144 mmol/L 137 139 139  Potassium 3.5 - 5.2 mmol/L 4.2 4.1 3.8  Chloride 96 - 106  mmol/L 101 98 103  CO2 20 - 29 mmol/L 20 25 21   Calcium 8.7 - 10.2 mg/dL 9.4 9.8 9.1  Total Protein 6.0 - 8.5 g/dL 7.1 7.3 -  Total Bilirubin 0.0 - 1.2 mg/dL 0.3 0.3 -  Alkaline Phos 39 - 117 IU/L 61 65 -  AST 0 - 40 IU/L 15 17 -  ALT 0 - 32 IU/L 19 21 -   CBC Latest Ref Rng & Units 03/15/2019 06/28/2018 02/02/2018  WBC 3.4 - 10.8 x10E3/uL 8.3 6.9 5.7  Hemoglobin 11.1 - 15.9 g/dL 14.6 14.8 14.8  Hematocrit 34.0 - 46.6 % 44.0 43.6 43.2  Platelets 150 - 450 x10E3/uL 263 236 251   Lipid Panel     Component Value Date/Time   CHOL 219 (H) 07/17/2019 1731   TRIG 81 07/17/2019 1731   HDL 74 07/17/2019 1731   CHOLHDL 3.0 07/17/2019 1731   LDLCALC 131 (H) 07/17/2019 1731   HEMOGLOBIN A1C Lab Results  Component Value Date   HGBA1C 6.0 (H) 07/17/2019   TSH Recent Labs    07/17/19 1731  TSH 1.060    PRN Meds:. Medications Discontinued During This Encounter  Medication Reason  . spironolactone (ALDACTONE) 25 MG tablet Reorder   Current Meds  Medication Sig  . amLODipine (NORVASC) 10 MG tablet Take 1 tablet (10 mg total) by mouth daily.  Marland Kitchen aspirin 81 MG chewable tablet Chew by mouth daily.  . Biotin 10 MG TABS Take by mouth daily.  . cholecalciferol (VITAMIN D3) 25 MCG (1000 UT) tablet Take 1,000 Units by mouth daily.  . diclofenac Sodium (VOLTAREN) 1 % GEL Apply 2 g topically 4 (four) times daily.  Marland Kitchen gabapentin (NEURONTIN) 100 MG capsule Take 1 capsule (100 mg total) by mouth at bedtime.  Marland Kitchen losartan-hydrochlorothiazide (HYZAAR) 100-25 MG tablet TAKE 1 TABLET BY MOUTH EVERY DAY  . Multiple Vitamins-Minerals (MULTIVITAMIN WOMENS 50+ ADV PO) Take by mouth daily.  . prednisoLONE acetate (PRED FORTE) 1 % ophthalmic suspension 1 drop 2 (two) times daily.  Marland Kitchen spironolactone (ALDACTONE) 25 MG tablet Take 1 tablet (25 mg total) by mouth daily.  . vitamin B-12 (CYANOCOBALAMIN) 1000 MCG tablet Take 1,000 mcg by mouth daily.  . [DISCONTINUED] spironolactone (ALDACTONE) 25 MG tablet TAKE 1  TABLET BY MOUTH EVERY DAY   Current Facility-Administered Medications for the 08/08/19 encounter (Office Visit) with Miquel Dunn, NP  Medication  . 0.9 %  sodium chloride infusion    Cardiac Studies:   Echocardiogram 01/06/2019: Normal LV systolic function with EF 55%. Left ventricle cavity is normal in size. Moderate concentric hypertrophy of the left ventricle. Normal global wall motion. Doppler evidence of grade I (impaired) diastolic dysfunction, normal LAP.  No significant valvular abnormalities.  Inadequate TR jet to estimate pulmonary artery systolic pressure. Normal right atrial pressure.   Assessment:     ICD-10-CM   1. Essential hypertension  I10   2. Tobacco use  Z72.0   3. Mixed hyperlipidemia  E78.2 Lipid Profile    Comprehensive Metabolic Panel (CMET)  4. Aortic atherosclerosis (HCC)  I70.0   5. Numbness in left leg  R20.0 PCV LOWER ARTERIAL (BILATERAL)    EKG 01/02/2019: Normal sinus rhythm at 85 bpm, left atrial enlargement, RBBB. Abnormal EKG.  Recommendations:   Patient is here for follow-up for hypertension.  She is doing well and tolerating medications well.  Blood pressure has significantly improved, will continue with Aldactone, amlodipine, and losartan. Kidney function has been stable. Encouraged her to continue with low sodium diet and efforts toward weight loss. She is without symptoms of angina or clinical evidence of heart failure.  I have reviewed her recent lipid panel which shows elevated lipids.  She also has aortic atherosclerosis on previous CT scan.  Do feel that she would benefit from statin therapy.  I will start her on Crestor 10 mg daily.  Will check lipid panel and CMP for her next office visit for follow-up.  In regard to her intermittent tingling/numbness in her left leg, her symptoms are suggestive of neuropathic pain potentially related to spinal issues. Symptoms do  not occur with exertion.  She does have risk factors for PAD,  particularly with her smoking, will schedule for lower extremity arterial duplex for evaluation.   She has been able to significantly cut back on tobacco use, have congratulated her on this and provided positive reinforcement.  Encouraged her to continue to work on complete smoking cessation.  She hopes to be completely quit by her next office visit.  I will plan to see her back in 3 months for follow-up after her labs, but encouraged her to contact me sooner if needed.   Miquel Dunn, MSN, APRN, FNP-C [facility]. [place] Office: [phone] Fax: [phone]

## 2019-08-10 ENCOUNTER — Encounter: Payer: Self-pay | Admitting: Cardiology

## 2019-08-18 ENCOUNTER — Other Ambulatory Visit: Payer: Self-pay | Admitting: Cardiology

## 2019-08-18 ENCOUNTER — Other Ambulatory Visit: Payer: BC Managed Care – PPO

## 2019-08-20 ENCOUNTER — Other Ambulatory Visit: Payer: Self-pay | Admitting: Cardiology

## 2019-08-20 DIAGNOSIS — I10 Essential (primary) hypertension: Secondary | ICD-10-CM

## 2019-08-22 ENCOUNTER — Ambulatory Visit: Payer: BC Managed Care – PPO | Admitting: Nurse Practitioner

## 2019-08-24 ENCOUNTER — Other Ambulatory Visit: Payer: BC Managed Care – PPO

## 2019-08-25 ENCOUNTER — Ambulatory Visit: Payer: Self-pay | Attending: Internal Medicine

## 2019-08-25 DIAGNOSIS — Z23 Encounter for immunization: Secondary | ICD-10-CM | POA: Insufficient documentation

## 2019-08-25 NOTE — Progress Notes (Signed)
   Covid-19 Vaccination Clinic  Name:  Evelyn [name]    MRN: [id number] DOB: [birth year]  08/25/2019  [name] was observed post Covid-19 immunization for 15 minutes without incident. She was provided with Vaccine Information Sheet and instruction to access the V-Safe system.   [name] was instructed to call 911 with any severe reactions post vaccine: Marland Kitchen Difficulty breathing  . Swelling of face and throat  . A fast heartbeat  . A bad rash all over body  . Dizziness and weakness   Immunizations Administered    Name Date Dose VIS Date Route   Pfizer COVID-19 Vaccine 08/25/2019  6:30 PM 0.3 mL 06/02/2019 Intramuscular   Manufacturer: [place]   Lot: [id number]   [place]: [id number]

## 2019-09-26 ENCOUNTER — Ambulatory Visit: Payer: Self-pay | Attending: Internal Medicine

## 2019-09-26 DIAGNOSIS — Z23 Encounter for immunization: Secondary | ICD-10-CM

## 2019-09-26 NOTE — Progress Notes (Signed)
   Covid-19 Vaccination Clinic  Name:  Evelyn [name]    MRN: [id number] DOB: 11-15-1960  09/26/2019  [name] was observed post Covid-19 immunization for 15 minutes without incident. She was provided with Vaccine Information Sheet and instruction to access the V-Safe system.   [name] was instructed to call 911 with any severe reactions post vaccine: Marland Kitchen Difficulty breathing  . Swelling of face and throat  . A fast heartbeat  . A bad rash all over body  . Dizziness and weakness   Immunizations Administered    Name Date Dose VIS Date Route   Pfizer COVID-19 Vaccine 09/26/2019  1:55 PM 0.3 mL 06/02/2019 Intramuscular   Manufacturer: [place]   Lot: [id number]   [place]: [id number]

## 2019-11-22 ENCOUNTER — Other Ambulatory Visit: Payer: Self-pay | Admitting: Internal Medicine

## 2019-11-22 DIAGNOSIS — I10 Essential (primary) hypertension: Secondary | ICD-10-CM

## 2019-11-22 NOTE — Telephone Encounter (Signed)
Pt must come to next appt for further refills

## 2019-12-13 ENCOUNTER — Ambulatory Visit (INDEPENDENT_AMBULATORY_CARE_PROVIDER_SITE_OTHER): Payer: BC Managed Care – PPO | Admitting: Nurse Practitioner

## 2019-12-13 ENCOUNTER — Encounter: Payer: Self-pay | Admitting: Nurse Practitioner

## 2019-12-13 ENCOUNTER — Other Ambulatory Visit: Payer: Self-pay

## 2019-12-13 VITALS — BP 140/80 | HR 84 | Temp 98.2°F | Ht 63.6 in | Wt 159.4 lb

## 2019-12-13 DIAGNOSIS — R7309 Other abnormal glucose: Secondary | ICD-10-CM

## 2019-12-13 DIAGNOSIS — E7800 Pure hypercholesterolemia, unspecified: Secondary | ICD-10-CM | POA: Diagnosis not present

## 2019-12-13 DIAGNOSIS — I10 Essential (primary) hypertension: Secondary | ICD-10-CM | POA: Diagnosis not present

## 2019-12-13 DIAGNOSIS — Z7982 Long term (current) use of aspirin: Secondary | ICD-10-CM

## 2019-12-13 MED ORDER — LOSARTAN POTASSIUM-HCTZ 100-25 MG PO TABS: 1.0000 | ORAL_TABLET | Freq: Every day | ORAL | 1 refills | Status: DC

## 2019-12-13 NOTE — Progress Notes (Signed)
This visit occurred during the SARS-CoV-2 public health emergency.  Safety protocols were in place, including screening questions prior to the visit, additional usage of staff PPE, and extensive cleaning of exam room while observing appropriate contact time as indicated for disinfecting solutions.  Subjective:     Patient ID: Evelyn [name] , female    DOB: July 18, 1960 , 59 y.o.   MRN: [id number]   Chief Complaint  Patient presents with  . Hypertension    HPI  Hyperlipidemia - she is on crestor.   She reports she had fatigue for about one month after having her second covid.   She is working at [employer] - she has been stressed at work.  Wt Readings from Last 3 Encounters: 12/13/19 : 159 lb 6.4 oz (72.3 kg) 08/08/19 : 161 lb (73 kg) 07/17/19 : 159 lb 6.4 oz (72.3 kg)  Hypertension This is a chronic problem. The current episode started more than 1 year ago. The problem has been gradually worsening since onset. The problem is uncontrolled (she has been without her blood pressure medications for one week). Pertinent negatives include no anxiety, chest pain, headaches, palpitations or shortness of breath. There are no associated agents to hypertension. Risk factors for coronary artery disease include obesity and sedentary lifestyle.      Past Medical History:  Diagnosis Date  . Anxiety   . Arthritis   . Blood transfusion without reported diagnosis   . Calcification of abdominal aorta ([place]) 11/11/2017  . Hypertension      Family History  Problem Relation Age of Onset  . Diabetes Mother   . COPD Mother   . Renal Disease Mother   . Heart attack Father 36  . Stroke Brother   . Colon cancer Neg Hx   . Esophageal cancer Neg Hx   . Liver cancer Neg Hx   . Pancreatic cancer Neg Hx   . Rectal cancer Neg Hx   . Stomach cancer Neg Hx      Current Outpatient Medications:  .  amLODipine (NORVASC) 10 MG tablet, TAKE 1 TABLET BY MOUTH EVERY DAY, Disp: 90 tablet, Rfl: 1 .  aspirin 81 MG  chewable tablet, Chew by mouth daily., Disp: , Rfl:  .  Biotin 10 MG TABS, Take by mouth daily., Disp: , Rfl:  .  cholecalciferol (VITAMIN D3) 25 MCG (1000 UT) tablet, Take 1,000 Units by mouth daily., Disp: , Rfl:  .  diclofenac Sodium (VOLTAREN) 1 % GEL, Apply 2 g topically 4 (four) times daily., Disp: 100 g, Rfl: 2 .  losartan-hydrochlorothiazide (HYZAAR) 100-25 MG tablet, TAKE 1 TABLET BY MOUTH EVERY DAY, Disp: 30 tablet, Rfl: 0 .  Multiple Vitamins-Minerals (MULTIVITAMIN WOMENS 50+ ADV PO), Take by mouth daily., Disp: , Rfl:  .  prednisoLONE acetate (PRED FORTE) 1 % ophthalmic suspension, 1 drop 2 (two) times daily., Disp: , Rfl:  .  rosuvastatin (CRESTOR) 10 MG tablet, TAKE 1 TABLET BY MOUTH EVERY DAY, Disp: 90 tablet, Rfl: 1 .  spironolactone (ALDACTONE) 25 MG tablet, Take 1 tablet (25 mg total) by mouth daily., Disp: 90 tablet, Rfl: 3 .  vitamin B-12 (CYANOCOBALAMIN) 1000 MCG tablet, Take 1,000 mcg by mouth daily., Disp: , Rfl:  .  gabapentin (NEURONTIN) 100 MG capsule, Take 1 capsule (100 mg total) by mouth at bedtime. (Patient not taking: Reported on 12/13/2019), Disp: 90 capsule, Rfl: 2  Current Facility-Administered Medications:  .  0.9 %  sodium chloride infusion, 500 mL, Intravenous, Once, Pyrtle, Lajuan Lines, MD  Allergies  Allergen Reactions  . Lisinopril     Intolerance cough      Review of Systems  Constitutional: Negative.  Negative for fatigue.  Respiratory: Negative for cough and shortness of breath.   Cardiovascular: Negative for chest pain, palpitations and leg swelling.  Neurological: Negative for dizziness and headaches.  Psychiatric/Behavioral: Negative.      Today's Vitals   12/13/19 1602  BP: 140/80  Pulse: 84  Temp: 98.2 F (36.8 C)  TempSrc: Oral  Weight: 159 lb 6.4 oz (72.3 kg)  Height: 5' 3.6" (1.615 m)  PainSc: 0-No pain   Body mass index is 27.71 kg/m.   Objective:  Physical Exam Vitals reviewed.  Constitutional:      General: She is not in  acute distress.    Appearance: Normal appearance.  Cardiovascular:     Rate and Rhythm: Normal rate and regular rhythm.     Pulses: Normal pulses.     Heart sounds: No murmur heard.   Pulmonary:     Effort: Pulmonary effort is normal. No respiratory distress.     Breath sounds: Normal breath sounds.  Musculoskeletal:        General: No tenderness. Normal range of motion.  Skin:    Capillary Refill: Capillary refill takes less than 2 seconds.  Neurological:     General: No focal deficit present.     Mental Status: She is alert and oriented to person, place, and time.     Cranial Nerves: No cranial nerve deficit.  Psychiatric:        Mood and Affect: Mood normal.        Behavior: Behavior normal.        Thought Content: Thought content normal.        Judgment: Judgment normal.         Assessment And Plan:      1. Essential hypertension Blood pressure is fairly controlled She is to continue current medications and avoid high salt foods - CMP14+EGFR  2. Elevated cholesterol Chronic, stable and tolerating medications well. - Lipid panel - CMP14+EGFR  3. Abnormal glucose Chronic, stable. No current medications Pending labs may need to start metformin - CMP14+EGFR - Hemoglobin A1c    [name], FNP    THE PATIENT IS ENCOURAGED TO PRACTICE SOCIAL DISTANCING DUE TO THE COVID-19 PANDEMIC.

## 2019-12-21 ENCOUNTER — Other Ambulatory Visit: Payer: Self-pay

## 2019-12-21 ENCOUNTER — Ambulatory Visit: Payer: BC Managed Care – PPO | Admitting: Cardiology

## 2019-12-21 ENCOUNTER — Ambulatory Visit: Payer: BC Managed Care – PPO

## 2019-12-21 DIAGNOSIS — R200 Anesthesia of skin: Secondary | ICD-10-CM

## 2019-12-25 NOTE — Progress Notes (Signed)
Normal Lower Extremity Arterial Duplex  Normal circulation

## 2019-12-29 ENCOUNTER — Other Ambulatory Visit: Payer: Self-pay

## 2019-12-29 ENCOUNTER — Ambulatory Visit: Payer: BC Managed Care – PPO | Admitting: Cardiology

## 2019-12-29 ENCOUNTER — Encounter: Payer: Self-pay | Admitting: Cardiology

## 2019-12-29 VITALS — BP 115/66 | HR 85 | Ht 63.6 in | Wt 159.0 lb

## 2019-12-29 DIAGNOSIS — I700 Atherosclerosis of aorta: Secondary | ICD-10-CM

## 2019-12-29 DIAGNOSIS — I10 Essential (primary) hypertension: Secondary | ICD-10-CM

## 2019-12-29 DIAGNOSIS — I4510 Unspecified right bundle-branch block: Secondary | ICD-10-CM

## 2019-12-29 DIAGNOSIS — E782 Mixed hyperlipidemia: Secondary | ICD-10-CM

## 2019-12-29 DIAGNOSIS — R7303 Prediabetes: Secondary | ICD-10-CM

## 2019-12-29 DIAGNOSIS — Z720 Tobacco use: Secondary | ICD-10-CM

## 2019-12-29 NOTE — Progress Notes (Signed)
Evelyn [name] Date of Birth: [birth year] MRN: [id number] Primary Care Provider:Moore, Doreene Burke, [place] Former Cardiology Providers: Jeri Lager, APRN, FNP-C Primary Cardiologist: Rex Kras, DO, [facility] (established care 12/29/2019)  Date: 12/31/19 Last Visit: 08/08/2019  Chief Complaint  Patient presents with  . Hypertension  . Follow-up    4 month    HPI  Evelyn [name] is a 59 y.o.  female who presents to the office with a chief complaint of " follow-up on high blood pressure." Patient's past medical history and cardiovascular risk factors include: Hypertension, abdominal aortic calcification, right bundle branch block, tobacco use disorder, benign essential hypertension.  She was last seen in the office back in February 2021 by Jeri Lager, APRN, FNP-C and now will be transitioning her care to me as of today.   Since last office visit patient denies any hospitalizations or urgent care visits.  Patient states that functionally patient remains stable since last visit.  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  Prior to the COVID-19 pandemic she used to walk 5 miles per day but has not restarted this routine.  She is compliant with her antihypertensive medications.  Patient states that when she wakes up in the morning before taking her medications her blood pressures are usually around 145/80 mmHg and after taking the morning medications it is usually in the range of 245-809 mmHg and diastolic blood pressure usually in 80 mmHg.  Unfortunately, patient continues to smoke at least half a pack per day.  But has reduced since last office visit.  No family history of premature coronary artery disease.  Denies prior history of coronary artery disease, myocardial infarction, congestive heart failure, deep venous thrombosis, pulmonary embolism, stroke, transient ischemic attack.  FUNCTIONAL STATUS: No structured exercise program or daily routine.      ALLERGIES: Allergies  Allergen Reactions  . Lisinopril     Intolerance cough    MEDICATION LIST PRIOR TO VISIT: Current Outpatient Medications on File Prior to Visit  Medication Sig Dispense Refill  . amLODipine (NORVASC) 10 MG tablet TAKE 1 TABLET BY MOUTH EVERY DAY 90 tablet 1  . aspirin 81 MG chewable tablet Chew by mouth daily.    . Biotin 10 MG TABS Take by mouth daily.    . cholecalciferol (VITAMIN D3) 25 MCG (1000 UT) tablet Take 1,000 Units by mouth daily.    . diclofenac Sodium (VOLTAREN) 1 % GEL Apply 2 g topically 4 (four) times daily. 100 g 2  . losartan-hydrochlorothiazide (HYZAAR) 100-25 MG tablet Take 1 tablet by mouth daily. 90 tablet 1  . Multiple Vitamins-Minerals (MULTIVITAMIN WOMENS 50+ ADV PO) Take by mouth daily.    . prednisoLONE acetate (PRED FORTE) 1 % ophthalmic suspension 1 drop 2 (two) times daily.    . rosuvastatin (CRESTOR) 10 MG tablet TAKE 1 TABLET BY MOUTH EVERY DAY 90 tablet 1  . spironolactone (ALDACTONE) 25 MG tablet Take 1 tablet (25 mg total) by mouth daily. 90 tablet 3  . vitamin B-12 (CYANOCOBALAMIN) 1000 MCG tablet Take 1,000 mcg by mouth daily.     No current facility-administered medications on file prior to visit.    PAST MEDICAL HISTORY: Past Medical History:  Diagnosis Date  . Anxiety   . Arthritis   . Blood transfusion without reported diagnosis   . Calcification of abdominal aorta ([place]) 11/11/2017  . Hyperlipidemia   . Hypertension     PAST SURGICAL HISTORY: Past Surgical History:  Procedure Laterality Date  .  CESAREAN SECTION     x2  . FOOT SURGERY Left    [place] Toe  . TUBAL LIGATION      FAMILY HISTORY: The patient's family history includes COPD in her mother; Diabetes in her mother; Heart attack (age of onset: 56) in her father; Renal Disease in her mother; Stroke in her brother.   SOCIAL HISTORY:  The patient  reports that she has been smoking cigarettes. She has a 5.50 pack-year smoking history. She has never  used smokeless tobacco. She reports current alcohol use of about 2.0 standard drinks of alcohol per week. She reports that she does not use drugs.  Review of Systems  Constitutional: Negative for chills and fever.  HENT: Negative for hoarse voice and nosebleeds.   Eyes: Negative for discharge, double vision and pain.  Cardiovascular: Negative for chest pain, claudication, dyspnea on exertion, leg swelling, near-syncope, orthopnea, palpitations, paroxysmal nocturnal dyspnea and syncope.  Respiratory: Negative for hemoptysis and shortness of breath.   Musculoskeletal: Negative for muscle cramps and myalgias.  Gastrointestinal: Negative for abdominal pain, constipation, diarrhea, hematemesis, hematochezia, melena, nausea and vomiting.  Neurological: Negative for dizziness and light-headedness.    PHYSICAL EXAM: Vitals with BMI 12/29/2019 12/13/2019 08/08/2019  Height 5' 3.6" 5' 3.6" 5\' 0"   Weight 159 lbs 159 lbs 6 oz 161 lbs  BMI 27.65 51.02 58.52  Systolic 778 242 353  Diastolic 66 80 71  Pulse 85 84 96   CONSTITUTIONAL: Well-developed and well-nourished. No acute distress.  SKIN: Skin is warm and dry. No rash noted. No cyanosis. No pallor. No jaundice HEAD: Normocephalic and atraumatic.  EYES: No scleral icterus MOUTH/THROAT: Moist oral membranes.  NECK: No JVD present. No thyromegaly noted. No carotid bruits  LYMPHATIC: No visible cervical adenopathy.  CHEST Normal respiratory effort. No intercostal retractions  LUNGS: Clear to auscultation bilaterally.  No stridor. No wheezes. No rales.  CARDIOVASCULAR: Regular, positive S1-S2, no murmurs rubs or gallops appreciated. ABDOMINAL: Obese, soft, nontender, nondistended, positive bowel sounds all 4 quadrants. No apparent ascites.  EXTREMITIES: No peripheral edema  HEMATOLOGIC: No significant bruising NEUROLOGIC: Oriented to person, place, and time. Nonfocal. Normal muscle tone.  PSYCHIATRIC: Normal mood and affect. Normal behavior.  Cooperative  CARDIAC DATABASE: EKG: 12/29/2019: Normal sinus rhythm, 84 bpm, right bundle branch block, left axis deviation, left atrial enlargement, poor R wave progression, without underlying ischemia or injury pattern.  No significant change compared to prior EKG.  Echocardiogram: 01/06/2019: LVEF 55%, moderate LVH, grade 1 diastolic impairment, normal LAP, no significant valvular abnormality.  Stress Testing:  None  Heart Catheterization: None  Vascular imaging: Lower Extremity Arterial Duplex 12/21/2019:  No hemodynamically significant stenoses are identified in the bilateral lower extremity arterial system. There is diffuse heterogeneous plaque  noted in the bilateral lower extremities. This exam reveals normal perfusion of the bilateral lower extremity (ABI 1.00).   LABORATORY DATA: CBC Latest Ref Rng & Units 03/15/2019 06/28/2018 02/02/2018  WBC 3.4 - 10.8 x10E3/uL 8.3 6.9 5.7  Hemoglobin 11.1 - 15.9 g/dL 14.6 14.8 14.8  Hematocrit 34.0 - 46.6 % 44.0 43.6 43.2  Platelets 150 - 450 x10E3/uL 263 236 251    CMP Latest Ref Rng & Units 07/17/2019 03/15/2019 12/12/2018  Glucose 65 - 99 mg/dL 108(H) 100(H) 99  BUN 6 - 24 mg/dL 12 13 12   Creatinine 0.57 - 1.00 mg/dL 0.73 0.68 0.68  Sodium 134 - 144 mmol/L 137 139 139  Potassium 3.5 - 5.2 mmol/L 4.2 4.1 3.8  Chloride 96 - 106  mmol/L 101 98 103  CO2 20 - 29 mmol/L 20 25 21   Calcium 8.7 - 10.2 mg/dL 9.4 9.8 9.1  Total Protein 6.0 - 8.5 g/dL 7.1 7.3 -  Total Bilirubin 0.0 - 1.2 mg/dL 0.3 0.3 -  Alkaline Phos 39 - 117 IU/L 61 65 -  AST 0 - 40 IU/L 15 17 -  ALT 0 - 32 IU/L 19 21 -    Lipid Panel     Component Value Date/Time   CHOL 219 (H) 07/17/2019 1731   TRIG 81 07/17/2019 1731   HDL 74 07/17/2019 1731   CHOLHDL 3.0 07/17/2019 1731   LDLCALC 131 (H) 07/17/2019 1731   LABVLDL 14 07/17/2019 1731    Lab Results  Component Value Date   HGBA1C 6.0 (H) 07/17/2019   HGBA1C 6.0 (H) 03/15/2019   No components found for:  NTPROBNP Lab Results  Component Value Date   TSH 1.060 07/17/2019   TSH 0.986 02/02/2018    Cardiac Panel (last 3 results) No results for input(s): CKTOTAL, CKMB, TROPONINIHS, RELINDX in the last 72 hours.  IMPRESSION:    ICD-10-CM   1. Essential hypertension  I10 EKG 01-BPZW    BASIC METABOLIC PANEL WITH GFR    Magnesium  2. Mixed hyperlipidemia  E78.2 Lipid Panel With LDL/HDL Ratio    LDL cholesterol, direct    CT CARDIAC SCORING    PCV MYOCARDIAL PERFUSION WO LEXISCAN  3. Tobacco use  Z72.0 PCV MYOCARDIAL PERFUSION WO LEXISCAN  4. Aortic atherosclerosis (HCC)  I70.0 CT CARDIAC SCORING    PCV MYOCARDIAL PERFUSION WO LEXISCAN  5. RBBB  I45.10 PCV MYOCARDIAL PERFUSION WO LEXISCAN  6. Prediabetes  R73.03 PCV MYOCARDIAL PERFUSION WO LEXISCAN     RECOMMENDATIONS: Evelyn [name] is a 59 y.o. female whose past medical history and cardiovascular risk factors include: Hypertension, abdominal aortic calcification, right bundle branch block, tobacco use disorder, benign essential hypertension.  Benign essential hypertension: . Office blood pressure is at goal.  . Medication reconciled.  . Low salt diet recommended. A diet that is rich in fruits, vegetables, legumes, and low-fat dairy products and low in snacks, sweets, and meats (such as the Dietary Approaches to Stop Hypertension [DASH] diet).   Mixed hyperlipidemia:  Continue statin therapy.  We will check fasting lipid profile.  Given the estimated ASCVD risk or recommend coronary artery calcification for further risk stratification.  Prediabetes: Currently managed by primary team.  Active tobacco use: Educated the importance of complete smoking cessation.  Aortic atherosclerosis: Patient has multiple cardiovascular risk factors including hypertension, hyperlipidemia, prediabetes and given her estimated ASCVD risk score recommended ischemic evaluation.  Exercise nuclear stress test.  FINAL MEDICATION LIST END OF  ENCOUNTER: No orders of the defined types were placed in this encounter.    Current Outpatient Medications:  .  amLODipine (NORVASC) 10 MG tablet, TAKE 1 TABLET BY MOUTH EVERY DAY, Disp: 90 tablet, Rfl: 1 .  aspirin 81 MG chewable tablet, Chew by mouth daily., Disp: , Rfl:  .  Biotin 10 MG TABS, Take by mouth daily., Disp: , Rfl:  .  cholecalciferol (VITAMIN D3) 25 MCG (1000 UT) tablet, Take 1,000 Units by mouth daily., Disp: , Rfl:  .  diclofenac Sodium (VOLTAREN) 1 % GEL, Apply 2 g topically 4 (four) times daily., Disp: 100 g, Rfl: 2 .  losartan-hydrochlorothiazide (HYZAAR) 100-25 MG tablet, Take 1 tablet by mouth daily., Disp: 90 tablet, Rfl: 1 .  Multiple Vitamins-Minerals (MULTIVITAMIN WOMENS 50+ ADV PO), Take by  mouth daily., Disp: , Rfl:  .  prednisoLONE acetate (PRED FORTE) 1 % ophthalmic suspension, 1 drop 2 (two) times daily., Disp: , Rfl:  .  rosuvastatin (CRESTOR) 10 MG tablet, TAKE 1 TABLET BY MOUTH EVERY DAY, Disp: 90 tablet, Rfl: 1 .  spironolactone (ALDACTONE) 25 MG tablet, Take 1 tablet (25 mg total) by mouth daily., Disp: 90 tablet, Rfl: 3 .  vitamin B-12 (CYANOCOBALAMIN) 1000 MCG tablet, Take 1,000 mcg by mouth daily., Disp: , Rfl:   Orders Placed This Encounter  Procedures  . CT CARDIAC SCORING  . BASIC METABOLIC PANEL WITH GFR  . Magnesium  . Lipid Panel With LDL/HDL Ratio  . LDL cholesterol, direct  . PCV MYOCARDIAL PERFUSION WO LEXISCAN  . EKG 12-Lead   --Continue cardiac medications as reconciled in final medication list. --Return in about 6 weeks (around 02/09/2020) for Lipid follow-up., review test results.. Or sooner if needed. --Continue follow-up with your primary care physician regarding the management of your other chronic comorbid conditions.  Patient's questions and concerns were addressed to her satisfaction. She voices understanding of the instructions provided during this encounter.   This note was created using a voice recognition software as a  result there may be grammatical errors inadvertently enclosed that do not reflect the nature of this encounter. Every attempt is made to correct such errors.  Rex Kras, [place], [facility]  Pager: [phone] Office: [phone]

## 2019-12-29 NOTE — Patient Instructions (Signed)
Fasting lipid profile and labs.  Calcium score and stress test  Follow up .

## 2020-01-02 ENCOUNTER — Other Ambulatory Visit: Payer: Self-pay

## 2020-01-02 DIAGNOSIS — I10 Essential (primary) hypertension: Secondary | ICD-10-CM

## 2020-01-02 DIAGNOSIS — E782 Mixed hyperlipidemia: Secondary | ICD-10-CM

## 2020-01-05 LAB — LDL CHOLESTEROL, DIRECT: LDL Direct: 65 mg/dL (ref 0–99)

## 2020-01-05 LAB — LIPID PANEL WITH LDL/HDL RATIO
Cholesterol, Total: 173 mg/dL (ref 100–199)
HDL: 81 mg/dL (ref >39)
LDL Chol Calc (NIH): 76 mg/dL (ref 0–99)
LDL/HDL Ratio: 0.9 ratio (ref 0.0–3.2)
Triglycerides: 89 mg/dL (ref 0–149)
VLDL Cholesterol Cal: 16 mg/dL (ref 5–40)

## 2020-01-05 LAB — MAGNESIUM: Magnesium: 1.7 mg/dL (ref 1.6–2.3)

## 2020-01-09 NOTE — Progress Notes (Signed)
Called and spoke with patient regarding her lab results.

## 2020-01-15 ENCOUNTER — Ambulatory Visit: Payer: BC Managed Care – PPO

## 2020-01-15 ENCOUNTER — Other Ambulatory Visit: Payer: Self-pay

## 2020-01-15 DIAGNOSIS — Z720 Tobacco use: Secondary | ICD-10-CM

## 2020-01-15 DIAGNOSIS — E782 Mixed hyperlipidemia: Secondary | ICD-10-CM

## 2020-01-15 DIAGNOSIS — I700 Atherosclerosis of aorta: Secondary | ICD-10-CM

## 2020-01-15 DIAGNOSIS — R7303 Prediabetes: Secondary | ICD-10-CM

## 2020-01-15 DIAGNOSIS — I4510 Unspecified right bundle-branch block: Secondary | ICD-10-CM

## 2020-01-23 ENCOUNTER — Telehealth: Payer: Self-pay

## 2020-01-23 NOTE — Telephone Encounter (Signed)
-----   Message from [place], [place] sent at 01/21/2020  8:40 PM EDT ----- The nuclear stress test that was recently performed was reported as normal perfusion and low risk study.  The other details of the report will be discussed at the next office visit.  If your symptoms have increased in intensity, frequency, duration or new symptoms suggestive of typical chest pain as discussed during last office visit please still seek medical attention at the closest ER via EMS.

## 2020-01-23 NOTE — Telephone Encounter (Signed)
Called pt no answer, left a vm about pt stress results.

## 2020-02-10 ENCOUNTER — Other Ambulatory Visit: Payer: Self-pay | Admitting: Cardiology

## 2020-02-10 DIAGNOSIS — I10 Essential (primary) hypertension: Secondary | ICD-10-CM

## 2020-02-15 ENCOUNTER — Encounter: Payer: Self-pay | Admitting: Cardiology

## 2020-02-15 ENCOUNTER — Ambulatory Visit: Payer: BC Managed Care – PPO | Admitting: Cardiology

## 2020-02-15 ENCOUNTER — Other Ambulatory Visit: Payer: Self-pay

## 2020-02-15 VITALS — BP 120/75 | HR 90 | Resp 16 | Ht 63.0 in | Wt 159.8 lb

## 2020-02-15 DIAGNOSIS — Z712 Person consulting for explanation of examination or test findings: Secondary | ICD-10-CM

## 2020-02-15 DIAGNOSIS — I10 Essential (primary) hypertension: Secondary | ICD-10-CM

## 2020-02-15 DIAGNOSIS — I4510 Unspecified right bundle-branch block: Secondary | ICD-10-CM

## 2020-02-15 DIAGNOSIS — Z720 Tobacco use: Secondary | ICD-10-CM

## 2020-02-15 DIAGNOSIS — I700 Atherosclerosis of aorta: Secondary | ICD-10-CM

## 2020-02-15 DIAGNOSIS — R7303 Prediabetes: Secondary | ICD-10-CM

## 2020-02-15 DIAGNOSIS — E782 Mixed hyperlipidemia: Secondary | ICD-10-CM

## 2020-02-15 NOTE — Progress Notes (Signed)
Evelyn [name] Date of Birth: 1960/11/25 MRN: [id number] Primary Care Provider:Moore, Doreene Burke, [place] Former Cardiology Providers: Jeri Lager, APRN, FNP-C Primary Cardiologist: Rex Kras, DO, [facility] (established care 12/29/2019)  Date: 02/15/20 Last Office Visit: 12/29/2019  Chief Complaint  Patient presents with  . Hyperlipidemia  . Hypertension  . Follow-up    6 week    HPI  Evelyn [name] is a 59 y.o.  female who presents to the office with a chief complaint of " follow-up on high blood pressure." Patient's past medical history and cardiovascular risk factors include: Hypertension, abdominal aortic calcification, right bundle branch block, tobacco use disorder, benign essential hypertension.  She was last seen in the office back in February 2021 by Jeri Lager, APRN, FNP-C and now will be transitioning her care to me as of today.   Since last office visit patient denies any hospitalizations or urgent care visits.  Patient states that functionally patient remains stable since last visit.  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  Prior to the COVID-19 pandemic she used to walk 5 miles per day but has not restarted this routine.  Patient states that her blood pressure are better at home. Office BP are well controlled as well.   Unfortunately, patient continues to smoke at least half a pack per day.  But has reduced since last office visit.  No family history of premature coronary artery disease.  Denies prior history of coronary artery disease, myocardial infarction, congestive heart failure, deep venous thrombosis, pulmonary embolism, stroke, transient ischemic attack.  FUNCTIONAL STATUS: No structured exercise program or daily routine.     ALLERGIES: Allergies  Allergen Reactions  . Lisinopril     Intolerance cough    MEDICATION LIST PRIOR TO VISIT: Current Outpatient Medications on File Prior to Visit  Medication Sig Dispense Refill  .  amLODipine (NORVASC) 10 MG tablet TAKE 1 TABLET BY MOUTH EVERY DAY 90 tablet 1  . aspirin 81 MG chewable tablet Chew by mouth daily.    . cholecalciferol (VITAMIN D3) 25 MCG (1000 UT) tablet Take 1,000 Units by mouth daily.    . diclofenac Sodium (VOLTAREN) 1 % GEL Apply 2 g topically 4 (four) times daily. 100 g 2  . losartan-hydrochlorothiazide (HYZAAR) 100-25 MG tablet Take 1 tablet by mouth daily. 90 tablet 1  . Multiple Vitamins-Minerals (MULTIVITAMIN WOMENS 50+ ADV PO) Take by mouth daily.    . prednisoLONE acetate (PRED FORTE) 1 % ophthalmic suspension 1 drop 2 (two) times daily.    . rosuvastatin (CRESTOR) 10 MG tablet TAKE 1 TABLET BY MOUTH EVERY DAY 90 tablet 1  . spironolactone (ALDACTONE) 25 MG tablet Take 1 tablet (25 mg total) by mouth daily. 90 tablet 3  . vitamin B-12 (CYANOCOBALAMIN) 1000 MCG tablet Take 1,000 mcg by mouth daily.    Marland Kitchen gabapentin (NEURONTIN) 100 MG capsule Take 100 mg by mouth at bedtime.     No current facility-administered medications on file prior to visit.    PAST MEDICAL HISTORY: Past Medical History:  Diagnosis Date  . Anxiety   . Arthritis   . Blood transfusion without reported diagnosis   . Calcification of abdominal aorta ([place]) 11/11/2017  . Hyperlipidemia   . Hypertension     PAST SURGICAL HISTORY: Past Surgical History:  Procedure Laterality Date  . CESAREAN SECTION     x2  . FOOT SURGERY Left    Hammmer Toe  . TUBAL LIGATION  FAMILY HISTORY: The patient's family history includes COPD in her mother; Diabetes in her mother; Heart attack (age of onset: 51) in her father; Renal Disease in her mother; Stroke in her brother.   SOCIAL HISTORY:  The patient  reports that she has been smoking cigarettes. She has a 5.50 pack-year smoking history. She has never used smokeless tobacco. She reports current alcohol use of about 2.0 standard drinks of alcohol per week. She reports that she does not use drugs.  Review of Systems    Constitutional: Negative for chills and fever.  HENT: Negative for hoarse voice and nosebleeds.   Eyes: Negative for discharge, double vision and pain.  Cardiovascular: Negative for chest pain, claudication, dyspnea on exertion, leg swelling, near-syncope, orthopnea, palpitations, paroxysmal nocturnal dyspnea and syncope.  Respiratory: Negative for hemoptysis and shortness of breath.   Musculoskeletal: Negative for muscle cramps and myalgias.  Gastrointestinal: Negative for abdominal pain, constipation, diarrhea, hematemesis, hematochezia, melena, nausea and vomiting.  Neurological: Negative for dizziness and light-headedness.    PHYSICAL EXAM: Vitals with BMI 02/15/2020 12/29/2019 12/13/2019  Height 5\' 3"  5' 3.6" 5' 3.6"  Weight 159 lbs 13 oz 159 lbs 159 lbs 6 oz  BMI 28.31 85.27 78.24  Systolic 235 361 443  Diastolic 75 66 80  Pulse 90 85 84   CONSTITUTIONAL: Well-developed and well-nourished. No acute distress.  SKIN: Skin is warm and dry. No rash noted. No cyanosis. No pallor. No jaundice HEAD: Normocephalic and atraumatic.  EYES: No scleral icterus MOUTH/THROAT: Moist oral membranes.  NECK: No JVD present. No thyromegaly noted. No carotid bruits  LYMPHATIC: No visible cervical adenopathy.  CHEST Normal respiratory effort. No intercostal retractions  LUNGS: Clear to auscultation bilaterally.  No stridor. No wheezes. No rales.  CARDIOVASCULAR: Regular, positive S1-S2, no murmurs rubs or gallops appreciated. ABDOMINAL: Obese, soft, nontender, nondistended, positive bowel sounds all 4 quadrants. No apparent ascites.  EXTREMITIES: No peripheral edema  HEMATOLOGIC: No significant bruising NEUROLOGIC: Oriented to person, place, and time. Nonfocal. Normal muscle tone.  PSYCHIATRIC: Normal mood and affect. Normal behavior. Cooperative  CARDIAC DATABASE: EKG: 12/29/2019: Normal sinus rhythm, 84 bpm, right bundle branch block, left axis deviation, left atrial enlargement, poor R wave  progression, without underlying ischemia or injury pattern.  No significant change compared to prior EKG.  Echocardiogram: 01/06/2019: LVEF 55%, moderate LVH, grade 1 diastolic impairment, normal LAP, no significant valvular abnormality.  Stress Testing:  Lexiscan (Walking with mod Bruce) Sestamibi Stress Test 01/15/2020: Myocardial perfusion is normal.  Overall LV systolic function is normal without regional wall motion abnormalities. Stress LV EF: 69%.  No previous exam available for comparison. Low risk.  Heart Catheterization: None  Vascular imaging: Lower Extremity Arterial Duplex 12/21/2019:  No hemodynamically significant stenoses are identified in the bilateral lower extremity arterial system. There is diffuse heterogeneous plaque  noted in the bilateral lower extremities. This exam reveals normal perfusion of the bilateral lower extremity (ABI 1.00).   LABORATORY DATA: CBC Latest Ref Rng & Units 03/15/2019 06/28/2018 02/02/2018  WBC 3.4 - 10.8 x10E3/uL 8.3 6.9 5.7  Hemoglobin 11.1 - 15.9 g/dL 14.6 14.8 14.8  Hematocrit 34.0 - 46.6 % 44.0 43.6 43.2  Platelets 150 - 450 x10E3/uL 263 236 251    CMP Latest Ref Rng & Units 07/17/2019 03/15/2019 12/12/2018  Glucose 65 - 99 mg/dL 108(H) 100(H) 99  BUN 6 - 24 mg/dL 12 13 12   Creatinine 0.57 - 1.00 mg/dL 0.73 0.68 0.68  Sodium 134 - 144 mmol/L 137 139 139  Potassium 3.5 - 5.2 mmol/L 4.2 4.1 3.8  Chloride 96 - 106 mmol/L 101 98 103  CO2 20 - 29 mmol/L 20 25 21   Calcium 8.7 - 10.2 mg/dL 9.4 9.8 9.1  Total Protein 6.0 - 8.5 g/dL 7.1 7.3 -  Total Bilirubin 0.0 - 1.2 mg/dL 0.3 0.3 -  Alkaline Phos 39 - 117 IU/L 61 65 -  AST 0 - 40 IU/L 15 17 -  ALT 0 - 32 IU/L 19 21 -    Lipid Panel     Component Value Date/Time   CHOL 173 01/04/2020 1559   TRIG 89 01/04/2020 1559   HDL 81 01/04/2020 1559   CHOLHDL 3.0 07/17/2019 1731   LDLCALC 76 01/04/2020 1559   LDLDIRECT 65 01/04/2020 1559   LABVLDL 16 01/04/2020 1559    Lab Results    Component Value Date   HGBA1C 6.0 (H) 07/17/2019   HGBA1C 6.0 (H) 03/15/2019   No components found for: NTPROBNP Lab Results  Component Value Date   TSH 1.060 07/17/2019   TSH 0.986 02/02/2018    Cardiac Panel (last 3 results) No results for input(s): CKTOTAL, CKMB, TROPONINIHS, RELINDX in the last 72 hours.  IMPRESSION:    ICD-10-CM   1. Aortic atherosclerosis (HCC)  I70.0   2. Tobacco use  Z72.0   3. Essential hypertension  I10   4. Mixed hyperlipidemia  E78.2   5. RBBB  I45.10   6. Prediabetes  R73.03   7. Encounter to discuss test results  Z71.2      RECOMMENDATIONS: Evelyn [name] is a 59 y.o. female whose past medical history and cardiovascular risk factors include: Hypertension, abdominal aortic calcification, right bundle branch block, tobacco use disorder, benign essential hypertension.  Benign essential hypertension: . Office blood pressure is at goal.  . Medication reconciled.  . Low salt diet recommended. A diet that is rich in fruits, vegetables, legumes, and low-fat dairy products and low in snacks, sweets, and meats (such as the Dietary Approaches to Stop Hypertension [DASH] diet).   Mixed hyperlipidemia:  Continue statin therapy.  Reviewed the most recent lipid panel from July 2021.   Compared to before the non-HDL has improved from 131 to 76 mg/dL.  And direct LDL is 65.  Patient does not endorse myalgias.  Prediabetes: Currently managed by primary team.  Active tobacco use: Educated the importance of complete smoking cessation.  Aortic atherosclerosis: Continue ASA and Statin. Reviewed the results of echo and stress test with the patient.   FINAL MEDICATION LIST END OF ENCOUNTER: No orders of the defined types were placed in this encounter.    Current Outpatient Medications:  .  amLODipine (NORVASC) 10 MG tablet, TAKE 1 TABLET BY MOUTH EVERY DAY, Disp: 90 tablet, Rfl: 1 .  aspirin 81 MG chewable tablet, Chew by mouth daily., Disp: , Rfl:  .   cholecalciferol (VITAMIN D3) 25 MCG (1000 UT) tablet, Take 1,000 Units by mouth daily., Disp: , Rfl:  .  diclofenac Sodium (VOLTAREN) 1 % GEL, Apply 2 g topically 4 (four) times daily., Disp: 100 g, Rfl: 2 .  losartan-hydrochlorothiazide (HYZAAR) 100-25 MG tablet, Take 1 tablet by mouth daily., Disp: 90 tablet, Rfl: 1 .  Multiple Vitamins-Minerals (MULTIVITAMIN WOMENS 50+ ADV PO), Take by mouth daily., Disp: , Rfl:  .  prednisoLONE acetate (PRED FORTE) 1 % ophthalmic suspension, 1 drop 2 (two) times daily., Disp: , Rfl:  .  rosuvastatin (CRESTOR) 10 MG tablet, TAKE 1 TABLET BY MOUTH EVERY DAY,  Disp: 90 tablet, Rfl: 1 .  spironolactone (ALDACTONE) 25 MG tablet, Take 1 tablet (25 mg total) by mouth daily., Disp: 90 tablet, Rfl: 3 .  vitamin B-12 (CYANOCOBALAMIN) 1000 MCG tablet, Take 1,000 mcg by mouth daily., Disp: , Rfl:  .  gabapentin (NEURONTIN) 100 MG capsule, Take 100 mg by mouth at bedtime., Disp: , Rfl:   No orders of the defined types were placed in this encounter.  --Continue cardiac medications as reconciled in final medication list. --Return in about 1 year (around 02/14/2021) for BP and HLD follow up.. Or sooner if needed. --Continue follow-up with your primary care physician regarding the management of your other chronic comorbid conditions.  Patient's questions and concerns were addressed to her satisfaction. She voices understanding of the instructions provided during this encounter.   This note was created using a voice recognition software as a result there may be grammatical errors inadvertently enclosed that do not reflect the nature of this encounter. Every attempt is made to correct such errors.  Total time spent: 59min  Mechele Claude [facility]  Pager: [phone] Office: [phone]

## 2020-03-19 ENCOUNTER — Encounter: Payer: BC Managed Care – PPO | Admitting: Nurse Practitioner

## 2020-05-12 ENCOUNTER — Other Ambulatory Visit: Payer: Self-pay | Admitting: Cardiology

## 2020-06-06 ENCOUNTER — Other Ambulatory Visit: Payer: Self-pay | Admitting: Nurse Practitioner

## 2020-06-06 DIAGNOSIS — I10 Essential (primary) hypertension: Secondary | ICD-10-CM

## 2020-06-09 ENCOUNTER — Emergency Department (EMERGENCY_DEPARTMENT_HOSPITAL)
Admission: EM | Admit: 2020-06-09 | Discharge: 2020-06-10 | Disposition: A | Discharge status: Home / Self Care | Payer: BC Managed Care – PPO | Source: Home / Self Care | Attending: Emergency Medicine | Admitting: Emergency Medicine

## 2020-06-09 ENCOUNTER — Other Ambulatory Visit: Payer: Self-pay

## 2020-06-09 ENCOUNTER — Encounter (HOSPITAL_COMMUNITY): Payer: Self-pay

## 2020-06-09 DIAGNOSIS — F17210 Nicotine dependence, cigarettes, uncomplicated: Secondary | ICD-10-CM | POA: Insufficient documentation

## 2020-06-09 DIAGNOSIS — I10 Essential (primary) hypertension: Secondary | ICD-10-CM | POA: Insufficient documentation

## 2020-06-09 DIAGNOSIS — F322 Major depressive disorder, single episode, severe without psychotic features: Secondary | ICD-10-CM

## 2020-06-09 DIAGNOSIS — Z20822 Contact with and (suspected) exposure to covid-19: Secondary | ICD-10-CM | POA: Insufficient documentation

## 2020-06-09 DIAGNOSIS — R45851 Suicidal ideations: Secondary | ICD-10-CM

## 2020-06-09 DIAGNOSIS — Z79899 Other long term (current) drug therapy: Secondary | ICD-10-CM | POA: Insufficient documentation

## 2020-06-09 DIAGNOSIS — T50902A Poisoning by unspecified drugs, medicaments and biological substances, intentional self-harm, initial encounter: Secondary | ICD-10-CM

## 2020-06-09 DIAGNOSIS — T4272XA Poisoning by unspecified antiepileptic and sedative-hypnotic drugs, intentional self-harm, initial encounter: Secondary | ICD-10-CM | POA: Insufficient documentation

## 2020-06-09 DIAGNOSIS — Z7982 Long term (current) use of aspirin: Secondary | ICD-10-CM | POA: Insufficient documentation

## 2020-06-09 LAB — CBC WITH DIFFERENTIAL/PLATELET
Abs Immature Granulocytes: 0.05 10*3/uL (ref 0.00–0.07)
Basophils Absolute: 0.1 10*3/uL (ref 0.0–0.1)
Basophils Relative: 1 %
Eosinophils Absolute: 0.4 10*3/uL (ref 0.0–0.5)
Eosinophils Relative: 4 %
HCT: 46.2 % — ABNORMAL HIGH (ref 36.0–46.0)
Hemoglobin: 15.8 g/dL — ABNORMAL HIGH (ref 12.0–15.0)
Immature Granulocytes: 1 %
Lymphocytes Relative: 23 %
Lymphs Abs: 2.1 10*3/uL (ref 0.7–4.0)
MCH: 28.4 pg (ref 26.0–34.0)
MCHC: 34.2 g/dL (ref 30.0–36.0)
MCV: 83.1 fL (ref 80.0–100.0)
Monocytes Absolute: 0.8 10*3/uL (ref 0.1–1.0)
Monocytes Relative: 9 %
Neutro Abs: 5.7 10*3/uL (ref 1.7–7.7)
Neutrophils Relative %: 62 %
Platelets: 293 10*3/uL (ref 150–400)
RBC: 5.56 MIL/uL — ABNORMAL HIGH (ref 3.87–5.11)
RDW: 14.1 % (ref 11.5–15.5)
WBC: 9.1 10*3/uL (ref 4.0–10.5)
nRBC: 0 % (ref 0.0–0.2)

## 2020-06-09 LAB — I-STAT CHEM 8, ED
BUN: 24 mg/dL — ABNORMAL HIGH (ref 6–20)
Calcium, Ion: 1.15 mmol/L (ref 1.15–1.40)
Chloride: 107 mmol/L (ref 98–111)
Creatinine, Ser: 1.2 mg/dL — ABNORMAL HIGH (ref 0.44–1.00)
Glucose, Bld: 68 mg/dL — ABNORMAL LOW (ref 70–99)
HCT: 49 % — ABNORMAL HIGH (ref 36.0–46.0)
Hemoglobin: 16.7 g/dL — ABNORMAL HIGH (ref 12.0–15.0)
Potassium: 4.5 mmol/L (ref 3.5–5.1)
Sodium: 136 mmol/L (ref 135–145)
TCO2: 22 mmol/L (ref 22–32)

## 2020-06-09 LAB — COMPREHENSIVE METABOLIC PANEL
ALT: 27 U/L (ref 0–44)
AST: 20 U/L (ref 15–41)
Albumin: 4.7 g/dL (ref 3.5–5.0)
Alkaline Phosphatase: 54 U/L (ref 38–126)
Anion gap: 13 (ref 5–15)
BUN: 19 mg/dL (ref 6–20)
CO2: 20 mmol/L — ABNORMAL LOW (ref 22–32)
Calcium: 9.6 mg/dL (ref 8.9–10.3)
Chloride: 103 mmol/L (ref 98–111)
Creatinine, Ser: 1.25 mg/dL — ABNORMAL HIGH (ref 0.44–1.00)
GFR, Estimated: 50 mL/min — ABNORMAL LOW (ref >60)
Glucose, Bld: 69 mg/dL — ABNORMAL LOW (ref 70–99)
Potassium: 3.7 mmol/L (ref 3.5–5.1)
Sodium: 136 mmol/L (ref 135–145)
Total Bilirubin: 0.4 mg/dL (ref 0.3–1.2)
Total Protein: 8.2 g/dL — ABNORMAL HIGH (ref 6.5–8.1)

## 2020-06-09 LAB — RAPID URINE DRUG SCREEN, HOSP PERFORMED
Amphetamines: NOT DETECTED
Barbiturates: NOT DETECTED
Benzodiazepines: NOT DETECTED
Cocaine: POSITIVE — AB
Opiates: NOT DETECTED
Tetrahydrocannabinol: POSITIVE — AB

## 2020-06-09 LAB — I-STAT BETA HCG BLOOD, ED (MC, WL, AP ONLY): I-stat hCG, quantitative: <5 m[IU]/mL (ref <5)

## 2020-06-09 LAB — SALICYLATE LEVEL: Salicylate Lvl: <7 mg/dL — ABNORMAL LOW (ref 7.0–30.0)

## 2020-06-09 LAB — RESP PANEL BY RT-PCR (FLU A&B, COVID) ARPGX2
Influenza A by PCR: NEGATIVE
Influenza B by PCR: NEGATIVE
SARS Coronavirus 2 by RT PCR: NEGATIVE

## 2020-06-09 LAB — CBG MONITORING, ED
Glucose-Capillary: 60 mg/dL — ABNORMAL LOW (ref 70–99)
Glucose-Capillary: 297 mg/dL — ABNORMAL HIGH (ref 70–99)

## 2020-06-09 LAB — ETHANOL: Alcohol, Ethyl (B): <10 mg/dL (ref <10)

## 2020-06-09 LAB — ACETAMINOPHEN LEVEL
Acetaminophen (Tylenol), Serum: <10 ug/mL — ABNORMAL LOW (ref 10–30)
Acetaminophen (Tylenol), Serum: <10 ug/mL — ABNORMAL LOW (ref 10–30)

## 2020-06-09 MED ORDER — DEXTROSE 50 % IV SOLN
1.0000 | Freq: Once | INTRAVENOUS | Status: AC
  Administered 2020-06-09: 50 mL via INTRAVENOUS
  Filled 2020-06-09: qty 50

## 2020-06-09 MED ORDER — SODIUM CHLORIDE 0.9% FLUSH
3.0000 mL | Freq: Two times a day (BID) | INTRAVENOUS | Status: DC
Start: 2020-06-09 — End: 2020-06-10
  Administered 2020-06-09: 3 mL via INTRAVENOUS

## 2020-06-09 MED ORDER — SODIUM CHLORIDE 0.9% FLUSH: 3.0000 mL | INTRAVENOUS | Status: DC | PRN

## 2020-06-09 MED ORDER — GLUCOSE 4 G PO CHEW
1.0000 | CHEWABLE_TABLET | Freq: Once | ORAL | Status: AC
  Administered 2020-06-09: 4 g via ORAL
  Filled 2020-06-09: qty 1

## 2020-06-09 MED ORDER — SODIUM CHLORIDE 0.9 % IV BOLUS
500.0000 mL | Freq: Once | INTRAVENOUS | Status: AC
  Administered 2020-06-09: 500 mL via INTRAVENOUS

## 2020-06-09 MED ORDER — SODIUM CHLORIDE 0.9 % IV SOLN: 250.0000 mL | INTRAVENOUS | Status: DC | PRN

## 2020-06-09 NOTE — ED Triage Notes (Signed)
Pt sent over from [facility]. Was seen there this morning. Pt took 30 unknown pills and 8 pills last night. SI, mother passed away couple weeks ago and she has been under a lot of stress
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
## 2020-06-09 NOTE — ED Notes (Signed)
Pt asleep at this time, safety sitter at bedside

## 2020-06-09 NOTE — ED Notes (Signed)
Pt escorted to restroom by sitter to provide urine sample

## 2020-06-09 NOTE — ED Notes (Signed)
Pt belongings in cabinet above nurse station. Pt dressed out in purple scrubs. Pt assisted to restroom but did not provide a UA at this time.

## 2020-06-09 NOTE — ED Notes (Signed)
Pt in bed, respirations even and unlabored. No s/s of distress. Sitter at bedside.

## 2020-06-09 NOTE — ED Notes (Signed)
Pt asleep and resting comfortably with sitter at the bedside

## 2020-06-09 NOTE — ED Provider Notes (Signed)
[facility] DEPT Provider Note   CSN: [id number] Arrival date & time: 06/09/20  0321     History Chief Complaint  Patient presents with  . Suicidal  . Drug Overdose    [facility] [name] is a 59 y.o. female history of hypertension, hyperlipidemia, aortic calcifications, anxiety, smoker.  Patient arrives following attempted suicide via overdose.  She reports that she has been under increasing stress over the past few weeks but does not want to discuss her life stressors with me during this evaluation.  She reports that last night she took 8 unknown sleeping pills to attempt to take her life.  She woke up this morning and at 7:30 AM she took 30 more pills in attempt to kill herself.  She does not know what these pills were but reports that they were not melatonin.   Patient reports that she is feeling a little tired but overall well at this time she is tearful.  She denies fever/chills, recent illness, injury/trauma, homicidal ideations, hallucinations, drug/alcohol use, abdominal pain, nausea/vomiting, diarrhea, chest pain/shortness of breath or any additional concerns.  HPI     Past Medical History:  Diagnosis Date  . Anxiety   . Arthritis   . Blood transfusion without reported diagnosis   . Calcification of abdominal aorta ([facility]) 11/11/2017  . Hyperlipidemia   . Hypertension     Patient Active Problem List   Diagnosis Date Noted  . Calcification of abdominal aorta ([facility]) 11/11/2017  . Hypertension     Past Surgical History:  Procedure Laterality Date  . CESAREAN SECTION     x2  . FOOT SURGERY Left    Hammmer Toe  . TUBAL LIGATION       OB History   No obstetric history on file.     Family History  Problem Relation Age of Onset  . Diabetes Mother   . COPD Mother   . Renal Disease Mother   . Heart attack Father 56  . Stroke Brother   . Colon cancer Neg Hx   . Esophageal cancer Neg Hx   . Liver cancer Neg Hx   . Pancreatic cancer  Neg Hx   . Rectal cancer Neg Hx   . Stomach cancer Neg Hx     Social History   Tobacco Use  . Smoking status: Current Every Day Smoker    Packs/day: 0.25    Years: 22.00    Pack years: 5.50    Types: Cigarettes  . Smokeless tobacco: Never Used  . Tobacco comment: she is down to 2-3 cigarettes per day  Vaping Use  . Vaping Use: Never used  Substance Use Topics  . Alcohol use: Yes    Alcohol/week: 2.0 standard drinks    Types: 2 Cans of beer per week    Comment: Beer 2 daily  . Drug use: Yes    Types: Marijuana    Comment: Episodic use of marijuana    Home Medications Prior to Admission medications   Medication Sig Start Date End Date Taking? Authorizing Provider  amLODipine (NORVASC) 10 MG tablet TAKE 1 TABLET BY MOUTH EVERY DAY Patient taking differently: Take 10 mg by mouth daily. 02/12/20  Yes Tolia, Sunit, DO  aspirin 81 MG chewable tablet Chew 81 mg by mouth daily.   Yes [provider]  cholecalciferol (VITAMIN D3) 25 MCG (1000 UT) tablet Take 1,000 Units by mouth daily.   Yes [provider]  diclofenac Sodium (VOLTAREN) 1 % GEL Apply 2  g topically 4 (four) times daily. Patient taking differently: Apply 2 g topically 4 (four) times daily as needed (pain). 07/17/19  Yes [name], FNP  gabapentin (NEURONTIN) 100 MG capsule Take 100 mg by mouth at bedtime. 01/12/20  Yes [provider]  losartan-hydrochlorothiazide (HYZAAR) 100-25 MG tablet TAKE 1 TABLET BY MOUTH EVERY DAY Patient taking differently: Take 1 tablet by mouth daily. 06/06/20  Yes [name], FNP  Multiple Vitamins-Minerals (MULTIVITAMIN WOMENS 50+ ADV PO) Take 1 tablet by mouth daily.   Yes [provider]  rosuvastatin (CRESTOR) 10 MG tablet TAKE 1 TABLET BY MOUTH EVERY DAY Patient taking differently: Take 10 mg by mouth daily. 05/13/20  Yes Tolia, Sunit, DO  spironolactone (ALDACTONE) 25 MG tablet Take 1 tablet (25 mg total) by mouth daily. 08/08/19  Yes Miquel Dunn, NP  vitamin B-12 (CYANOCOBALAMIN) 1000 MCG tablet Take 1,000 mcg by mouth daily.   Yes [provider]    Allergies    Lisinopril  Review of Systems   Review of Systems Ten systems are reviewed and are negative for acute change except as noted in the HPI  Physical Exam Updated Vital Signs BP (!) 144/89   Pulse 85   Temp 98.4 F (36.9 C) (Oral)   Resp (!) 24   LMP 07/16/2017   SpO2 98%   Physical Exam Constitutional:      General: She is not in acute distress.    Appearance: Normal appearance. She is well-developed. She is not ill-appearing or diaphoretic.  HENT:     Head: Normocephalic and atraumatic.  Eyes:     General: Vision grossly intact. Gaze aligned appropriately.     Pupils: Pupils are equal, round, and reactive to light.  Neck:     Trachea: Trachea and phonation normal.  Cardiovascular:     Rate and Rhythm: Normal rate and regular rhythm.     Pulses:          Radial pulses are 2+ on the right side and 2+ on the left side.  Pulmonary:     Effort: Pulmonary effort is normal. No respiratory distress.  Abdominal:     General: There is no distension.     Palpations: Abdomen is soft.     Tenderness: There is no abdominal tenderness. There is no guarding or rebound.  Musculoskeletal:        General: Normal range of motion.     Cervical back: Normal range of motion.  Skin:    General: Skin is warm and dry.  Neurological:     Mental Status: She is alert.     GCS: GCS eye subscore is 4. GCS verbal subscore is 5. GCS motor subscore is 6.     Comments: Speech is clear and goal oriented, follows commands Major Cranial nerves without deficit, no facial droop Moves extremities without ataxia, coordination intact  Psychiatric:        Attention and Perception: She does not perceive auditory or visual hallucinations.        Mood and Affect: Mood is depressed. Affect is tearful.        Behavior: Behavior is withdrawn. Behavior is cooperative.         Thought Content: Thought content includes suicidal ideation. Thought content does not include homicidal ideation. Thought content includes suicidal plan.     ED Results / Procedures / Treatments   Labs (all labs ordered are listed, but only abnormal results are displayed) Labs Reviewed  COMPREHENSIVE METABOLIC PANEL -  Abnormal; Notable for the following components:      Result Value   CO2 20 (*)    Glucose, Bld 69 (*)    Creatinine, Ser 1.25 (*)    Total Protein 8.2 (*)    GFR, Estimated 50 (*)    All other components within normal limits  CBC WITH DIFFERENTIAL/PLATELET - Abnormal; Notable for the following components:   RBC 5.56 (*)    Hemoglobin 15.8 (*)    HCT 46.2 (*)    All other components within normal limits  ACETAMINOPHEN LEVEL - Abnormal; Notable for the following components:   Acetaminophen (Tylenol), Serum <10 (*)    All other components within normal limits  SALICYLATE LEVEL - Abnormal; Notable for the following components:   Salicylate Lvl <0.5 (*)    All other components within normal limits  ACETAMINOPHEN LEVEL - Abnormal; Notable for the following components:   Acetaminophen (Tylenol), Serum <10 (*)    All other components within normal limits  I-STAT CHEM 8, ED - Abnormal; Notable for the following components:   BUN 24 (*)    Creatinine, Ser 1.20 (*)    Glucose, Bld 68 (*)    Hemoglobin 16.7 (*)    HCT 49.0 (*)    All other components within normal limits  CBG MONITORING, ED - Abnormal; Notable for the following components:   Glucose-Capillary 60 (*)    All other components within normal limits  CBG MONITORING, ED - Abnormal; Notable for the following components:   Glucose-Capillary 297 (*)    All other components within normal limits  RESP PANEL BY RT-PCR (FLU A&B, COVID) ARPGX2  ETHANOL  RAPID URINE DRUG SCREEN, HOSP PERFORMED  I-STAT BETA HCG BLOOD, ED ([facility], [facility], [facility] ONLY)  CBG MONITORING, ED    EKG EKG Interpretation  Date/Time:  Sunday  June 09 2020 12:07:59 EST Ventricular Rate:  93 PR Interval:  146 QRS Duration: 122 QT Interval:  388 QTC Calculation: 482 R Axis:   -154 Text Interpretation: Normal sinus rhythm Right atrial enlargement Right bundle branch block Lateral infarct , age undetermined Inferior infarct , age undetermined Abnormal ECG Since last tracing RBBB is new Otherwise no significant change Confirmed by Daleen Bo [phone]) on 06/09/2020 4:29:46 PM   Radiology No results found.  Procedures Procedures (including critical care time)  Medications Ordered in ED Medications  sodium chloride flush (NS) 0.9 % injection 3 mL (3 mLs Intravenous Given 06/09/20 1301)  sodium chloride flush (NS) 0.9 % injection 3 mL (has no administration in time range)  0.9 %  sodium chloride infusion (has no administration in time range)  glucose chewable tablet 4 g (4 g Oral Given 06/09/20 1301)  dextrose 50 % solution 50 mL (50 mLs Intravenous Given 06/09/20 1258)  sodium chloride 0.9 % bolus 500 mL (500 mLs Intravenous New Bag/Given 06/09/20 1542)    ED Course  I have reviewed the triage vital signs and the nursing notes.  Pertinent labs & imaging results that were available during my care of the patient were reviewed by me and considered in my medical decision making (see chart for details).  Clinical Course as of 06/09/20 1710  Sun Jun 09, 2020  1633 Ebony Hail [BM]  3419 December 12, 2018: ECG Curlene Dolphin Interpretation sinus rhythm, right bundle branch block, right atrial enlargement Rate 79 PR 170 QTc 439 QRS 134 P axis 57 QRS axis -24 T axes 61 [BM]  1647 Allison [BM]    Clinical Course User Index [BM]  Gari Crown   MDM Rules/Calculators/A&P                         Additional history obtained from: 1. Nursing notes from this visit. 2. Review of electronic medical records. ------------------------------ 89 old female presents after an unknown sleeping medication overdose at 7:30 AM this  morning.  Experiencing some drowsiness but otherwise feels well.  Vital signs stable on arrival.  11:49 AM: Consulted [employer] [facility] and spoke with nurse Ebony Hail.  Advised obtaining basic labs including Tylenol level and observing for 6 hours since ingestion.  Pending negative work-up patient to be cleared after 2 hours observation for now. - I ordered, reviewed and interpreted labs which include: I-STAT Chem-8 showed low glucose level, patient was given juice with improvement. Pregnancy test negative. Covid/influenza panel negative Initial Tylenol level negative Salicylate level negative Ethanol negative, no evidence of intoxication or withdrawal CMP shows no emergent electrolyte derangement LFT elevations or gap.  Mild elevation of creatinine at 1.25 suspicious for dehydration patient given IV fluids.   CBC without leukocytosis or anemia. Vital signs stable without tachycardia.  On reassessment patient is resting comfortably no acute distress alert and oriented x4.  Patient with decreased p.o. intake over the past few days suspect this is the etiology of evidence of dehydration and slightly decreased bicarb on CMP.  No evidence for diabetic emergency or severe acidosis.  Additionally suspect decreased p.o. intake is the cause of mild hypoglycemia earlier.  Patient will begin normal diet which should improve mild abnormalities  Case reviewed with Dr. Ron Parker, will give an unknown pills taken earlier will obtain a second Tylenol level when medically clear. - Delta Tylenol level is negative. EKG reviewed with Dr. Eulis Foster, initially EKG was concerning for possibly new right bundle branch block however on further chart review EKG from December 12, 2018 shows right bundle branch block.  This was discussed with Ebony Hail from poison control, given this is a stable finding that there is no indication for further work-up medically cleared from overdose standpoint.  I reassessed the patient she is  awake alert resting comfortably no acute distress vital signs stable on room air.  Psychiatry has recommended inpatient placement.  Patient is agreeable to plan.  She is requesting food, meal tray ordered.  At this time there does not appear to be any evidence of an acute emergency medical condition and the patient appears stable for psychiatric disposition. Patient placed in psych hold.  Note: Portions of this report may have been transcribed using voice recognition software. Every effort was made to ensure accuracy; however, inadvertent computerized transcription errors may still be present. Final Clinical Impression(s) / ED Diagnoses Final diagnoses:  Suicidal ideation  Medication overdose, intentional self-harm, initial encounter [facility])    Rx / DC Orders ED Discharge Orders    None       Gari Crown 06/09/20 1710    Breck Coons, MD 06/10/20 0800

## 2020-06-10 ENCOUNTER — Encounter (HOSPITAL_COMMUNITY): Payer: Self-pay | Admitting: Physician Assistant

## 2020-06-10 ENCOUNTER — Inpatient Hospital Stay (HOSPITAL_COMMUNITY)
Admission: AD | Admit: 2020-06-10 | Discharge: 2020-06-13 | DRG: 885 | Disposition: A | Discharge status: Home / Self Care | Payer: BC Managed Care – PPO | Attending: Psychiatry | Admitting: Psychiatry

## 2020-06-10 ENCOUNTER — Other Ambulatory Visit: Payer: Self-pay | Admitting: Physician Assistant

## 2020-06-10 ENCOUNTER — Other Ambulatory Visit: Payer: Self-pay

## 2020-06-10 DIAGNOSIS — T6592XA Toxic effect of unspecified substance, intentional self-harm, initial encounter: Secondary | ICD-10-CM

## 2020-06-10 DIAGNOSIS — Z20822 Contact with and (suspected) exposure to covid-19: Secondary | ICD-10-CM | POA: Diagnosis present

## 2020-06-10 DIAGNOSIS — F322 Major depressive disorder, single episode, severe without psychotic features: Secondary | ICD-10-CM | POA: Diagnosis present

## 2020-06-10 DIAGNOSIS — I4510 Unspecified right bundle-branch block: Secondary | ICD-10-CM | POA: Diagnosis present

## 2020-06-10 DIAGNOSIS — T50902A Poisoning by unspecified drugs, medicaments and biological substances, intentional self-harm, initial encounter: Secondary | ICD-10-CM | POA: Diagnosis present

## 2020-06-10 DIAGNOSIS — Z7982 Long term (current) use of aspirin: Secondary | ICD-10-CM | POA: Diagnosis not present

## 2020-06-10 DIAGNOSIS — F17210 Nicotine dependence, cigarettes, uncomplicated: Secondary | ICD-10-CM | POA: Diagnosis present

## 2020-06-10 DIAGNOSIS — G4700 Insomnia, unspecified: Secondary | ICD-10-CM | POA: Diagnosis present

## 2020-06-10 DIAGNOSIS — T50901A Poisoning by unspecified drugs, medicaments and biological substances, accidental (unintentional), initial encounter: Secondary | ICD-10-CM | POA: Diagnosis present

## 2020-06-10 DIAGNOSIS — I10 Essential (primary) hypertension: Secondary | ICD-10-CM | POA: Diagnosis present

## 2020-06-10 LAB — LIPID PANEL
Cholesterol: 218 mg/dL — ABNORMAL HIGH (ref 0–200)
HDL: 57 mg/dL (ref >40)
LDL Cholesterol: 111 mg/dL — ABNORMAL HIGH (ref 0–99)
Total CHOL/HDL Ratio: 3.8 RATIO
Triglycerides: 249 mg/dL — ABNORMAL HIGH (ref <150)
VLDL: 50 mg/dL — ABNORMAL HIGH (ref 0–40)

## 2020-06-10 LAB — HEMOGLOBIN A1C
Hgb A1c MFr Bld: 6.5 % — ABNORMAL HIGH (ref 4.8–5.6)
Mean Plasma Glucose: 139.85 mg/dL

## 2020-06-10 LAB — TSH: TSH: 0.948 u[IU]/mL (ref 0.350–4.500)

## 2020-06-10 MED ORDER — HYDROXYZINE HCL 25 MG PO TABS
25.0000 mg | ORAL_TABLET | Freq: Three times a day (TID) | ORAL | Status: DC | PRN
  Administered 2020-06-11: 25 mg via ORAL
  Filled 2020-06-10 (×3): qty 1

## 2020-06-10 MED ORDER — SPIRONOLACTONE 25 MG PO TABS
25.0000 mg | ORAL_TABLET | Freq: Every day | ORAL | Status: DC
  Administered 2020-06-11 – 2020-06-13 (×3): 25 mg via ORAL
  Filled 2020-06-10 (×4): qty 1

## 2020-06-10 MED ORDER — AMLODIPINE BESYLATE 10 MG PO TABS
10.0000 mg | ORAL_TABLET | Freq: Every day | ORAL | Status: DC
  Administered 2020-06-10 – 2020-06-13 (×4): 10 mg via ORAL
  Filled 2020-06-10 (×5): qty 1

## 2020-06-10 MED ORDER — ROSUVASTATIN CALCIUM 10 MG PO TABS
10.0000 mg | ORAL_TABLET | Freq: Every day | ORAL | Status: DC
  Administered 2020-06-10 – 2020-06-13 (×4): 10 mg via ORAL
  Filled 2020-06-10 (×5): qty 1

## 2020-06-10 MED ORDER — LOSARTAN POTASSIUM 50 MG PO TABS
100.0000 mg | ORAL_TABLET | Freq: Every day | ORAL | Status: DC
  Administered 2020-06-11 – 2020-06-13 (×3): 100 mg via ORAL
  Filled 2020-06-10 (×5): qty 2

## 2020-06-10 MED ORDER — VITAMIN B-12 1000 MCG PO TABS
1000.0000 ug | ORAL_TABLET | Freq: Every day | ORAL | Status: DC
  Administered 2020-06-11 – 2020-06-13 (×3): 1000 ug via ORAL
  Filled 2020-06-10 (×5): qty 1

## 2020-06-10 MED ORDER — ESCITALOPRAM OXALATE 10 MG PO TABS
10.0000 mg | ORAL_TABLET | Freq: Every day | ORAL | Status: DC
  Administered 2020-06-10 – 2020-06-12 (×3): 10 mg via ORAL
  Filled 2020-06-10 (×4): qty 1

## 2020-06-10 MED ORDER — ACETAMINOPHEN 325 MG PO TABS: 650.0000 mg | ORAL_TABLET | Freq: Four times a day (QID) | ORAL | Status: DC | PRN

## 2020-06-10 MED ORDER — ASPIRIN 81 MG PO CHEW
81.0000 mg | CHEWABLE_TABLET | Freq: Every day | ORAL | Status: DC
  Administered 2020-06-10 – 2020-06-13 (×4): 81 mg via ORAL
  Filled 2020-06-10 (×5): qty 1

## 2020-06-10 MED ORDER — TRAZODONE HCL 50 MG PO TABS
50.0000 mg | ORAL_TABLET | Freq: Every evening | ORAL | Status: DC | PRN
  Administered 2020-06-10: 50 mg via ORAL
  Filled 2020-06-10 (×2): qty 1

## 2020-06-10 MED ORDER — ALUM & MAG HYDROXIDE-SIMETH 200-200-20 MG/5ML PO SUSP: 30.0000 mL | ORAL | Status: DC | PRN

## 2020-06-10 MED ORDER — MAGNESIUM HYDROXIDE 400 MG/5ML PO SUSP: 30.0000 mL | Freq: Every day | ORAL | Status: DC | PRN

## 2020-06-10 MED ORDER — HYDROCHLOROTHIAZIDE 25 MG PO TABS
25.0000 mg | ORAL_TABLET | Freq: Every day | ORAL | Status: DC
  Administered 2020-06-11 – 2020-06-13 (×3): 25 mg via ORAL
  Filled 2020-06-10 (×5): qty 1

## 2020-06-10 MED ORDER — VITAMIN D3 25 MCG PO TABS
1000.0000 [IU] | ORAL_TABLET | Freq: Every day | ORAL | Status: DC
  Administered 2020-06-10 – 2020-06-13 (×4): 1000 [IU] via ORAL
  Filled 2020-06-10 (×5): qty 1

## 2020-06-10 MED ORDER — CYCLOSPORINE 0.05 % OP EMUL
1.0000 [drp] | Freq: Two times a day (BID) | OPHTHALMIC | Status: DC
  Administered 2020-06-10 – 2020-06-13 (×7): 1 [drp] via OPHTHALMIC

## 2020-06-10 NOTE — ED Notes (Signed)
[facility] called pt to be transported via safe transport tonight to room 307-1

## 2020-06-10 NOTE — Progress Notes (Signed)
Adult Psychoeducational Group Note  Date:  06/10/2020 Time:  7:10 PM  Group Topic/Focus:  Building Self Esteem:   The Focus of this group is helping patients become aware of the effects of self-esteem on their lives, the things they and others do that enhance or undermine their self-esteem, seeing the relationship between their level of self-esteem and the choices they make and learning ways to enhance self-esteem.  Participation Level:  Active  Participation Quality:  Appropriate  Affect:  Appropriate  Cognitive:  Alert  Insight: Appropriate  Engagement in Group:  Engaged  Modes of Intervention:  Discussion  Additional Comments:  Pt attended group and participated in discussion.  Sabryn Preslar R Slade Pierpoint 06/10/2020, 7:10 PM

## 2020-06-10 NOTE — H&P (Signed)
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 (major depressive disorder), single episode, severe ([place]) Active Problems:   Suicide attempt by substance overdose ([place])  Long Term Goal(s): Improvement in symptoms so as ready for discharge  Short Term Goals: Ability to identify changes in lifestyle to reduce recurrence of condition will improve, Ability to verbalize feelings will improve, Ability to disclose and discuss suicidal ideas, Ability to demonstrate self-control will improve, Ability to identify and develop effective coping behaviors will improve and Ability to identify triggers associated with substance abuse/mental health issues will improve  I certify that inpatient services furnished  can reasonably be expected to improve the patient's condition.    Briant Cedar, MD 12/20/20213:19 PM

## 2020-06-10 NOTE — ED Notes (Signed)
Report called to [facility] for pt transfer

## 2020-06-10 NOTE — BHH Counselor (Signed)
Per Shana Chute, RN pt has been accepted to [facility] and assigned to room/bed: 307-1. Pt can come now. Attending physician: Dr. Mallie Darting. Updated disposition discussed with Lorre Nick, RN. RN to discussed disposition with EDP.     Vertell Novak, [place], [facility], [facility] Triage Specialist [phone]

## 2020-06-10 NOTE — BHH Group Notes (Signed)
Occupational Therapy Group Note Date: 06/10/2020 Group Topic/Focus: Stress Management  Group Description: Group encouraged increased participation and engagement through discussion focused on topic of stress management. Patients engaged interactively to discuss components of stress including physical signs, emotional signs, negative management strategies, and positive management strategies. Each individual identified one new stress management strategy they would like to try moving forward.    Therapeutic Goals: Identify current stressors Identify healthy vs unhealthy stress management strategies/techniques Discuss and identify physical and emotional signs of stress Participation Level: Active   Participation Quality: Independent   Behavior: Calm, Cooperative and Interactive   Speech/Thought Process: Focused   Affect/Mood: Euthymic   Insight: Moderate   Judgement: Moderate   Individualization: Evelyn [name] was active and independent in her participation of discussion and activity. She identified some things that are stressful to her such as "finances" Receptive to education and strategies reviewed for improving stress management in a healthier dynamic.   Modes of Intervention: Discussion, Education, Socialization and Support  Patient Response to Interventions:  Attentive, Engaged, Receptive and Interested   Plan: Continue to engage patient in OT groups 2 - 3x/week.   06/10/2020  Ponciano Ort, MOT, OTR/L

## 2020-06-10 NOTE — Plan of Care (Signed)
Nurse discussed anxiety, depression, coping skills with patient. 

## 2020-06-10 NOTE — Progress Notes (Signed)
Inpatient Diabetes Program Recommendations  AACE/ADA: New Consensus Statement on Inpatient Glycemic Control (2015)  Target Ranges:  Prepandial:   less than 140 mg/dL      Peak postprandial:   less than 180 mg/dL (1-2 hours)      Critically ill patients:  140 - 180 mg/dL   Lab Results  Component Value Date   GLUCAP 297 (H) 06/09/2020   HGBA1C 6.0 (H) 07/17/2019    Review of Glycemic Control  Diabetes history: Pre-diabetes with HgbA1C 6.0% on 07/17/19. Outpatient Diabetes medications: None Current orders for Inpatient glycemic control: None  HgbA1C pending. Had hypoglycemia in 72s yesterday. Has family hx DM.  Inpatient Diabetes Program Recommendations:     Add Novolog 0-6 units tidwc.   Will need PCP to manage her pre-diabetes. Would benefit from lifestyle modification with exercise and weight loss to improve glycemic control   Will follow.  Thank you. Lorenda Peck, RD, LDN, CDE Inpatient Diabetes Coordinator [phone]

## 2020-06-10 NOTE — Progress Notes (Signed)
D:  Patient's self inventory sheet, patient denied SI and HI, contracts for safety.  Denied A/V hallucinations. A:  Medications administered per MD orders.  Emotional support and encouragement given patient. R:  Safety maintained with 15 minute checks.  Patient very tearful this morning but did not stay in bed.  Patient encouraged by staff and peers to attend groups which she did.  Patient offered vistaril for anxiety throughout the day, but patient refused.  Patient has been seen several times throughout the day smiling and talking to peers/staff.

## 2020-06-10 NOTE — ED Notes (Signed)
Safe transport called for pt transfer

## 2020-06-10 NOTE — Progress Notes (Signed)
D: Patient presents with sad and tearful affect but is pleasant and cooperative upon assessment and interaction. Patient reports feeling overwhelmed with life. Patient denies SI/HI at this time. Patient also denies AH/VH at this time. Patient contracts for safety.  A: Provided positive reinforcement and encouragement.  R: Patient cooperative and receptive to efforts. Patient remains safe on the unit.  06/10/20 1933  Psych Admission Type (Psych Patients Only)  Admission Status Voluntary  Psychosocial Assessment  Patient Complaints Anxiety;Depression;Crying spells  Eye Contact Fair  Facial Expression Sad  Affect Depressed;Sad  Speech Logical/coherent  Interaction Assertive  Motor Activity Slow  Appearance/Hygiene Disheveled;In scrubs  Behavior Characteristics Cooperative;Anxious  Mood Depressed;Anxious;Apprehensive  Thought Process  Coherency WDL  Content WDL  Delusions None reported or observed  Perception WDL  Hallucination None reported or observed  Judgment UTA  Confusion None  Danger to Self  Current suicidal ideation? Denies  Self-Injurious Behavior No self-injurious ideation or behavior indicators observed or expressed   Agreement Not to Harm Self Yes  Description of Agreement Verbal Contract  Danger to Others  Danger to Others None reported or observed

## 2020-06-10 NOTE — Progress Notes (Signed)
Recreation Therapy Notes  Date:  12.20.21 Time: 0930 Location: 300 Hall Group Room  Group Topic: Stress Management  Goal Area(s) Addresses:  Patient will identify positive stress management techniques. Patient will identify benefits of using stress management post d/c.  Behavioral Response: Engaged  Intervention: Stress Management  Activity: Meditation.  LRT played a meditation that focused on mindful listening.  The meditation focused on how to be present when others are speaking to you.  The meditation talked about having an open mind and not engaging to correct or confront the person but just hear them out.  Patients were to follow along as the meditation played to engage in the activity.      Education:  Stress Management, Discharge Planning.   Education Outcome: Acknowledges Education  Clinical Observations/Feedback:  Pt attended and participated in activity.      [name], LRT/CTRS         [name] A 06/10/2020 10:58 AM

## 2020-06-10 NOTE — Tx Team (Signed)
Interdisciplinary Treatment and Diagnostic Plan Update  06/10/2020 Time of Session: 9:05am Evelyn [name] MRN: [id number]  Principal Diagnosis: <principal problem not specified>  Secondary Diagnoses: Active Problems:   MDD (major depressive disorder), single episode, severe (H[facility])   Current Medications:  Current Facility-Administered Medications  Medication Dose Route Frequency Provider Last Rate Last Admin  . acetaminophen (TYLENOL) tablet 650 mg  650 mg Oral Q6H PRN Nwoko, Uchenna E, PA      . alum & mag hydroxide-simeth (MAALOX/MYLANTA) 200-200-20 MG/5ML suspension 30 mL  30 mL Oral Q4H PRN Nwoko, Uchenna E, PA      . cycloSPORINE (RESTASIS) 0.05 % ophthalmic emulsion 1 drop  1 drop Both Eyes BID Sharma Covert, MD   1 drop at 06/10/20 0900  . hydrOXYzine (ATARAX/VISTARIL) tablet 25 mg  25 mg Oral TID PRN Nwoko, Uchenna E, PA      . magnesium hydroxide (MILK OF MAGNESIA) suspension 30 mL  30 mL Oral Daily PRN Nwoko, Uchenna E, PA      . traZODone (DESYREL) tablet 50 mg  50 mg Oral QHS PRN Nwoko, Uchenna E, PA       PTA Medications: Medications Prior to Admission  Medication Sig Dispense Refill Last Dose  . amLODipine (NORVASC) 10 MG tablet TAKE 1 TABLET BY MOUTH EVERY DAY (Patient taking differently: Take 10 mg by mouth daily.) 90 tablet 1   . aspirin 81 MG chewable tablet Chew 81 mg by mouth daily.     . cholecalciferol (VITAMIN D3) 25 MCG (1000 UT) tablet Take 1,000 Units by mouth daily.     . diclofenac Sodium (VOLTAREN) 1 % GEL Apply 2 g topically 4 (four) times daily. (Patient taking differently: Apply 2 g topically 4 (four) times daily as needed (pain).) 100 g 2   . gabapentin (NEURONTIN) 100 MG capsule Take 100 mg by mouth at bedtime.     Marland Kitchen losartan-hydrochlorothiazide (HYZAAR) 100-25 MG tablet TAKE 1 TABLET BY MOUTH EVERY DAY (Patient taking differently: Take 1 tablet by mouth daily.) 90 tablet 1   . Multiple Vitamins-Minerals (MULTIVITAMIN WOMENS 50+ ADV PO) Take 1 tablet  by mouth daily.     . rosuvastatin (CRESTOR) 10 MG tablet TAKE 1 TABLET BY MOUTH EVERY DAY (Patient taking differently: Take 10 mg by mouth daily.) 90 tablet 1   . spironolactone (ALDACTONE) 25 MG tablet Take 1 tablet (25 mg total) by mouth daily. 90 tablet 3   . vitamin B-12 (CYANOCOBALAMIN) 1000 MCG tablet Take 1,000 mcg by mouth daily.       Patient Stressors: Loss of Mother passed in September Marital or family conflict Occupational concerns Traumatic event  Patient Strengths: Average or above average intelligence Capable of independent living Communication skills Work skills  Treatment Modalities: Medication Management, Group therapy, Case management,  1 to 1 session with clinician, Psychoeducation, Recreational therapy.   Physician Treatment Plan for Primary Diagnosis: <principal problem not specified> Long Term Goal(s):     Short Term Goals:    Medication Management: Evaluate patient's response, side effects, and tolerance of medication regimen.  Therapeutic Interventions: 1 to 1 sessions, Unit Group sessions and Medication administration.  Evaluation of Outcomes: Not Met  Physician Treatment Plan for Secondary Diagnosis: Active Problems:   MDD (major depressive disorder), single episode, severe ([place])  Long Term Goal(s):     Short Term Goals:       Medication Management: Evaluate patient's response, side effects, and tolerance of medication regimen.  Therapeutic Interventions: 1 to 1 sessions,  Unit Group sessions and Medication administration.  Evaluation of Outcomes: Not Met   RN Treatment Plan for Primary Diagnosis: <principal problem not specified> Long Term Goal(s): Knowledge of disease and therapeutic regimen to maintain health will improve  Short Term Goals: Ability to remain free from injury will improve, Ability to verbalize frustration and anger appropriately will improve, Ability to participate in decision making will improve, Ability to disclose and  discuss suicidal ideas, Ability to identify and develop effective coping behaviors will improve and Compliance with prescribed medications will improve  Medication Management: RN will administer medications as ordered by provider, will assess and evaluate patient's response and provide education to patient for prescribed medication. RN will report any adverse and/or side effects to prescribing provider.  Therapeutic Interventions: 1 on 1 counseling sessions, Psychoeducation, Medication administration, Evaluate responses to treatment, Monitor vital signs and CBGs as ordered, Perform/monitor CIWA, COWS, AIMS and Fall Risk screenings as ordered, Perform wound care treatments as ordered.  Evaluation of Outcomes: Not Met   LCSW Treatment Plan for Primary Diagnosis: <principal problem not specified> Long Term Goal(s): Safe transition to appropriate next level of care at discharge, Engage patient in therapeutic group addressing interpersonal concerns.  Short Term Goals: Engage patient in aftercare planning with referrals and resources, Increase social support, Increase ability to appropriately verbalize feelings, Identify triggers associated with mental health/substance abuse issues and Increase skills for wellness and recovery  Therapeutic Interventions: Assess for all discharge needs, 1 to 1 time with Social worker, Explore available resources and support systems, Assess for adequacy in community support network, Educate family and significant other(s) on suicide prevention, Complete Psychosocial Assessment, Interpersonal group therapy.  Evaluation of Outcomes: Not Met   Progress in Treatment: Attending groups: Yes. Participating in groups: Yes. Taking medication as prescribed: Yes. Toleration medication: Yes. Family/Significant other contact made: No, will contact:  if consent is provided Patient understands diagnosis: Yes. Discussing patient identified problems/goals with staff: Yes. Medical  problems stabilized or resolved: Yes. Denies suicidal/homicidal ideation: No. Issues/concerns per patient self-inventory: No.   New problem(s) identified: No, Describe:  none  New Short Term/Long Term Goal(s): medication stabilization, elimination of SI thoughts, development of comprehensive mental wellness plan.   Patient Goals:  "None. I just want to die"  Discharge Plan or Barriers: Patient recently admitted. CSW will continue to follow and assess for appropriate referrals and possible discharge planning.   Reason for Continuation of Hospitalization: Depression Medication stabilization Suicidal ideation  Estimated Length of Stay: 3-5 days  Attendees: Patient: Evelyn [name] 06/10/2020   Physician: Lala Lund, MD 06/10/2020   Nursing:  06/10/2020   RN Care Manager: 06/10/2020  Social Worker: Darletta Moll, LCSW 06/10/2020   Recreational Therapist:  06/10/2020   Other:  06/10/2020   Other:  06/10/2020   Other: 06/10/2020       Scribe for Treatment Team: Vassie Moselle, LCSW 06/10/2020 11:52 AM

## 2020-06-10 NOTE — Progress Notes (Signed)
Admission Note  Pt is a 59 year old AAF admitted to the services of Dr. Mallie Darting for depression and suicidal ideation related to recent death of mother on 02/23/23.  Pt had taken 8 of an unknown sleep medication and then awoke to take 30 more.  Pt extremely tearful during admission process, did not wish to speak much at this time (it was 2 AM as well).  Pt cooperative with admission process.  Denies alcohol abuse, states that she does use marijuana but denies other drug abuse.  Pt does smoke but does not feel that she will require a nicotine replacement while a patient here.
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
## 2020-06-10 NOTE — ED Notes (Signed)
Safe transport here for pt transfer to [facility], both pt belonging bags were given to driver

## 2020-06-10 NOTE — Tx Team (Signed)
Initial Treatment Plan 06/10/2020 2:47 AM Evelyn [name] MVE:[id number]    PATIENT STRESSORS: Loss of Mother passed in September Marital or family conflict Occupational concerns Traumatic event   PATIENT STRENGTHS: Average or above average intelligence Capable of independent living Communication skills Work skills   PATIENT IDENTIFIED PROBLEMS: Depression  Suicidal Ideation        Pt can not think of goal at this time           DISCHARGE CRITERIA:  Improved stabilization in mood, thinking, and/or behavior Motivation to continue treatment in a less acute [name] of care Need for constant or close observation no longer present Verbal commitment to aftercare and medication compliance  PRELIMINARY DISCHARGE PLAN: Attend aftercare/continuing care group Return to previous living arrangement Return to previous work or school arrangements  PATIENT/FAMILY INVOLVEMENT: This treatment plan has been presented to and reviewed with the patient, Evelyn [name].  The patient and family have been given the opportunity to ask questions and make suggestions.  Margaretann Loveless, RN 06/10/2020, 2:47 AM

## 2020-06-10 NOTE — Progress Notes (Signed)
Patient participated in group, interacted with staff and peers.

## 2020-06-10 NOTE — B[facility] Group Notes (Signed)
LCSW Group Therapy Notes  Type of Therapy and Topic: Group Therapy: Healthy Vs. Unhealthy Coping Strategies  Date and Time: 06/10/20 1:00PM  Participation Level: B[facility] PARTICIPATION LEVEL: Did Not Attend  Description of Group:  In this group, patients will be encouraged to explore their healthy and unhealthy coping strategics. Coping strategies are actions that we take to deal with stress, problems, or uncomfortable emotions in our daily lives. Each patient will be challenged to read some scenarios and discuss the unhealthy and healthy coping strategies within those scenarios. Also, each patient will be challenged to describe current healthy and unhealthy strategies that they use in their own lives and discuss the outcomes and barriers to those strategies. This group will be process-oriented, with patients participating in exploration of their own experiences as well as giving and receiving support and challenge from other group members.  Therapeutic Goals: 1. Patient will identify personal healthy and unhealthy coping strategies. 2. Patient will identify healthy and unhealthy coping strategies, in others, through scenarios.  3. Patient will identify expected outcomes of healthy and unhealthy coping strategies. 4. Patient will identify barriers to using healthy coping strategies.   Summary of Patient Progress: Pt did not attend.    Therapeutic Modalities:  Cognitive Behavioral Therapy Solution Focused Therapy Motivational Interviewing    Darletta Moll MSW, [place] Worker  [facility]

## 2020-06-11 NOTE — Progress Notes (Signed)
Recreation Therapy Notes  Animal-Assisted Activity (AAA) Program Checklist/Progress Notes Patient Eligibility Criteria Checklist & Daily Group note for Rec Tx Intervention  Date: 12.21.21 Time: 1430 Location: [address]   AAA/T Program Assumption of Risk Form signed by Teacher, music or Parent Legal Guardian YES   Patient is free of allergies or severe asthma YES  Patient reports no fear of animals YES  Patient reports no history of cruelty to animals YES   Patient understands his/her participation is voluntary YES   Patient washes hands before animal contact YES  Patient washes hands after animal contact YES  Education: Contractor, Appropriate Animal Interaction   Education Outcome: Acknowledges understanding/In group clarification offered/Needs additional education.   Clinical Observations/Feedback: Pt did not attend group activity.    [name], LRT/CTRS         [name] A 06/11/2020 3:49 PM

## 2020-06-11 NOTE — BHH Suicide Risk Assessment (Signed)
[place] INPATIENT:  Family/Significant Other Suicide Prevention Education  Suicide Prevention Education: Education Completed; Ania Levay [phone] (Husband), has been identified by the patient as the family member/significant other with whom the patient will be residing, and identified as the person(s) who will aid the patient in the event of a mental health crisis (suicidal ideations/suicide attempt).  With written consent from the patient, the family member/significant other has been provided the following suicide prevention education, prior to the and/or following the discharge of the patient.  The suicide prevention education provided includes the following:  Suicide risk factors  Suicide prevention and interventions  National Suicide Hotline telephone number  [facility] assessment telephone number  [facility] Emergency Assistance [place] and/or Residential Mobile Crisis Unit telephone number   Request made of family/significant other to:  Remove weapons (e.g., guns, rifles, knives), all items previously/currently identified as safety concern.    Remove drugs/medications (over-the-counter, prescriptions, illicit drugs), all items previously/currently identified as a safety concern.   The family member/significant other verbalizes understanding of the suicide prevention education information provided.  The family member/significant other agrees to remove the items of safety concern listed above.  CSW spoke with this patients husband who provided limited information regarding patients mental health and stressors. Patients husband stated he "did not know what brought her to hospital. I went to work and she was gone when I got home." He shared that her job might possibly cause her stress. Patients husband seemed uninvolved and to know very little of wife's mental health or anything that has been going on with her and had no questions about her care. Pt's  husband stated that there is no weapons or guns in the home and stated that pt is able to return at discharge.   Darletta Moll MSW, LCSW Clincal Social Worker  [facility]

## 2020-06-11 NOTE — Progress Notes (Signed)
Pt reports that her mood is improving today and pt denied SI/HI/AVH. Pt denies feeling depressed and anxious.  RN administered meds and assessed for needs concerns.  Pt is cooperative and calm this shift.  No adverse reactions to medications noted.

## 2020-06-11 NOTE — B[facility] Group Notes (Signed)
Adult Psychoeducational Group Note  Date:  06/11/2020 Time:  9:24 PM  Group Topic/Focus:  Wrap-Up Group:   The focus of this group is to help patients review their daily goal of treatment and discuss progress on daily workbooks.  Participation Level:  Active  Participation Quality:  Appropriate and Attentive  Affect:  Appropriate  Cognitive:  Alert and Appropriate  Insight: Appropriate and Good  Engagement in Group:  Engaged  Modes of Intervention:  Discussion and Education  Additional Comments:  Pt attended and participated in wrap up group this evening and rated their day an 8/10, due to them wanting more groups, sleeping a lot and being able to let go of things that happened in their life. Pt completed their goal, which was to let things go and to love themselves.   Cristi Loron 06/11/2020, 9:24 PM

## 2020-06-11 NOTE — B[facility] Group Notes (Signed)
Adult Psychoeducational Group Note  Date:  06/11/2020 Time:  4:35 PM  Group Topic/Focus:  Self Care:   The focus of this group is to help patients understand the importance of self-care in order to improve or restore emotional, physical, spiritual, interpersonal, and financial health.  Participation Level:  Did Not Attend  Evelyn [name] 06/11/2020, 4:35 PM

## 2020-06-11 NOTE — BHH Group Notes (Signed)
Adult Psychoeducational Group Note  Date:  06/11/2020 Time:  9:26 AM  Group Topic/Focus:  Goals Group:   The focus of this group is to help patients establish daily goals to achieve during treatment and discuss how the patient can incorporate goal setting into their daily lives to aide in recovery.  Participation Level:  Active  Participation Quality:  Appropriate and Attentive  Affect:  Appropriate  Cognitive:  Alert and Appropriate  Insight: Appropriate and Good  Engagement in Group:  Engaged  Modes of Intervention:  Discussion  Additional Comments:  Pt has a goal of setting up out patient therapy as a resource. Pt wishes to learn how to stay positive about her life and all the things she does. She wants to participate in all group sessions.  Tyrell Antonio Gisela Lea 06/11/2020, 9:26 AM

## 2020-06-11 NOTE — Progress Notes (Signed)
[facility] MD Progress Note  06/11/2020 7:20 AM Evelyn [name]  MRN:  [id number] Subjective:   Evelyn [name] is a 59 yr old female who presents with a suicide attempt (overdose). PPHx is not significant for any issues.  Patient reports that she slept well last night. She reports that her appetite is good. She states that today she has no SI and continues to have no HI or AVH. She reports that she had a lot of benefit from group therapy. She states she learned coping skills. Encouraged continuing to attend to further develop and refine coping skills. She states she thinks personal and family therapy would be good once she discharges.  During our conversation she was tearful. Discussed that she has had to assume many roles over the years and that now her kids have moved out and her mother has died she does not know what to do. Discussed finding new role that chooses for herself. She reports she used to read, crochet, and make her own clothes. Encouraged self reflection today to rediscover her passions.   She has no other concerns at this time.  Principal Problem: MDD (major depressive disorder), single episode, severe ([place]) Diagnosis: Principal Problem:   MDD (major depressive disorder), single episode, severe ([place]) Active Problems:   Suicide attempt by substance overdose ([place])  Total Time spent with patient: 15 minutes  Past Psychiatric History: No reported history, never seen by therapist or psychiatrist  Past Medical History:  Past Medical History:  Diagnosis Date  . Anxiety   . Arthritis   . Blood transfusion without reported diagnosis   . 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 Hyperlipidemia   . Hypertension     Past Surgical History:  Procedure Laterality Date  . CESAREAN SECTION     x2  . FOOT SURGERY Left    [place] Toe  . TUBAL LIGATION     Family History:  Family History  Problem Relation Age of Onset  . Diabetes Mother   . COPD Mother   . Renal Disease Mother    . Heart attack Father 68  . Stroke Brother   . Colon cancer Neg Hx   . Esophageal cancer Neg Hx   . Liver cancer Neg Hx   . Pancreatic cancer Neg Hx   . Rectal cancer Neg Hx   . Stomach cancer Neg Hx    Family Psychiatric  History: None reported Social History:  Social History   Substance and Sexual Activity  Alcohol Use Yes  . Alcohol/week: 2.0 standard drinks  . Types: 2 Cans of beer per week   Comment: Beer 2 daily     Social History   Substance and Sexual Activity  Drug Use Yes  . Types: Marijuana   Comment: Episodic use of marijuana    Social History   Socioeconomic History  . Marital status: Married    Spouse name: Not on file  . Number of children: 2  . Years of education: Not on file  . Highest education level: Not on file  Occupational History  . Occupation: Glass blower/designer  Tobacco Use  . Smoking status: Current Every Day Smoker    Packs/day: 0.25    Years: 22.00    Pack years: 5.50    Types: Cigarettes  . Smokeless tobacco: Never Used  . Tobacco comment: she is down to 2-3 cigarettes per day  Vaping Use  . Vaping Use: Never used  Substance and Sexual Activity  . Alcohol use: Yes  Alcohol/week: 2.0 standard drinks    Types: 2 Cans of beer per week    Comment: Beer 2 daily  . Drug use: Yes    Types: Marijuana    Comment: Episodic use of marijuana  . Sexual activity: Yes  Other Topics Concern  . Not on file  Social History Narrative   Pt lives in [place] with husband, son, nephew, and nephew's girlfriend   Social Determinants of Health   Financial Resource Strain: Not on file  Food Insecurity: Not on file  Transportation Needs: Not on file  Physical Activity: Not on file  Stress: Not on file  Social Connections: Not on file   Additional Social History:                         Sleep: Fair  Appetite:  Fair  Current Medications: Current Facility-Administered Medications  Medication Dose Route Frequency Provider Last  Rate Last Admin  . acetaminophen (TYLENOL) tablet 650 mg  650 mg Oral Q6H PRN Nwoko, Uchenna E, PA      . alum & mag hydroxide-simeth (MAALOX/MYLANTA) 200-200-20 MG/5ML suspension 30 mL  30 mL Oral Q4H PRN Nwoko, Uchenna E, PA      . amLODipine (NORVASC) tablet 10 mg  10 mg Oral Daily Briant Cedar, MD   10 mg at 06/10/20 1744  . aspirin chewable tablet 81 mg  81 mg Oral Daily Briant Cedar, MD   81 mg at 06/10/20 1744  . cycloSPORINE (RESTASIS) 0.05 % ophthalmic emulsion 1 drop  1 drop Both Eyes BID Sharma Covert, MD   1 drop at 06/10/20 1745  . escitalopram (LEXAPRO) tablet 10 mg  10 mg Oral Daily Briant Cedar, MD   10 mg at 06/10/20 1746  . hydrochlorothiazide (HYDRODIURIL) tablet 25 mg  25 mg Oral Daily Briant Cedar, MD      . hydrOXYzine (ATARAX/VISTARIL) tablet 25 mg  25 mg Oral TID PRN Nwoko, Uchenna E, PA      . losartan (COZAAR) tablet 100 mg  100 mg Oral Daily Latise Dilley, Redgie Grayer, MD      . magnesium hydroxide (MILK OF MAGNESIA) suspension 30 mL  30 mL Oral Daily PRN Nwoko, Uchenna E, PA      . rosuvastatin (CRESTOR) tablet 10 mg  10 mg Oral Daily Briant Cedar, MD   10 mg at 06/10/20 1749  . spironolactone (ALDACTONE) tablet 25 mg  25 mg Oral Daily Kieth Hartis, Redgie Grayer, MD      . traZODone (DESYREL) tablet 50 mg  50 mg Oral QHS PRN Nwoko, Uchenna E, PA   50 mg at 06/10/20 2149  . vitamin B-12 (CYANOCOBALAMIN) tablet 1,000 mcg  1,000 mcg Oral Daily Vassie Kugel, Redgie Grayer, MD      . Vitamin D3 (Vitamin D) tablet 1,000 Units  1,000 Units Oral Daily Kai Levins Redgie Grayer, MD   1,000 Units at 06/10/20 1750    Lab Results:  Results for orders placed or performed during the hospital encounter of 06/10/20 (from the past 48 hour(s))  TSH     Status: None   Collection Time: 06/10/20  6:04 PM  Result Value Ref Range   TSH 0.948 0.350 - 4.500 uIU/mL    Comment: Performed by a 3rd Generation assay with a functional sensitivity of <=0.01  uIU/mL. Performed at [facility], [place] [address], [place], [place] [address]   Lipid panel     Status: Abnormal  Collection Time: 06/10/20  6:04 PM  Result Value Ref Range   Cholesterol 218 (H) 0 - 200 mg/dL   Triglycerides 249 (H) <150 mg/dL   HDL 57 >40 mg/dL   Total CHOL/HDL Ratio 3.8 RATIO   VLDL 50 (H) 0 - 40 mg/dL   LDL Cholesterol 111 (H) 0 - 99 mg/dL    Comment:        Total Cholesterol/HDL:CHD Risk Coronary Heart Disease Risk Table                     Men   Women  1/2 Average Risk   3.4   3.3  Average Risk       5.0   4.4  2 X Average Risk   9.6   7.1  3 X Average Risk  23.4   11.0        Use the calculated Patient Ratio above and the CHD Risk Table to determine the patient's CHD Risk.        ATP III CLASSIFICATION (LDL):  <100     mg/dL   Optimal  100-129  mg/dL   Near or Above                    Optimal  130-159  mg/dL   Borderline  160-189  mg/dL   High  >190     mg/dL   Very High Performed at [facility] [address] [place], [place] [address]   Hemoglobin A1c     Status: Abnormal   Collection Time: 06/10/20  6:04 PM  Result Value Ref Range   Hgb A1c MFr Bld 6.5 (H) 4.8 - 5.6 %    Comment: REPEATED TO VERIFY (NOTE) Pre diabetes:          5.7%-6.4%  Diabetes:              >6.4%  Glycemic control for   <7.0% adults with diabetes    Mean Plasma Glucose 139.85 mg/dL    Comment: Performed at [facility] [address] [place], [place] [address]    Blood Alcohol level:  Lab Results  Component Value Date   ETH <10 77/93/9030    Metabolic Disorder Labs: Lab Results  Component Value Date   HGBA1C 6.5 (H) 06/10/2020   MPG 139.85 06/10/2020   No results found for: PROLACTIN Lab Results  Component Value Date   CHOL 218 (H) 06/10/2020   TRIG 249 (H) 06/10/2020   HDL 57 06/10/2020   CHOLHDL 3.8 06/10/2020   VLDL 50 (H) 06/10/2020   LDLCALC 111 (H) 06/10/2020   LDLCALC 76 01/04/2020     Physical Findings: AIMS: Facial and Oral Movements Muscles of Facial Expression: None, normal Lips and Perioral Area: None, normal Jaw: None, normal Tongue: None, normal,Extremity Movements Upper (arms, wrists, hands, fingers): None, normal Lower (legs, knees, ankles, toes): None, normal, Trunk Movements Neck, shoulders, hips: None, normal, Overall Severity Severity of abnormal movements (highest score from questions above): None, normal Incapacitation due to abnormal movements: None, normal Patient's awareness of abnormal movements (rate only patient's report): No Awareness, Dental Status Current problems with teeth and/or dentures?: No Does patient usually wear dentures?: No  CIWA:  CIWA-Ar Total: 4 COWS:     Musculoskeletal: Strength & Muscle Tone: within normal limits Gait & Station: normal Patient leans: N/A  Psychiatric Specialty Exam: Physical Exam  Review of Systems  Constitutional: Negative for fatigue and fever.  Respiratory: Negative  for chest tightness and shortness of breath.   Cardiovascular: Negative for chest pain and palpitations.  Gastrointestinal: Negative for abdominal pain, constipation, diarrhea, nausea and vomiting.  Neurological: Negative for dizziness, weakness, light-headedness and headaches.  Psychiatric/Behavioral: Negative for agitation, self-injury, sleep disturbance and suicidal ideas.    Blood pressure 114/74, pulse (!) 102, temperature 98.4 F (36.9 C), temperature source Oral, resp. rate 16, height 5\' 3"  (1.6 m), weight 69.4 kg, last menstrual period 07/16/2017, SpO2 100 %.Body mass index is 27.1 kg/m.  General Appearance: Casual  Eye Contact:  Fair  Speech:  Clear and Coherent and Normal Rate  Volume:  Decreased  Mood:  Depressed  Affect:  Depressed and Tearful  Thought Process:  Coherent  Orientation:  Full (Time, Place, and Person)  Thought Content:  Logical  Suicidal Thoughts:  No  Homicidal Thoughts:  No  Memory:  Immediate;    Fair Recent;   Fair  Judgement:  Fair  Insight:  Fair  Psychomotor Activity:  Normal  Concentration:  Concentration: Good and Attention Span: Good  Recall:  Good  Fund of Knowledge:  Good  Language:  Good  Akathisia:  Negative  Handed:  Right  AIMS (if indicated):     Assets:  Desire for Improvement Housing Resilience  ADL's:  Intact  Cognition:  WNL  Sleep:  Number of Hours: 1.5     Treatment Plan Summary: Daily contact with patient to assess and evaluate symptoms and progress in treatment She reports some improvement. Will continue with current dosage and has had no side effects from starting Lexapro. She reported getting benefit from group therapy and encouraged continued attendance.   -Continue Lexapro 10 mg -Continue Cozaar 100 mg, HCTZ 25 mg -Continue Amlodipine 10 mg daily -Continue Spironolactone 25 mg daily  -Continue Aspirin 81 mg daily -Continue Rosuvastatin 10 mg daily -Continue B-12 daily -Continue Restasis 1 drop both eyes BID -Continue Vit D3 25 mcg daily -Continue PRN's: Tylenol, Maalox, Atarax, Trazodone  Briant Cedar, MD 06/11/2020, 7:20 AM

## 2020-06-12 MED ORDER — ESCITALOPRAM OXALATE 20 MG PO TABS
20.0000 mg | ORAL_TABLET | Freq: Every day | ORAL | Status: DC
  Administered 2020-06-13: 20 mg via ORAL
  Filled 2020-06-12 (×2): qty 1

## 2020-06-12 NOTE — BHH Group Notes (Signed)
[facility] LCSW Group Therapy  06/12/2020 1:45 PM  Type of Therapy:  Group Therapy  Participation Level:  Did Not Attend    Evelyn [name] 06/12/2020, 1:45 PM

## 2020-06-12 NOTE — Progress Notes (Signed)
[facility] MD Progress Note  06/12/2020 7:17 AM AUDRIANA SIGNOR  MRN:  [id number] Subjective:   Mrs. Rase is a 59 yr old female who presents with a suicide attempt (overdose). PPHx is not significant for any issues.  She reports that she is doing well. She states that yesterday she took a long a nap and was feeling very rejuvenated. She reports no issues with the medications. She reports sleeping well and that her appetite is good. She reports no SI, HI, or AVH. She has no other concerns at the present.  She reports that she has already been using her coping skills learned during group therapy. Specifically this morning she was working on allowing herself to cry and then let go over her sadness. Encouraged continuing to practice this and continue attending group therapy to further develop and refine coping skills. Tasked her with finding what makes her happy and how going forward what role she wants to take on. She discussed how she likes driving and again brought up her sowing and crocheting.   Principal Problem: MDD (major depressive disorder), single episode, severe ([place]) Diagnosis: Principal Problem:   MDD (major depressive disorder), single episode, severe ([place]) Active Problems:   Suicide attempt by substance overdose ([place])  Total Time spent with patient: 15 minutes  Past Psychiatric History: No reported history, never seen by therapist or psychiatrist  Past Medical History:  Past Medical History:  Diagnosis Date  . Anxiety   . Arthritis   . Blood transfusion without reported diagnosis   . 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 Hyperlipidemia   . Hypertension     Past Surgical History:  Procedure Laterality Date  . CESAREAN SECTION     x2  . FOOT SURGERY Left    [place]amm[place] Toe  . TUBAL LIGATION     Family History:  Family History  Problem Relation Age of Onset  . Diabetes Mother   . COPD Mother   . Renal Disease Mother   . Heart attack Father 31  . Stroke Brother    . Colon cancer Neg Hx   . Esophageal cancer Neg Hx   . Liver cancer Neg Hx   . Pancreatic cancer Neg Hx   . Rectal cancer Neg Hx   . Stomach cancer Neg Hx    Family Psychiatric  History: None reported Social History:  Social History   Substance and Sexual Activity  Alcohol Use Yes  . Alcohol/week: 2.0 standard drinks  . Types: 2 Cans of beer per week   Comment: Beer 2 daily     Social History   Substance and Sexual Activity  Drug Use Yes  . Types: Marijuana   Comment: Episodic use of marijuana    Social History   Socioeconomic History  . Marital status: Married    Spouse name: Not on file  . Number of children: 2  . Years of education: Not on file  . Highest education level: Not on file  Occupational History  . Occupation: Glass blower/designer  Tobacco Use  . Smoking status: Current Every Day Smoker    Packs/day: 0.25    Years: 22.00    Pack years: 5.50    Types: Cigarettes  . Smokeless tobacco: Never Used  . Tobacco comment: she is down to 2-3 cigarettes per day  Vaping Use  . Vaping Use: Never used  Substance and Sexual Activity  . Alcohol use: Yes    Alcohol/week: 2.0 standard drinks    Types: 2  Cans of beer per week    Comment: Beer 2 daily  . Drug use: Yes    Types: Marijuana    Comment: Episodic use of marijuana  . Sexual activity: Yes  Other Topics Concern  . Not on file  Social History Narrative   Pt lives in [place] with husband, son, nephew, and nephew's girlfriend   Social Determinants of Health   Financial Resource Strain: Not on file  Food Insecurity: Not on file  Transportation Needs: Not on file  Physical Activity: Not on file  Stress: Not on file  Social Connections: Not on file   Additional Social History:                         Sleep: Good  Appetite:  Good  Current Medications: Current Facility-Administered Medications  Medication Dose Route Frequency Provider Last Rate Last Admin  . acetaminophen (TYLENOL)  tablet 650 mg  650 mg Oral Q6H PRN Nwoko, Uchenna E, PA      . alum & mag hydroxide-simeth (MAALOX/MYLANTA) 200-200-20 MG/5ML suspension 30 mL  30 mL Oral Q4H PRN Nwoko, Uchenna E, PA      . amLODipine (NORVASC) tablet 10 mg  10 mg Oral Daily Briant Cedar, MD   10 mg at 06/11/20 0818  . aspirin chewable tablet 81 mg  81 mg Oral Daily Briant Cedar, MD   81 mg at 06/11/20 0818  . cycloSPORINE (RESTASIS) 0.05 % ophthalmic emulsion 1 drop  1 drop Both Eyes BID Sharma Covert, MD   1 drop at 06/11/20 1742  . escitalopram (LEXAPRO) tablet 10 mg  10 mg Oral Daily Briant Cedar, MD   10 mg at 06/11/20 0818  . hydrochlorothiazide (HYDRODIURIL) tablet 25 mg  25 mg Oral Daily Briant Cedar, MD   25 mg at 06/11/20 7425  . hydrOXYzine (ATARAX/VISTARIL) tablet 25 mg  25 mg Oral TID PRN Ileene Musa E, PA   25 mg at 06/11/20 2156  . losartan (COZAAR) tablet 100 mg  100 mg Oral Daily Briant Cedar, MD   100 mg at 06/11/20 0818  . magnesium hydroxide (MILK OF MAGNESIA) suspension 30 mL  30 mL Oral Daily PRN Nwoko, Uchenna E, PA      . rosuvastatin (CRESTOR) tablet 10 mg  10 mg Oral Daily Briant Cedar, MD   10 mg at 06/11/20 0818  . spironolactone (ALDACTONE) tablet 25 mg  25 mg Oral Daily Briant Cedar, MD   25 mg at 06/11/20 0818  . traZODone (DESYREL) tablet 50 mg  50 mg Oral QHS PRN Nwoko, Uchenna E, PA   50 mg at 06/10/20 2149  . vitamin B-12 (CYANOCOBALAMIN) tablet 1,000 mcg  1,000 mcg Oral Daily Briant Cedar, MD   1,000 mcg at 06/11/20 0818  . Vitamin D3 (Vitamin D) tablet 1,000 Units  1,000 Units Oral Daily Briant Cedar, MD   1,000 Units at 06/11/20 9563    Lab Results:  Results for orders placed or performed during the hospital encounter of 06/10/20 (from the past 48 hour(s))  TSH     Status: None   Collection Time: 06/10/20  6:04 PM  Result Value Ref Range   TSH 0.948 0.350 - 4.500 uIU/mL    Comment: Performed by a 3rd  Generation assay with a functional sensitivity of <=0.01 uIU/mL. Performed at [facility], [place] [address] [place], [place] [address]   Lipid panel  Status: Abnormal   Collection Time: 06/10/20  6:04 PM  Result Value Ref Range   Cholesterol 218 (H) 0 - 200 mg/dL   Triglycerides 249 (H) <150 mg/dL   HDL 57 >40 mg/dL   Total CHOL/HDL Ratio 3.8 RATIO   VLDL 50 (H) 0 - 40 mg/dL   LDL Cholesterol 111 (H) 0 - 99 mg/dL    Comment:        Total Cholesterol/HDL:CHD Risk Coronary Heart Disease Risk Table                     Men   Women  1/2 Average Risk   3.4   3.3  Average Risk       5.0   4.4  2 X Average Risk   9.6   7.1  3 X Average Risk  23.4   11.0        Use the calculated Patient Ratio above and the CHD Risk Table to determine the patient's CHD Risk.        ATP III CLASSIFICATION (LDL):  <100     mg/dL   Optimal  100-129  mg/dL   Near or Above                    Optimal  130-159  mg/dL   Borderline  160-189  mg/dL   High  >190     mg/dL   Very High Performed at [facility] [address] [place], [place] [address]   Hemoglobin A1c     Status: Abnormal   Collection Time: 06/10/20  6:04 PM  Result Value Ref Range   Hgb A1c MFr Bld 6.5 (H) 4.8 - 5.6 %    Comment: REPEATED TO VERIFY (NOTE) Pre diabetes:          5.7%-6.4%  Diabetes:              >6.4%  Glycemic control for   <7.0% adults with diabetes    Mean Plasma Glucose 139.85 mg/dL    Comment: Performed at [facility] [address], [place], [place] [address]    Blood Alcohol level:  Lab Results  Component Value Date   ETH <10 0000000    Metabolic Disorder Labs: Lab Results  Component Value Date   HGBA1C 6.5 (H) 06/10/2020   MPG 139.85 06/10/2020   No results found for: PROLACTIN Lab Results  Component Value Date   CHOL 218 (H) 06/10/2020   TRIG 249 (H) 06/10/2020   HDL 57 06/10/2020   CHOLHDL 3.8 06/10/2020   VLDL 50 (H) 06/10/2020    LDLCALC 111 (H) 06/10/2020   LDLCALC 76 01/04/2020    Physical Findings: AIMS: Facial and Oral Movements Muscles of Facial Expression: None, normal Lips and Perioral Area: None, normal Jaw: None, normal Tongue: None, normal,Extremity Movements Upper (arms, wrists, hands, fingers): None, normal Lower (legs, knees, ankles, toes): None, normal, Trunk Movements Neck, shoulders, hips: None, normal, Overall Severity Severity of abnormal movements (highest score from questions above): None, normal Incapacitation due to abnormal movements: None, normal Patient's awareness of abnormal movements (rate only patient's report): No Awareness, Dental Status Current problems with teeth and/or dentures?: No Does patient usually wear dentures?: No  CIWA:  CIWA-Ar Total: 4 COWS:     Musculoskeletal: Strength & Muscle Tone: within normal limits Gait & Station: normal Patient leans: N/A  Psychiatric Specialty Exam: Physical Exam Vitals and nursing note reviewed.  Constitutional:  General: She is not in acute distress.    Appearance: Normal appearance. She is normal weight. She is not ill-appearing, toxic-appearing or diaphoretic.  HENT:     Head: Normocephalic and atraumatic.  Cardiovascular:     Rate and Rhythm: Normal rate.  Pulmonary:     Effort: Pulmonary effort is normal.  Musculoskeletal:        General: Normal range of motion.  Neurological:     General: No focal deficit present.     Mental Status: She is alert.     Review of Systems  Constitutional: Negative for fatigue and fever.  Respiratory: Negative for chest tightness and shortness of breath.   Cardiovascular: Negative for chest pain and palpitations.  Gastrointestinal: Negative for abdominal pain, constipation, diarrhea, nausea and vomiting.  Neurological: Negative for dizziness, weakness, light-headedness and headaches.  Psychiatric/Behavioral: Negative for self-injury, sleep disturbance and suicidal ideas.    Blood  pressure 109/75, pulse 89, temperature 99.1 F (37.3 C), temperature source Oral, resp. rate 16, height 5\' 3"  (1.6 m), weight 69.4 kg, last menstrual period 07/16/2017, SpO2 100 %.Body mass index is 27.1 kg/m.  General Appearance: Fairly Groomed  Eye Contact:  Good  Speech:  Clear and Coherent and Normal Rate  Volume:  Normal  Mood:  Appropriate  Affect:  Appropriate  Thought Process:  Coherent and Goal Directed  Orientation:  Full (Time, Place, and Person)  Thought Content:  Logical  Suicidal Thoughts:  No  Homicidal Thoughts:  No  Memory:  Immediate;   Good Recent;   Good  Judgement:  Good  Insight:  Fair  Psychomotor Activity:  Normal  Concentration:  Concentration: Good and Attention Span: Good  Recall:  Good  Fund of Knowledge:  Good  Language:  Good  Akathisia:  Negative  Handed:  Right  AIMS (if indicated):     Assets:  Desire for Improvement Resilience  ADL's:  Intact  Cognition:  WNL  Sleep:  Number of Hours: 5.75     Treatment Plan Summary: Daily contact with patient to assess and evaluate symptoms and progress in treatment She has done well. Will increase Lexapro today. Will monitor over night. Will likely discharge tomorrow. Social Work is scheduling individual and family therapy.   -Increase Lexapro to 20 mg daily -Continue Cozaar 100 mg, HCTZ 25 mg -Continue Amlodipine 10 mg daily -Continue Spironolactone 25 mg daily  -Continue Aspirin 81 mg daily -Continue Rosuvastatin 10 mg daily -Continue B-12 daily -Continue Restasis 1 drop both eyes BID -Continue Vit D3 25 mcg daily -Continue PRN's: Tylenol, Maalox, Atarax, Trazodone   Briant Cedar, MD 06/12/2020, 7:17 AM

## 2020-06-12 NOTE — Progress Notes (Addendum)
   06/12/20 0016  Psych Admission Type (Psych Patients Only)  Admission Status Voluntary  Psychosocial Assessment  Patient Complaints None  Eye Contact Brief  Facial Expression Sad  Affect Depressed  Speech Logical/coherent  Interaction Assertive  Motor Activity Slow  Appearance/Hygiene Unremarkable;In scrubs  Behavior Characteristics Cooperative  Mood Pleasant  Thought Process  Coherency WDL  Content WDL  Delusions None reported or observed  Perception WDL  Hallucination None reported or observed  Judgment WDL  Confusion None  Danger to Self  Current suicidal ideation? Denies  Danger to Others  Danger to Others None reported or observed   Pt seen interacting with other patients. Pt denies SI, HI, AVH and pain.

## 2020-06-12 NOTE — Progress Notes (Signed)
Adult Psychoeducational Group Note  Date:  06/12/2020 Time:  9:07 PM  Group Topic/Focus:  Wrap-Up Group:   The focus of this group is to help patients review their daily goal of treatment and discuss progress on daily workbooks.  Participation Level:  Minimal  Participation Quality:  Appropriate  Affect:  Appropriate  Cognitive:  Oriented  Insight: Appropriate  Engagement in Group:  Engaged  Modes of Intervention:  Education and Support  Additional Comments:  Patient attended and participated in group tonight. She reports that today was a good day dor her.  Salley Scarlet [facility] 06/12/2020, 9:07 PM

## 2020-06-12 NOTE — Progress Notes (Signed)
°   06/12/20 1300  Psych Admission Type (Psych Patients Only)  Admission Status Voluntary  Psychosocial Assessment  Patient Complaints None  Eye Contact Fair  Facial Expression Flat  Affect Anxious;Depressed  Speech Logical/coherent  Interaction Assertive  Motor Activity Slow  Appearance/Hygiene Unremarkable  Behavior Characteristics Appropriate to situation;Cooperative  Mood Anxious  Thought Process  Coherency WDL  Content WDL  Delusions None reported or observed  Perception WDL  Hallucination None reported or observed  Judgment WDL  Confusion None  Danger to Self  Current suicidal ideation? Denies  Self-Injurious Behavior No self-injurious ideation or behavior indicators observed or expressed   Danger to Others  Danger to Others None reported or observed    Orders reviewed with pt. Verbal support provided. Vital signs reviewed. Patient encouraged to attend groups. 15 minute checks performed for safety.   Patient compliant with taking medications and denies any side effects.

## 2020-06-12 NOTE — [facility] Group Notes (Signed)
Patient did not attend Recreational Therapy group because she was in consultation.

## 2020-06-12 NOTE — Progress Notes (Signed)
Recreation Therapy Notes  Date:  12.22.21 Time: 0930 Location: [address] Group Room  Group Topic: Stress Management  Goal Area(s) Addresses:  Patient will identify positive stress management techniques. Patient will identify benefits of using stress management post d/c.  Behavioral Response: Engaged  Intervention: Stress Management  Activity:  Guided Imagery.  LRT read a script that focused on envisioning your peaceful place.  Patients were to imagine being in the setting that gives them the most peace and offers the most calm and relaxation.    Education:  Stress Management, Discharge Planning.   Education Outcome: Acknowledges Education  Clinical Observations/Feedback: Pt attended and participated in activity.    [name], LRT/CTRS        Ria Comment, Galileah Piggee A 06/12/2020 11:40 AM

## 2020-06-13 MED ORDER — ESCITALOPRAM OXALATE 20 MG PO TABS: 20.0000 mg | ORAL_TABLET | Freq: Every day | ORAL | 0 refills | Status: DC

## 2020-06-13 MED ORDER — CYCLOSPORINE 0.05 % OP EMUL: 1.0000 [drp] | Freq: Two times a day (BID) | OPHTHALMIC | 0 refills | Status: DC

## 2020-06-13 NOTE — Progress Notes (Signed)
RN met with pt and reviewed pt's discharge instructions.  Pt verbalized understanding of discharge instructions and pt did not have any questions. RN reviewed and provided pt with a copy of SRA, AVS and Transition Record.  RN returned pt's belongings to pt.  Pt denied SI/HI/AVH and voiced no concerns.  Pt was appreciative of the care pt received at [facility].  Patient discharged to the lobby without incident. 

## 2020-06-13 NOTE — Progress Notes (Signed)
   06/13/20 0203  Psych Admission Type (Psych Patients Only)  Admission Status Voluntary  Psychosocial Assessment  Patient Complaints None  Eye Contact Fair  Facial Expression Flat  Affect Anxious;Depressed  Speech Logical/coherent  Interaction Assertive  Motor Activity Slow  Appearance/Hygiene Unremarkable  Behavior Characteristics Cooperative;Calm  Mood Pleasant  Thought Process  Coherency WDL  Content WDL  Delusions None reported or observed  Perception WDL  Hallucination None reported or observed  Judgment WDL  Confusion None  Danger to Self  Current suicidal ideation? Denies  Self-Injurious Behavior No self-injurious ideation or behavior indicators observed or expressed   Agreement Not to Harm Self Yes  Description of Agreement Verbal Contract  Danger to Others  Danger to Others None reported or observed

## 2020-06-13 NOTE — [facility] Suicide Risk Assessment (Signed)
[facility] Discharge Suicide Risk Assessment   Principal Problem: MDD (major depressive disorder), single episode, severe ([place]) Discharge Diagnoses: Principal Problem:   MDD (major depressive disorder), single episode, severe ([place]) Active Problems:   Suicide attempt by substance overdose ([place])   Total Time spent with patient: 20 minutes  Musculoskeletal: Strength & Muscle Tone: within normal limits Gait & Station: normal Patient leans: N/A  Psychiatric Specialty Exam: Review of Systems  Blood pressure 129/79, pulse 88, temperature 98.8 F (37.1 C), temperature source Oral, resp. rate 16, height 5\' 3"  (1.6 m), weight 69.4 kg, last menstrual period 07/16/2017, SpO2 100 %.Body mass index is 27.1 kg/m.  General Appearance: Casual  Eye Contact::  Fair  Speech:  Clear and [id number]  Volume:  Normal  Mood:  Euthymic  Affect:  Appropriate  Thought Process:  Coherent  Orientation:  Full (Time, Place, and Person)  Thought Content:  Logical  Suicidal Thoughts:  No  Homicidal Thoughts:  No  Memory:  Recent;   Fair  Judgement:  Fair  Insight:  Fair  Psychomotor Activity:  Normal  Concentration:  Fair  Recall:  [employer] [facility] [place]  Language: Fair  Akathisia:  No  Handed:  Right  AIMS (if indicated):     Assets:  Communication Skills Desire for Improvement Housing Intimacy Leisure Time Physical Health Resilience  Sleep:  Number of Hours: 6.75  Cognition: WNL  ADL's:  Intact   Mental Status Per Nursing Assessment::   On Admission:  Suicidal ideation indicated by patient,Suicidal ideation indicated by others,Self-harm behaviors,Suicide plan  Demographic Factors:  Low socioeconomic status  Loss Factors: Loss of significant relationship  Historical Factors: Impulsivity  Risk Reduction Factors:   Sense of responsibility to family, Living with another person, especially a relative, Positive social support, Positive therapeutic relationship and Positive coping skills  or problem solving skills  Continued Clinical Symptoms:  Previous Psychiatric Diagnoses and Treatments  Cognitive Features That Contribute To Risk:  Closed-mindedness    Suicide Risk:  Mild:  Suicidal ideation of limited frequency, intensity, duration, and specificity.  There are no identifiable plans, no associated intent, mild dysphoria and related symptoms, good self-control (both objective and subjective assessment), few other risk factors, and identifiable protective factors, including available and accessible social support.   Follow-up Information    Vincent Peyer Burchette,LCAS,CRC,MS,MBA. Go on 06/17/2020.   Why: You have an appointment for therapy and medication management on 06/17/20 at 2:00 pm.  This appointment will be held in person. Contact information: [address], [place] [address]  P:  [phone]              Plan Of Care/Follow-up recommendations:  Other:  Follow-up with outpatient care  Dixie Dials, MD 06/13/2020, 9:24 AM

## 2020-06-13 NOTE — [facility] Group Notes (Signed)
Patient was made aware of Orientation and Goal Setting group and chose not to attend.

## 2020-06-13 NOTE — Progress Notes (Signed)
°  [facility] Adult Case Management Discharge Plan :  Will you be returning to the same living situation after discharge:  Yes,  personal residence At discharge, do you have transportation home?: Yes,  personal car Do you have the ability to pay for your medications: Yes,  has private insurance and employment  Release of information consent forms completed and in the chart;  Patient's signature needed at discharge.  Patient to Follow up at:  Follow-up Information    Vincent Peyer Burchette,LCAS,CRC,MS,MBA. Go on 06/17/2020.   Why: You have an appointment for therapy and medication management on 06/17/20 at 2:00 pm.  This appointment will be held in person. Contact information: [address], [place] [address]  P:  [phone]              Next level of care provider has access to [place] and Suicide Prevention discussed: Velta Addison  Margeart Allender [phone] (Husband)   Have you used any form of tobacco in the last 30 days? (Cigarettes, Smokeless Tobacco, Cigars, and/or Pipes): Yes  Has patient been referred to the Quitline?: Patient refused referral  Patient has been referred for addiction treatment: N/A  Mliss Fritz, [place] 06/13/2020, 9:25 AM
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
## 2020-06-17 ENCOUNTER — Other Ambulatory Visit: Payer: Self-pay | Admitting: Cardiology

## 2020-06-17 DIAGNOSIS — I10 Essential (primary) hypertension: Secondary | ICD-10-CM

## 2020-06-18 ENCOUNTER — Ambulatory Visit (INDEPENDENT_AMBULATORY_CARE_PROVIDER_SITE_OTHER): Payer: BC Managed Care – PPO | Admitting: Nurse Practitioner

## 2020-06-18 ENCOUNTER — Other Ambulatory Visit: Payer: Self-pay

## 2020-06-18 ENCOUNTER — Encounter: Payer: Self-pay | Admitting: Nurse Practitioner

## 2020-06-18 VITALS — BP 112/62 | HR 84 | Temp 98.9°F | Wt 157.2 lb

## 2020-06-18 DIAGNOSIS — M25511 Pain in right shoulder: Secondary | ICD-10-CM | POA: Diagnosis not present

## 2020-06-18 DIAGNOSIS — Z2821 Immunization not carried out because of patient refusal: Secondary | ICD-10-CM | POA: Diagnosis not present

## 2020-06-18 DIAGNOSIS — G8929 Other chronic pain: Secondary | ICD-10-CM | POA: Diagnosis not present

## 2020-06-18 DIAGNOSIS — M25512 Pain in left shoulder: Secondary | ICD-10-CM | POA: Diagnosis not present

## 2020-06-18 MED ORDER — KETOROLAC TROMETHAMINE 30 MG/ML IJ SOLN: 30.0000 mg | Freq: Once | INTRAMUSCULAR | Status: DC

## 2020-06-18 NOTE — Progress Notes (Signed)
I,[name],acting as a Education administrator for [employer], FNP.,have documented all relevant documentation on the behalf of [name], FNP,as directed by  [name], FNP while in the presence of [name], [place].  This visit occurred during the SARS-CoV-2 public health emergency.  Safety protocols were in place, including screening questions prior to the visit, additional usage of staff PPE, and extensive cleaning of exam room while observing appropriate contact time as indicated for disinfecting solutions.  Subjective:     Patient ID: Evelyn [name] , female    DOB: December 13, 1960 , 59 y.o.   MRN: [id number]   Chief Complaint  Patient presents with  . Shoulder Pain    HPI  Patient is here for shoulder pain. She would like to be referred to a specialist. Sometimes she has to push her arm up to lift the yarn at work.  She is right handed. She does do repetitive.   Her mother passed away in March 22, 2020.  She has talked with a therapist on Saturday. She has also been seen at [facility] - Dr. Elberta Fortis gave her a medication but she is unsure of the name  Shoulder Pain  The pain is present in the right shoulder and left shoulder (she did have a fall one night on her shoulder and has been hurting since. ). This is a chronic problem. The current episode started more than 1 month ago 22-Mar-2020). The problem occurs intermittently. The quality of the pain is described as sharp. The pain is at a severity of 8/10. Pertinent negatives include no limited range of motion. Numbness: upper arms. Associated symptoms comments: Occurs mostly at work.  . The symptoms are aggravated by standing and activity (mostly at work). She has tried acetaminophen (she was taking 3 Tylenol 325mg ) for the symptoms. The treatment provided significant relief. There is no history of diabetes.     Past Medical History:  Diagnosis Date  . Anxiety   . Arthritis   . Blood transfusion without reported diagnosis   .  Calcification of abdominal aorta ([place]) 11/11/2017  . Hyperlipidemia   . Hypertension      Family History  Problem Relation Age of Onset  . Diabetes Mother   . COPD Mother   . Renal Disease Mother   . Heart attack Father 21  . Stroke Brother   . Colon cancer Neg Hx   . Esophageal cancer Neg Hx   . Liver cancer Neg Hx   . Pancreatic cancer Neg Hx   . Rectal cancer Neg Hx   . Stomach cancer Neg Hx      Current Outpatient Medications:  .  amLODipine (NORVASC) 10 MG tablet, TAKE 1 TABLET BY MOUTH EVERY DAY, Disp: 90 tablet, Rfl: 2 .  aspirin 81 MG chewable tablet, Chew 81 mg by mouth daily., Disp: , Rfl:  .  cholecalciferol (VITAMIN D3) 25 MCG (1000 UT) tablet, Take 1,000 Units by mouth daily., Disp: , Rfl:  .  cycloSPORINE (RESTASIS) 0.05 % ophthalmic emulsion, Place 1 drop into both eyes 2 (two) times daily., Disp: 0.4 mL, Rfl: 0 .  diclofenac Sodium (VOLTAREN) 1 % GEL, Apply 2 g topically 4 (four) times daily. (Patient taking differently: Apply 2 g topically 4 (four) times daily as needed (pain).), Disp: 100 g, Rfl: 2 .  escitalopram (LEXAPRO) 20 MG tablet, Take 1 tablet (20 mg total) by mouth daily., Disp: 30 tablet, Rfl: 0 .  losartan-hydrochlorothiazide (HYZAAR) 100-25 MG tablet, TAKE 1 TABLET BY  MOUTH EVERY DAY (Patient taking differently: Take 1 tablet by mouth daily.), Disp: 90 tablet, Rfl: 1 .  Multiple Vitamins-Minerals (MULTIVITAMIN WOMENS 50+ ADV PO), Take 1 tablet by mouth daily., Disp: , Rfl:  .  rosuvastatin (CRESTOR) 10 MG tablet, TAKE 1 TABLET BY MOUTH EVERY DAY (Patient taking differently: Take 10 mg by mouth daily.), Disp: 90 tablet, Rfl: 1 .  spironolactone (ALDACTONE) 25 MG tablet, Take 1 tablet (25 mg total) by mouth daily., Disp: 90 tablet, Rfl: 3 .  vitamin B-12 (CYANOCOBALAMIN) 1000 MCG tablet, Take 1,000 mcg by mouth daily., Disp: , Rfl:    Allergies  Allergen Reactions  . Lisinopril Other (See Comments)    Intolerance cough      Review of Systems   Constitutional: Negative.   Respiratory: Negative.   Cardiovascular: Negative.   Gastrointestinal: Negative.   Musculoskeletal: Positive for arthralgias.  Neurological: Negative.  Numbness: upper arms.     Today's Vitals   06/18/20 1549  BP: 112/62  Pulse: 84  Temp: 98.9 F (37.2 C)  TempSrc: Oral  Weight: 157 lb 3.2 oz (71.3 kg)   Body mass index is 27.85 kg/m.   Objective:  Physical Exam Constitutional:      General: She is not in acute distress.    Appearance: Normal appearance.  Cardiovascular:     Rate and Rhythm: Normal rate and regular rhythm.     Pulses: Normal pulses.     Heart sounds: Normal heart sounds. No murmur heard.   Neurological:     General: No focal deficit present.     Mental Status: She is alert and oriented to person, place, and time.     Cranial Nerves: No cranial nerve deficit.  Psychiatric:        Mood and Affect: Mood normal.        Behavior: Behavior normal.        Thought Content: Thought content normal.        Judgment: Judgment normal.         Assessment And Plan:     1. Chronic pain of both shoulders  Right shoulder worse than left   Would like referral to orthopedics for further evaluation  Will give toradol injection and encouraged to use pain cream as needed - Ambulatory referral to Orthopedic Surgery - ketorolac (TORADOL) 30 MG/ML injection 30 mg  2. Influenza vaccination declined  Patient declined influenza vaccination at this time. Patient is aware that influenza vaccine prevents illness in 70% of healthy people, and reduces hospitalizations to 30-70% in elderly. This vaccine is recommended annually. Pt is willing to accept risk associated with refusing vaccination.      Patient was given opportunity to ask questions. Patient verbalized understanding of the plan and was able to repeat key elements of the plan. All questions were answered to their satisfaction.  Arnette Felts, FNP   I, Arnette Felts, FNP, have  reviewed all documentation for this visit. The documentation on 06/30/20 for the exam, diagnosis, procedures, and orders are all accurate and complete.  THE PATIENT IS ENCOURAGED TO PRACTICE SOCIAL DISTANCING DUE TO THE COVID-19 PANDEMIC.

## 2020-06-18 NOTE — Patient Instructions (Signed)
Shoulder Pain Many things can cause shoulder pain, including:  An injury.  Moving the shoulder in the same way again and again (overuse).  Joint pain (arthritis). Pain can come from:  Swelling and irritation (inflammation) of any part of the shoulder.  An injury to the shoulder joint.  An injury to: ? Tissues that connect muscle to bone (tendons). ? Tissues that connect bones to each other (ligaments). ? Bones. Follow these instructions at home: Watch for changes in your symptoms. Let your doctor know about them. Follow these instructions to help with your pain. If you have a sling:  Wear the sling as told by your doctor. Remove it only as told by your doctor.  Loosen the sling if your fingers: ? Tingle. ? Become numb. ? Turn cold and blue.  Keep the sling clean.  If the sling is not waterproof: ? Do not let it get wet. ? Take the sling off when you shower or bathe. Managing pain, stiffness, and swelling   If told, put ice on the painful area: ? Put ice in a plastic bag. ? Place a towel between your skin and the bag. ? Leave the ice on for 20 minutes, 2-3 times a day. Stop putting ice on if it does not help with the pain.  Squeeze a soft ball or a foam pad as much as possible. This prevents swelling in the shoulder. It also helps to strengthen the arm. General instructions  Take over-the-counter and prescription medicines only as told by your doctor.  Keep all follow-up visits as told by your doctor. This is important. Contact a doctor if:  Your pain gets worse.  Medicine does not help your pain.  You have new pain in your arm, hand, or fingers. Get help right away if:  Your arm, hand, or fingers: ? Tingle. ? Are numb. ? Are swollen. ? Are painful. ? Turn white or blue. Summary  Shoulder pain can be caused by many things. These include injury, moving the shoulder in the same away again and again, and joint pain.  Watch for changes in your symptoms.  Let your doctor know about them.  This condition may be treated with a sling, ice, and pain medicine.  Contact your doctor if the pain gets worse or you have new pain. Get help right away if your arm, hand, or fingers tingle or get numb, swollen, or painful.  Keep all follow-up visits as told by your doctor. This is important. This information is not intended to replace advice given to you by your health care provider. Make sure you discuss any questions you have with your health care provider. Document Revised: 12/21/2017 Document Reviewed: 12/21/2017 Elsevier Patient Education  2020 Elsevier Inc.  

## 2020-06-22 HISTORY — PX: OTHER SURGICAL HISTORY: SHX169

## 2020-07-22 MED ORDER — KETOROLAC TROMETHAMINE 30 MG/ML IJ SOLN
30.0000 mg | Freq: Once | INTRAMUSCULAR | Status: AC
  Administered 2020-06-18: 30 mg via INTRAMUSCULAR

## 2020-07-22 NOTE — Addendum Note (Signed)
Addended by: Roxine Caddy on: 07/22/2020 02:13 PM   Modules accepted: Orders

## 2020-08-05 ENCOUNTER — Other Ambulatory Visit: Payer: Self-pay | Admitting: Cardiology

## 2020-09-16 ENCOUNTER — Ambulatory Visit: Payer: BC Managed Care – PPO | Admitting: Nurse Practitioner

## 2020-09-30 ENCOUNTER — Encounter: Payer: Self-pay | Admitting: Nurse Practitioner

## 2020-09-30 ENCOUNTER — Other Ambulatory Visit: Payer: Self-pay

## 2020-09-30 ENCOUNTER — Ambulatory Visit (INDEPENDENT_AMBULATORY_CARE_PROVIDER_SITE_OTHER): Payer: BC Managed Care – PPO | Admitting: Nurse Practitioner

## 2020-09-30 VITALS — BP 128/76 | HR 78 | Temp 98.4°F | Ht 62.8 in | Wt 159.6 lb

## 2020-09-30 DIAGNOSIS — Z23 Encounter for immunization: Secondary | ICD-10-CM

## 2020-09-30 DIAGNOSIS — R7309 Other abnormal glucose: Secondary | ICD-10-CM | POA: Diagnosis not present

## 2020-09-30 DIAGNOSIS — I10 Essential (primary) hypertension: Secondary | ICD-10-CM

## 2020-09-30 DIAGNOSIS — R252 Cramp and spasm: Secondary | ICD-10-CM

## 2020-09-30 DIAGNOSIS — R140 Abdominal distension (gaseous): Secondary | ICD-10-CM

## 2020-09-30 DIAGNOSIS — I700 Atherosclerosis of aorta: Secondary | ICD-10-CM | POA: Diagnosis not present

## 2020-09-30 MED ORDER — TETANUS-DIPHTH-ACELL PERTUSSIS 5-2.5-18.5 LF-MCG/0.5 IM SUSY
0.5000 mL | PREFILLED_SYRINGE | Freq: Once | INTRAMUSCULAR | Status: AC
  Administered 2020-09-30: 0.5 mL via INTRAMUSCULAR

## 2020-09-30 MED ORDER — MAGNESIUM 250 MG PO TABS: 1.0000 | ORAL_TABLET | Freq: Every evening | ORAL | 1 refills | Status: DC
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
## 2020-09-30 NOTE — Patient Instructions (Addendum)
Hypertension, Adult High blood pressure (hypertension) is when the force of blood pumping through the arteries is too strong. The arteries are the blood vessels that carry blood from the heart throughout the body. Hypertension forces the heart to work harder to pump blood and may cause arteries to become narrow or stiff. Untreated or uncontrolled hypertension can cause a heart attack, heart failure, a stroke, kidney disease, and other problems. A blood pressure reading consists of a higher number over a lower number. Ideally, your blood pressure should be below 120/80. The first ("top") number is called the systolic pressure. It is a measure of the pressure in your arteries as your heart beats. The second ("bottom") number is called the diastolic pressure. It is a measure of the pressure in your arteries as the heart relaxes. What are the causes? The exact cause of this condition is not known. There are some conditions that result in or are related to high blood pressure. What increases the risk? Some risk factors for high blood pressure are under your control. The following factors may make you more likely to develop this condition:  Smoking.  Having type 2 diabetes mellitus, high cholesterol, or both.  Not getting enough exercise or physical activity.  Being overweight.  Having too much fat, sugar, calories, or salt (sodium) in your diet.  Drinking too much alcohol. Some risk factors for high blood pressure may be difficult or impossible to change. Some of these factors include:  Having chronic kidney disease.  Having a family history of high blood pressure.  Age. Risk increases with age.  Race. You may be at higher risk if you are African American.  Gender. Men are at higher risk than women before age 45. After age 65, women are at higher risk than men.  Having obstructive sleep apnea.  Stress. What are the signs or symptoms? High blood pressure may not cause symptoms. Very high  blood pressure (hypertensive crisis) may cause:  Headache.  Anxiety.  Shortness of breath.  Nosebleed.  Nausea and vomiting.  Vision changes.  Severe chest pain.  Seizures. How is this diagnosed? This condition is diagnosed by measuring your blood pressure while you are seated, with your arm resting on a flat surface, your legs uncrossed, and your feet flat on the floor. The cuff of the blood pressure monitor will be placed directly against the skin of your upper arm at the level of your heart. It should be measured at least twice using the same arm. Certain conditions can cause a difference in blood pressure between your right and left arms. Certain factors can cause blood pressure readings to be lower or higher than normal for a short period of time:  When your blood pressure is higher when you are in a health care provider's office than when you are at home, this is called white coat hypertension. Most people with this condition do not need medicines.  When your blood pressure is higher at home than when you are in a health care provider's office, this is called masked hypertension. Most people with this condition may need medicines to control blood pressure. If you have a high blood pressure reading during one visit or you have normal blood pressure with other risk factors, you may be asked to:  Return on a different day to have your blood pressure checked again.  Monitor your blood pressure at home for 1 week or longer. If you are diagnosed with hypertension, you may have other blood or   imaging tests to help your health care provider understand your overall risk for other conditions. How is this treated? This condition is treated by making healthy lifestyle changes, such as eating healthy foods, exercising more, and reducing your alcohol intake. Your health care provider may prescribe medicine if lifestyle changes are not enough to get your blood pressure under control, and  if:  Your systolic blood pressure is above 130.  Your diastolic blood pressure is above 80. Your personal target blood pressure may vary depending on your medical conditions, your age, and other factors. Follow these instructions at home: Eating and drinking  Eat a diet that is high in fiber and potassium, and low in sodium, added sugar, and fat. An example eating plan is called the DASH (Dietary Approaches to Stop Hypertension) diet. To eat this way: ? Eat plenty of fresh fruits and vegetables. Try to fill one half of your plate at each meal with fruits and vegetables. ? Eat whole grains, such as whole-wheat pasta, brown rice, or whole-grain bread. Fill about one fourth of your plate with whole grains. ? Eat or drink low-fat dairy products, such as skim milk or low-fat yogurt. ? Avoid fatty cuts of meat, processed or cured meats, and poultry with skin. Fill about one fourth of your plate with lean proteins, such as fish, chicken without skin, beans, eggs, or tofu. ? Avoid pre-made and processed foods. These tend to be higher in sodium, added sugar, and fat.  Reduce your daily sodium intake. Most people with hypertension should eat less than 1,500 mg of sodium a day.  Do not drink alcohol if: ? Your health care provider tells you not to drink. ? You are pregnant, may be pregnant, or are planning to become pregnant.  If you drink alcohol: ? Limit how much you use to:  0-1 drink a day for women.  0-2 drinks a day for men. ? Be aware of how much alcohol is in your drink. In the [place] one drink equals one 12 oz bottle of beer (355 mL), one 5 oz glass of wine (148 mL), or one 1 oz glass of hard liquor (44 mL).   Lifestyle  Work with your health care provider to maintain a healthy body weight or to lose weight. Ask what an ideal weight is for you.  Get at least 30 minutes of exercise most days of the week. Activities may include walking, swimming, or biking.  Include exercise to  strengthen your muscles (resistance exercise), such as Pilates or lifting weights, as part of your weekly exercise routine. Try to do these types of exercises for 30 minutes at least 3 days a week.  Do not use any products that contain nicotine or tobacco, such as cigarettes, e-cigarettes, and chewing tobacco. If you need help quitting, ask your health care provider.  Monitor your blood pressure at home as told by your health care provider.  Keep all follow-up visits as told by your health care provider. This is important.   Medicines  Take over-the-counter and prescription medicines only as told by your health care provider. Follow directions carefully. Blood pressure medicines must be taken as prescribed.  Do not skip doses of blood pressure medicine. Doing this puts you at risk for problems and can make the medicine less effective.  Ask your health care provider about side effects or reactions to medicines that you should watch for. Contact a health care provider if you:  Think you are having a reaction to a   medicine you are taking.  Have headaches that keep coming back (recurring).  Feel dizzy.  Have swelling in your ankles.  Have trouble with your vision. Get help right away if you:  Develop a severe headache or confusion.  Have unusual weakness or numbness.  Feel faint.  Have severe pain in your chest or abdomen.  Vomit repeatedly.  Have trouble breathing. Summary  Hypertension is when the force of blood pumping through your arteries is too strong. If this condition is not controlled, it may put you at risk for serious complications.  Your personal target blood pressure may vary depending on your medical conditions, your age, and other factors. For most people, a normal blood pressure is less than 120/80.  Hypertension is treated with lifestyle changes, medicines, or a combination of both. Lifestyle changes include losing weight, eating a healthy, low-sodium diet,  exercising more, and limiting alcohol. This information is not intended to replace advice given to you by your health care provider. Make sure you discuss any questions you have with your health care provider. Document Revised: 02/16/2018 Document Reviewed: 02/16/2018 Elsevier Patient Education  2021 [employer].  Tdap (Tetanus, Diphtheria, Pertussis) Vaccine: What You Need to Know 1. Why get vaccinated? Tdap vaccine can prevent tetanus, diphtheria, and pertussis. Diphtheria and pertussis spread from person to person. Tetanus enters the body through cuts or wounds.  TETANUS (T) causes painful stiffening of the muscles. Tetanus can lead to serious health problems, including being unable to open the mouth, having trouble swallowing and breathing, or death.  DIPHTHERIA (D) can lead to difficulty breathing, heart failure, paralysis, or death.  PERTUSSIS (aP), also known as "whooping cough," can cause uncontrollable, violent coughing that makes it hard to breathe, eat, or drink. Pertussis can be extremely serious especially in babies and young children, causing pneumonia, convulsions, brain damage, or death. In teens and adults, it can cause weight loss, loss of bladder control, passing out, and rib fractures from severe coughing. 2. Tdap vaccine Tdap is only for children 7 years and older, adolescents, and adults.  Adolescents should receive a single dose of Tdap, preferably at age 78 or 59 years. Pregnant people should get a dose of Tdap during every pregnancy, preferably during the early part of the third trimester, to help protect the newborn from pertussis. Infants are most at risk for severe, life-threatening complications from pertussis. Adults who have never received Tdap should get a dose of Tdap. Also, adults should receive a booster dose of either Tdap or Td (a different vaccine that protects against tetanus and diphtheria but not pertussis) every 10 years, or after 5 years in the case of  a severe or dirty wound or burn. Tdap may be given at the same time as other vaccines. 3. Talk with your health care provider Tell your vaccine provider if the person getting the vaccine:  Has had an allergic reaction after a previous dose of any vaccine that protects against tetanus, diphtheria, or pertussis, or has any severe, life-threatening allergies  Has had a coma, decreased level of consciousness, or prolonged seizures within 7 days after a previous dose of any pertussis vaccine (DTP, DTaP, or Tdap)  Has seizures or another nervous system problem  Has ever had Guillain-Barr Syndrome (also called "GBS")  Has had severe pain or swelling after a previous dose of any vaccine that protects against tetanus or diphtheria In some cases, your health care provider may decide to postpone Tdap vaccination until a future visit. People with  minor illnesses, such as a cold, may be vaccinated. People who are moderately or severely ill should usually wait until they recover before getting Tdap vaccine.  Your health care provider can give you more information. 4. Risks of a vaccine reaction  Pain, redness, or swelling where the shot was given, mild fever, headache, feeling tired, and nausea, vomiting, diarrhea, or stomachache sometimes happen after Tdap vaccination. People sometimes faint after medical procedures, including vaccination. Tell your provider if you feel dizzy or have vision changes or ringing in the ears.  As with any medicine, there is a very remote chance of a vaccine causing a severe allergic reaction, other serious injury, or death. 5. What if there is a serious problem? An allergic reaction could occur after the vaccinated person leaves the clinic. If you see signs of a severe allergic reaction (hives, swelling of the face and throat, difficulty breathing, a fast heartbeat, dizziness, or weakness), call 9-1-1 and get the person to the nearest hospital. For other signs that concern  you, call your health care provider.  Adverse reactions should be reported to the Vaccine Adverse Event Reporting System (VAERS). Your health care provider will usually file this report, or you can do it yourself. Visit the VAERS website at www.vaers.SamedayNews.es or call 7073091072. VAERS is only for reporting reactions, and VAERS staff members do not give medical advice. 6. The National Vaccine Injury Compensation Program The [employer] Vaccine Injury Compensation Program (VICP) is a federal program that was created to compensate people who may have been injured by certain vaccines. Claims regarding alleged injury or death due to vaccination have a time limit for filing, which may be as short as two years. Visit the VICP website at GoldCloset.com.ee or call (819)316-9239 to learn about the program and about filing a claim. 7. How can I learn more?  Ask your health care provider.  Call your local or state health department.  Visit the website of the Food and Drug Administration (FDA) for vaccine package inserts and additional information at TraderRating.uy.  Contact the Centers for Disease Control and Prevention (CDC): ? Call (864)422-3551 (1-800-CDC-INFO) or ? Visit CDC's website at http://hunter.com/. Vaccine Information Statement Tdap (Tetanus, Diphtheria, Pertussis) Vaccine (01/26/2020) This information is not intended to replace advice given to you by your health care provider. Make sure you discuss any questions you have with your health care provider. Document Revised: 02/21/2020 Document Reviewed: 02/21/2020 Elsevier Patient Education  2021 [employer].

## 2020-10-01 LAB — CMP14+EGFR
ALT: 32 IU/L (ref 0–32)
AST: 19 IU/L (ref 0–40)
Albumin/Globulin Ratio: 2.1 (ref 1.2–2.2)
Albumin: 5.1 g/dL — ABNORMAL HIGH (ref 3.8–4.9)
Alkaline Phosphatase: 64 IU/L (ref 44–121)
BUN/Creatinine Ratio: 28 — ABNORMAL HIGH (ref 9–23)
BUN: 27 mg/dL — ABNORMAL HIGH (ref 6–24)
Bilirubin Total: 0.3 mg/dL (ref 0.0–1.2)
CO2: 22 mmol/L (ref 20–29)
Calcium: 10.5 mg/dL — ABNORMAL HIGH (ref 8.7–10.2)
Chloride: 102 mmol/L (ref 96–106)
Creatinine, Ser: 0.98 mg/dL (ref 0.57–1.00)
Globulin, Total: 2.4 g/dL (ref 1.5–4.5)
Glucose: 88 mg/dL (ref 65–99)
Potassium: 4.5 mmol/L (ref 3.5–5.2)
Sodium: 141 mmol/L (ref 134–144)
Total Protein: 7.5 g/dL (ref 6.0–8.5)
eGFR: 67 mL/min/{1.73_m2} (ref >59)

## 2020-10-01 LAB — HEMOGLOBIN A1C
Est. average glucose Bld gHb Est-mCnc: 131 mg/dL
Hgb A1c MFr Bld: 6.2 % — ABNORMAL HIGH (ref 4.8–5.6)

## 2020-10-01 LAB — LIPID PANEL
Chol/HDL Ratio: 2.4 ratio (ref 0.0–4.4)
Cholesterol, Total: 218 mg/dL — ABNORMAL HIGH (ref 100–199)
HDL: 91 mg/dL (ref >39)
LDL Chol Calc (NIH): 110 mg/dL — ABNORMAL HIGH (ref 0–99)
Triglycerides: 98 mg/dL (ref 0–149)
VLDL Cholesterol Cal: 17 mg/dL (ref 5–40)

## 2020-10-02 ENCOUNTER — Ambulatory Visit
Admission: RE | Admit: 2020-10-02 | Discharge: 2020-10-02 | Disposition: A | Discharge status: Home / Self Care | Payer: BC Managed Care – PPO | Source: Ambulatory Visit | Attending: Nurse Practitioner | Admitting: Nurse Practitioner

## 2020-10-02 DIAGNOSIS — R140 Abdominal distension (gaseous): Secondary | ICD-10-CM

## 2020-11-02 ENCOUNTER — Other Ambulatory Visit: Payer: Self-pay | Admitting: Cardiology

## 2020-12-06 ENCOUNTER — Other Ambulatory Visit: Payer: Self-pay | Admitting: Nurse Practitioner

## 2020-12-06 DIAGNOSIS — I10 Essential (primary) hypertension: Secondary | ICD-10-CM

## 2021-01-06 ENCOUNTER — Encounter: Payer: BC Managed Care – PPO | Admitting: Nurse Practitioner

## 2021-01-10 ENCOUNTER — Other Ambulatory Visit: Payer: Self-pay | Admitting: Cardiology

## 2021-01-10 DIAGNOSIS — I10 Essential (primary) hypertension: Secondary | ICD-10-CM

## 2021-02-11 ENCOUNTER — Ambulatory Visit (INDEPENDENT_AMBULATORY_CARE_PROVIDER_SITE_OTHER): Payer: BC Managed Care – PPO | Admitting: Nurse Practitioner

## 2021-02-11 ENCOUNTER — Other Ambulatory Visit: Payer: Self-pay

## 2021-02-11 ENCOUNTER — Encounter: Payer: Self-pay | Admitting: Nurse Practitioner

## 2021-02-11 VITALS — BP 116/70 | HR 99 | Temp 98.4°F | Ht 62.8 in | Wt 163.6 lb

## 2021-02-11 DIAGNOSIS — E559 Vitamin D deficiency, unspecified: Secondary | ICD-10-CM

## 2021-02-11 DIAGNOSIS — I10 Essential (primary) hypertension: Secondary | ICD-10-CM

## 2021-02-11 DIAGNOSIS — R7309 Other abnormal glucose: Secondary | ICD-10-CM | POA: Diagnosis not present

## 2021-02-11 DIAGNOSIS — I700 Atherosclerosis of aorta: Secondary | ICD-10-CM | POA: Diagnosis not present

## 2021-02-11 DIAGNOSIS — Z0000 Encounter for general adult medical examination without abnormal findings: Secondary | ICD-10-CM

## 2021-02-11 DIAGNOSIS — Z716 Tobacco abuse counseling: Secondary | ICD-10-CM

## 2021-02-11 LAB — POCT UA - MICROALBUMIN
Albumin/Creatinine Ratio, Urine, POC: <30
Creatinine, POC: 50 mg/dL
Microalbumin Ur, POC: 10 mg/L

## 2021-02-11 LAB — POCT URINALYSIS DIPSTICK
Bilirubin, UA: NEGATIVE
Blood, UA: NEGATIVE
Glucose, UA: NEGATIVE
Ketones, UA: NEGATIVE
Leukocytes, UA: NEGATIVE
Nitrite, UA: NEGATIVE
Protein, UA: NEGATIVE
Spec Grav, UA: 1.02 (ref 1.010–1.025)
Urobilinogen, UA: 0.2 E.U./dL
pH, UA: 6 (ref 5.0–8.0)

## 2021-02-11 NOTE — Patient Instructions (Signed)
Health Maintenance, Female Adopting a healthy lifestyle and getting preventive care are important in promoting health and wellness. Ask your health care provider about: The right schedule for you to have regular tests and exams. Things you can do on your own to prevent diseases and keep yourself healthy. What should I know about diet, weight, and exercise? Eat a healthy diet  Eat a diet that includes plenty of vegetables, fruits, low-fat dairy products, and lean protein. Do not eat a lot of foods that are high in solid fats, added sugars, or sodium.  Maintain a healthy weight Body mass index (BMI) is used to identify weight problems. It estimates body fat based on height and weight. Your health care provider can help determineyour BMI and help you achieve or maintain a healthy weight. Get regular exercise Get regular exercise. This is one of the most important things you can do for your health. Most adults should: Exercise for at least 150 minutes each week. The exercise should increase your heart rate and make you sweat (moderate-intensity exercise). Do strengthening exercises at least twice a week. This is in addition to the moderate-intensity exercise. Spend less time sitting. Even light physical activity can be beneficial. Watch cholesterol and blood lipids Have your blood tested for lipids and cholesterol at 59 years of age, then havethis test every 5 years. Have your cholesterol levels checked more often if: Your lipid or cholesterol levels are high. You are older than 59 years of age. You are at high risk for heart disease. What should I know about cancer screening? Depending on your health history and family history, you may need to have cancer screening at various ages. This may include screening for: Breast cancer. Cervical cancer. Colorectal cancer. Skin cancer. Lung cancer. What should I know about heart disease, diabetes, and high blood pressure? Blood pressure and heart  disease High blood pressure causes heart disease and increases the risk of stroke. This is more likely to develop in people who have high blood pressure readings, are of African descent, or are overweight. Have your blood pressure checked: Every 3-5 years if you are 18-39 years of age. Every year if you are 40 years old or older. Diabetes Have regular diabetes screenings. This checks your fasting blood sugar level. Have the screening done: Once every three years after age 40 if you are at a normal weight and have a low risk for diabetes. More often and at a younger age if you are overweight or have a high risk for diabetes. What should I know about preventing infection? Hepatitis B If you have a higher risk for hepatitis B, you should be screened for this virus. Talk with your health care provider to find out if you are at risk forhepatitis B infection. Hepatitis C Testing is recommended for: Everyone born from 1945 through 1965. Anyone with known risk factors for hepatitis C. Sexually transmitted infections (STIs) Get screened for STIs, including gonorrhea and chlamydia, if: You are sexually active and are younger than 59 years of age. You are older than 59 years of age and your health care provider tells you that you are at risk for this type of infection. Your sexual activity has changed since you were last screened, and you are at increased risk for chlamydia or gonorrhea. Ask your health care provider if you are at risk. Ask your health care provider about whether you are at high risk for HIV. Your health care provider may recommend a prescription medicine to help   prevent HIV infection. If you choose to take medicine to prevent HIV, you should first get tested for HIV. You should then be tested every 3 months for as long as you are taking the medicine. Pregnancy If you are about to stop having your period (premenopausal) and you may become pregnant, seek counseling before you get  pregnant. Take 400 to 800 micrograms (mcg) of folic acid every day if you become pregnant. Ask for birth control (contraception) if you want to prevent pregnancy. Osteoporosis and menopause Osteoporosis is a disease in which the bones lose minerals and strength with aging. This can result in bone fractures. If you are 65 years old or older, or if you are at risk for osteoporosis and fractures, ask your health care provider if you should: Be screened for bone loss. Take a calcium or vitamin D supplement to lower your risk of fractures. Be given hormone replacement therapy (HRT) to treat symptoms of menopause. Follow these instructions at home: Lifestyle Do not use any products that contain nicotine or tobacco, such as cigarettes, e-cigarettes, and chewing tobacco. If you need help quitting, ask your health care provider. Do not use street drugs. Do not share needles. Ask your health care provider for help if you need support or information about quitting drugs. Alcohol use Do not drink alcohol if: Your health care provider tells you not to drink. You are pregnant, may be pregnant, or are planning to become pregnant. If you drink alcohol: Limit how much you use to 0-1 drink a day. Limit intake if you are breastfeeding. Be aware of how much alcohol is in your drink. In the [place] one drink equals one 12 oz bottle of beer (355 mL), one 5 oz glass of wine (148 mL), or one 1 oz glass of hard liquor (44 mL). General instructions Schedule regular health, dental, and eye exams. Stay current with your vaccines. Tell your health care provider if: You often feel depressed. You have ever been abused or do not feel safe at home. Summary Adopting a healthy lifestyle and getting preventive care are important in promoting health and wellness. Follow your health care provider's instructions about healthy diet, exercising, and getting tested or screened for diseases. Follow your health care provider's  instructions on monitoring your cholesterol and blood pressure. This information is not intended to replace advice given to you by your health care provider. Make sure you discuss any questions you have with your healthcare provider. Document Revised: 06/01/2018 Document Reviewed: 06/01/2018 Elsevier Patient Education  2022 [employer] Inc.  

## 2021-02-11 NOTE — Progress Notes (Signed)
I,Evelyn [name],acting as a Education administrator for [employer], NP.,have documented all relevant documentation on the behalf of [employer], NP,as directed by  Evelyn Castilla, NP while in the presence of Evelyn Castilla, NP.  This visit occurred during the SARS-CoV-2 public health emergency.  Safety protocols were in place, including screening questions prior to the visit, additional usage of staff PPE, and extensive cleaning of exam room while observing appropriate contact time as indicated for disinfecting solutions.  Subjective:     Patient ID: Evelyn [name] , female    DOB: 14-Jan-1961 , 60 y.o.   MRN: [id number]   Chief Complaint  Patient presents with   Annual Exam    HPI  The patient is here today for a physical examination. The patient is followed by Dr. Gertie [name] for [place] last pap and mammogram was April 2022. She saw the cardiologist in Aug of 2022. She is getting therapy with Dr. Elberta Fortis for a couple of months. She is doing a lot better than she was in Dec of last year when she went to the ED. She has been using some coping skills the therapist has taught her. She has been doing a lot of mediation and relaxation methods.  Diet: she is drinking smoothies and healthy drinks. She is  Exercise: She walks a lot at work.  Smoking: cigarettes; 8-10 daily; 36 years history.  Drink: she does occasional beer     Past Medical History:  Diagnosis Date   Anxiety    Arthritis    Blood transfusion without reported diagnosis    Calcification of abdominal aorta ([place]) 11/11/2017   Hyperlipidemia    Hypertension      Family History  Problem Relation Age of Onset   Diabetes Mother    COPD Mother    Renal Disease Mother    Heart attack Father 22   Stroke Brother    Colon cancer Neg Hx    Esophageal cancer Neg Hx    Liver cancer Neg Hx    Pancreatic cancer Neg Hx    Rectal cancer Neg Hx    Stomach cancer Neg Hx      Current Outpatient Medications:    amLODipine (NORVASC)  10 MG tablet, TAKE 1 TABLET BY MOUTH EVERY DAY, Disp: 90 tablet, Rfl: 2   aspirin 81 MG chewable tablet, Chew 81 mg by mouth daily., Disp: , Rfl:    Calcium Polycarbophil (FIBER) 625 MG TABS, Take by mouth., Disp: , Rfl:    cholecalciferol (VITAMIN D3) 25 MCG (1000 UT) tablet, Take 1,000 Units by mouth daily., Disp: , Rfl:    cycloSPORINE (RESTASIS) 0.05 % ophthalmic emulsion, Place 1 drop into both eyes 2 (two) times daily., Disp: 0.4 mL, Rfl: 0   diclofenac Sodium (VOLTAREN) 1 % GEL, Apply 2 g topically 4 (four) times daily. (Patient taking differently: Apply 2 g topically 4 (four) times daily as needed (pain).), Disp: 100 g, Rfl: 2   losartan-hydrochlorothiazide (HYZAAR) 100-25 MG tablet, TAKE 1 TABLET BY MOUTH EVERY DAY, Disp: 90 tablet, Rfl: 1   Magnesium 250 MG TABS, Take 1 tablet (250 mg total) by mouth every evening. Take with evening meal, Disp: 90 tablet, Rfl: 1   meloxicam (MOBIC) 15 MG tablet, TAKE 1 TABLET BY MOUTH ONCE A DAY WITH FOOD FOR INFLAMMATION, Disp: , Rfl:    Multiple Vitamins-Minerals (MULTIVITAMIN WOMENS 50+ ADV PO), Take 1 tablet by mouth daily., Disp: , Rfl:    rosuvastatin (CRESTOR) 10 MG tablet, TAKE 1 TABLET BY  MOUTH EVERY DAY, Disp: 90 tablet, Rfl: 1   spironolactone (ALDACTONE) 25 MG tablet, TAKE 1 TABLET BY MOUTH EVERY DAY, Disp: 90 tablet, Rfl: 3   vitamin B-12 (CYANOCOBALAMIN) 1000 MCG tablet, Take 1,000 mcg by mouth daily., Disp: , Rfl:    escitalopram (LEXAPRO) 20 MG tablet, Take 1 tablet (20 mg total) by mouth daily., Disp: 30 tablet, Rfl: 0   Allergies  Allergen Reactions   Lisinopril Other (See Comments)    Intolerance cough       The patient states she uses none for birth control. Last LMP was Patient's last menstrual period was 07/16/2017.. Negative for Dysmenorrhea. Negative for: breast discharge, breast lump(s), breast pain and breast self exam. Associated symptoms include abnormal vaginal bleeding. Pertinent negatives include abnormal bleeding  (hematology), anxiety, decreased libido, depression, difficulty falling sleep, dyspareunia, history of infertility, nocturia, sexual dysfunction, sleep disturbances, urinary incontinence, urinary urgency, vaginal discharge and vaginal itching. Diet regular.The patient states her exercise [name] is    . The patient's tobacco use is:  Social History   Tobacco Use  Smoking Status Every Day   Packs/day: 0.50   Years: 30.00   Pack years: 15.00   Types: Cigarettes  Smokeless Tobacco Never  Tobacco Comments   she is down to 5 cigarettes per day  . She has been exposed to passive smoke. The patient's alcohol use is:  Social History   Substance and Sexual Activity  Alcohol Use Yes   Alcohol/week: 2.0 standard drinks   Types: 2 Cans of beer per week   Comment: Beer 2 daily  . Additional information: Last pap , next one scheduled for .    Review of Systems  Constitutional: Negative.   HENT: Negative.    Eyes: Negative.   Respiratory: Negative.    Cardiovascular: Negative.   Gastrointestinal: Negative.   Endocrine: Negative.   Genitourinary: Negative.   Musculoskeletal: Negative.   Skin: Negative.   Allergic/Immunologic: Negative.   Neurological: Negative.   Hematological: Negative.   Psychiatric/Behavioral: Negative.      Today's Vitals   02/11/21 1513  BP: 116/70  Pulse: 99  Temp: 98.4 F (36.9 C)  TempSrc: Oral  Weight: 163 lb 9.6 oz (74.2 kg)  Height: 5' 2.8" (1.595 m)   Body mass index is 29.17 kg/m.  Wt Readings from Last 3 Encounters:  02/11/21 163 lb 9.6 oz (74.2 kg)  09/30/20 159 lb 9.6 oz (72.4 kg)  06/18/20 157 lb 3.2 oz (71.3 kg)    BP Readings from Last 3 Encounters:  02/11/21 116/70  09/30/20 128/76  06/18/20 112/62    Objective:  Physical Exam Vitals and nursing note reviewed.  Constitutional:      Appearance: Normal appearance.  HENT:     Head: Normocephalic and atraumatic.     Right Ear: Tympanic membrane, ear canal and external ear normal.      Left Ear: Tympanic membrane, ear canal and external ear normal.     Nose: Nose normal.     Mouth/Throat:     Mouth: Mucous membranes are moist.     Pharynx: Oropharynx is clear.  Eyes:     Extraocular Movements: Extraocular movements intact.     Conjunctiva/sclera: Conjunctivae normal.     Pupils: Pupils are equal, round, and reactive to light.  Cardiovascular:     Rate and Rhythm: Normal rate and regular rhythm.     Pulses: Normal pulses.     Heart sounds: Normal heart sounds.  Pulmonary:  Effort: Pulmonary effort is normal.     Breath sounds: Normal breath sounds.  Chest:  Breasts:    Tanner Score is 5.     Right: Normal.     Left: Normal.  Abdominal:     General: Abdomen is flat. Bowel sounds are normal.     Palpations: Abdomen is soft.  Genitourinary:    Comments: deferred Musculoskeletal:        General: Normal range of motion.     Cervical back: Normal range of motion and neck supple.  Skin:    General: Skin is warm and dry.  Neurological:     General: No focal deficit present.     Mental Status: She is alert and oriented to person, place, and time.  Psychiatric:        Mood and Affect: Mood normal.        Behavior: Behavior normal.        Assessment And Plan:     1. Health maintenance examination -Patient is here for their annual physical exam and we discussed any changes to medication and medical history.  -Behavior modification was discussed as well as diet and exercise history  -Patient will continue to exercise regularly and modify their diet.  -Recommendation for yearly physical annuals, immunization and screenings including mammogram and colonoscopy were discussed with the patient.  -Recommended intake of multivitamin, vitamin D and calcium.  -Individualized advise was given to the patient pertaining to their own health history in regards to diet, exercise, medical condition and referrals.   2. Essential hypertension -Limit the intake of processed  foods and salt intake. You should increase your intake of green vegetables and fruits. Limit the use of alcohol. Limit fast foods and fried foods. Avoid high fatty saturated and trans fat foods. Keep yourself hydrated with drinking water. Avoid red meats. Eat lean meats instead. Exercise for atleast 30-45 min for atleast 4-5 times a week.  -Chronic, stable  - CBC - CMP14+EGFR - POCT Urinalysis Dipstick (81002) - POCT UA - Microalbumin - EKG 12-Lead- Follow up with cardiologist   3. Abnormal glucose - Hemoglobin A1c  4. Aortic atherosclerosis ([place]) --Educated patient about a diet that is low in fat and high fatty foods including dairy products. Increase in take of fish and fiber. Decrease intake of red meats and fast foods. Exercise for atleast 4-5 times a week or atleast 30-45 min. Drink a lot of water.   - Lipid panel  5. Vitamin D deficiency -Will check and supplement if needed. Advised patient to spend atleast 15 min. Daily in sunlight.  - Vitamin D (25 hydroxy)  6. Encounter for smoking cessation counseling -Counseled patient about cessation of smoking.  -Ready to quit: Yes  Counseling given: Yes  Tobacco comments: She is trying to quit. Down to 8-9 cigarettes a day.   Follow up: if symptoms persist or do not get better.   The patient was encouraged to call or send a message through [facility] for any questions or concerns.   Staying healthy and adopting a healthy lifestyle for your overall health is important. You should eat 7 or more servings of fruits and vegetables per day. You should drink plenty of water to keep yourself hydrated and your kidneys healthy. This includes about 65-80+ fluid ounces of water. Limit your intake of animal fats especially for elevated cholesterol. Avoid highly processed food and limit your salt intake if you have hypertension. Avoid foods high in saturated/Trans fats. Along with a healthy diet  it is also very important to maintain time for yourself to  maintain a healthy mental health with low stress levels. You should get atleast 150 min of moderate intensity exercise weekly for a healthy heart. Along with eating right and exercising, aim for at least 7-9 hours of sleep daily.  Eat more whole grains which includes barley, wheat berries, oats, brown rice and whole wheat pasta. Use healthy plant oils which include olive, soy, corn, sunflower and peanut. Limit your caffeine and sugary drinks. Limit your intake of fast foods. Limit milk and dairy products to one or two daily servings.   Patient was given opportunity to ask questions. Patient verbalized understanding of the plan and was able to repeat key elements of the plan. All questions were answered to their satisfaction.  Raman Levy Cedano, DNP   I, Raman Rahma Meller have reviewed all documentation for this visit. The documentation on 10/31/20 for the exam, diagnosis, procedures, and orders are all accurate and complete.   THE PATIENT IS ENCOURAGED TO PRACTICE SOCIAL DISTANCING DUE TO THE COVID-19 PANDEMIC.

## 2021-02-12 LAB — LIPID PANEL
Chol/HDL Ratio: 3.4 ratio (ref 0.0–4.4)
Cholesterol, Total: 217 mg/dL — ABNORMAL HIGH (ref 100–199)
HDL: 64 mg/dL (ref >39)
LDL Chol Calc (NIH): 113 mg/dL — ABNORMAL HIGH (ref 0–99)
Triglycerides: 236 mg/dL — ABNORMAL HIGH (ref 0–149)
VLDL Cholesterol Cal: 40 mg/dL (ref 5–40)

## 2021-02-12 LAB — CMP14+EGFR
ALT: 20 IU/L (ref 0–32)
AST: 14 IU/L (ref 0–40)
Albumin/Globulin Ratio: 2 (ref 1.2–2.2)
Albumin: 4.6 g/dL (ref 3.8–4.9)
Alkaline Phosphatase: 63 IU/L (ref 44–121)
BUN/Creatinine Ratio: 19 (ref 9–23)
BUN: 18 mg/dL (ref 6–24)
Bilirubin Total: 0.2 mg/dL (ref 0.0–1.2)
CO2: 22 mmol/L (ref 20–29)
Calcium: 10.5 mg/dL — ABNORMAL HIGH (ref 8.7–10.2)
Chloride: 100 mmol/L (ref 96–106)
Creatinine, Ser: 0.94 mg/dL (ref 0.57–1.00)
Globulin, Total: 2.3 g/dL (ref 1.5–4.5)
Glucose: 103 mg/dL — ABNORMAL HIGH (ref 65–99)
Potassium: 3.8 mmol/L (ref 3.5–5.2)
Sodium: 140 mmol/L (ref 134–144)
Total Protein: 6.9 g/dL (ref 6.0–8.5)
eGFR: 70 mL/min/{1.73_m2} (ref >59)

## 2021-02-12 LAB — CBC
Hematocrit: 38.6 % (ref 34.0–46.6)
Hemoglobin: 13.6 g/dL (ref 11.1–15.9)
MCH: 29.7 pg (ref 26.6–33.0)
MCHC: 35.2 g/dL (ref 31.5–35.7)
MCV: 84 fL (ref 79–97)
Platelets: 288 10*3/uL (ref 150–450)
RBC: 4.58 x10E6/uL (ref 3.77–5.28)
RDW: 13.7 % (ref 11.7–15.4)
WBC: 9.8 10*3/uL (ref 3.4–10.8)

## 2021-02-12 LAB — HEMOGLOBIN A1C
Est. average glucose Bld gHb Est-mCnc: 131 mg/dL
Hgb A1c MFr Bld: 6.2 % — ABNORMAL HIGH (ref 4.8–5.6)

## 2021-02-12 LAB — VITAMIN D 25 HYDROXY (VIT D DEFICIENCY, FRACTURES): Vit D, 25-Hydroxy: 36.2 ng/mL (ref 30.0–100.0)

## 2021-02-14 ENCOUNTER — Ambulatory Visit: Payer: BC Managed Care – PPO | Admitting: Cardiology

## 2021-04-14 ENCOUNTER — Other Ambulatory Visit: Payer: Self-pay | Admitting: Nurse Practitioner

## 2021-04-14 DIAGNOSIS — R252 Cramp and spasm: Secondary | ICD-10-CM

## 2021-04-30 ENCOUNTER — Other Ambulatory Visit: Payer: Self-pay | Admitting: Nurse Practitioner

## 2021-04-30 DIAGNOSIS — I10 Essential (primary) hypertension: Secondary | ICD-10-CM

## 2021-08-04 ENCOUNTER — Other Ambulatory Visit: Payer: Self-pay | Admitting: Cardiology

## 2021-08-05 ENCOUNTER — Other Ambulatory Visit: Payer: Self-pay

## 2021-08-05 MED ORDER — SPIRONOLACTONE 25 MG PO TABS: 25.0000 mg | ORAL_TABLET | Freq: Every day | ORAL | 3 refills | Status: DC

## 2021-09-05 ENCOUNTER — Encounter: Payer: Self-pay | Admitting: Nurse Practitioner

## 2021-09-15 ENCOUNTER — Encounter: Payer: Self-pay | Admitting: Nurse Practitioner

## 2021-09-15 ENCOUNTER — Other Ambulatory Visit: Payer: Self-pay

## 2021-09-15 ENCOUNTER — Ambulatory Visit (INDEPENDENT_AMBULATORY_CARE_PROVIDER_SITE_OTHER): Payer: BC Managed Care – PPO | Admitting: Nurse Practitioner

## 2021-09-15 VITALS — BP 118/60 | HR 83 | Temp 98.3°F | Ht 62.8 in | Wt 169.0 lb

## 2021-09-15 DIAGNOSIS — Z6830 Body mass index (BMI) 30.0-30.9, adult: Secondary | ICD-10-CM

## 2021-09-15 DIAGNOSIS — U071 COVID-19: Secondary | ICD-10-CM | POA: Diagnosis not present

## 2021-09-15 DIAGNOSIS — I700 Atherosclerosis of aorta: Secondary | ICD-10-CM | POA: Diagnosis not present

## 2021-09-15 DIAGNOSIS — E6609 Other obesity due to excess calories: Secondary | ICD-10-CM | POA: Diagnosis not present

## 2021-09-15 DIAGNOSIS — J1282 Pneumonia due to coronavirus disease 2019: Secondary | ICD-10-CM | POA: Diagnosis not present

## 2021-09-15 MED ORDER — ROSUVASTATIN CALCIUM 10 MG PO TABS: 10.0000 mg | ORAL_TABLET | Freq: Every day | ORAL | 1 refills | Status: DC

## 2021-09-15 NOTE — Patient Instructions (Signed)
Pneumonitis ?Pneumonitis is an inflammation of the lungs. This condition can be caused by an infection or exposure to certain substances. It can also be caused by things you are allergic to (allergens). ?What are the causes? ?This condition may be caused by: ?An infection from bacteria or a virus (pneumonia). ?Exposure to certain substances in the workplace. These workplaces include farms and certain industries. Some substances that can cause this condition include asbestos, silica, inhaled acids, or inhaled chlorine gas. ?Repeated exposure to bird feathers, bird feces, or other allergens. ?Medicines such as chemotherapy drugs, certain antibiotic medicines, and some heart medicines. ?Radiation therapy. ?Exposure to mold. A hot tub, sauna, or home humidifier can have mold growing in it, even if it looks clean. You can breathe in the mold through water vapor. ?Breathing stomach contents, food, or liquids into the lungs. ?What are the signs or symptoms? ?Symptoms of this condition include: ?Shortness of breath or trouble breathing. This is the most common symptom. ?Cough. ?Fever. ?Chest tightness. ?Decreased energy. ?Decreased appetite. ?How is this diagnosed? ?This condition may be diagnosed based on: ?Your medical history. ?A physical exam. ?Blood tests. ?Other tests, including: ?A pulmonary function test (PFT). This measures how well your lungs work. ?A chest X-ray. ?A CT scan of the lungs. ?A bronchoscopy. In this procedure, your health care provider looks at your airways through an instrument called a bronchoscope. ?A lung biopsy. In this procedure, your health care provider takes a small piece of tissue from your lungs to examine it. ?How is this treated? ?Treatment depends on the cause of the condition. If the cause is exposure to a substance, avoiding further exposure to that substance will help reduce your symptoms. If needed, treatment for pneumonitis may include: ?Corticosteroid medicine to help decrease  inflammation. ?Antibiotic medicine to help fight an infection caused by bacteria. ?Medicines that help relax the muscles and make breathing easier, such as bronchodilators or inhalers. ?Oxygen therapy, if you are having trouble breathing. ?Follow these instructions at home: ?  ?Medicines ?Take over-the-counter and prescription medicines only as told by your health care provider. This includes using any inhalers only as told by your health care provider. ?If you were prescribed an antibiotic medicine, take it as told by your health care provider. Do not stop using the antibiotic even if you start to feel better. ?If you were prescribed an inhaler, keep it with you at all times. ?General instructions ?Avoid exposure to any substance that caused your pneumonitis. If you need to work with substances that can cause pneumonitis, use an air purifier while working and wear a mask to protect your lungs. ?Do not use any products that contain nicotine or tobacco, such as cigarettes, e-cigarettes, and chewing tobacco. If you need help quitting, ask your health care provider. ?Keep all follow-up visits as told by your health care provider. This is important. ?Contact a health care provider if: ?You have a fever. ?Your symptoms get worse. ?Get help right away if: ?You have new or worse shortness of breath. ?You develop a blue color under your fingernails. ?Summary ?Pneumonitis is an inflammation of the lungs. This condition can be caused by infection or exposure to certain substances or allergens. ?The most common symptom of this condition is shortness of breath or trouble breathing. ?Treatment depends on the cause of your condition. ?This information is not intended to replace advice given to you by your health care provider. Make sure you discuss any questions you have with your health care provider. ?Document Revised:  05/30/2019 Document Reviewed: 05/30/2019 ?Elsevier Patient Education ? [place]. ? ?

## 2021-09-15 NOTE — Progress Notes (Signed)
?Industrial/product designer as a Education administrator for [employer], FNP.,have documented all relevant documentation on the behalf of [name], FNP,as directed by  [name], FNP while in the presence of [name], [place]. ? ?This visit occurred during the SARS-CoV-2 public health emergency.  Safety protocols were in place, including screening questions prior to the visit, additional usage of staff PPE, and extensive cleaning of exam room while observing appropriate contact time as indicated for disinfecting solutions. ? ?Subjective:  ?  ? Patient ID: Evelyn [name] , female    DOB: 1961-02-11 , 61 y.o.   MRN: [id number] ? ? ?Chief Complaint  ?Patient presents with  ? Follow-up  ? ? ?HPI ? ?Patient is here for Urgent care at [facility] follow up for pneumonia on Thursday, she is on day 5. She had scratchy throat, increased coughing. When she was diagnosed with covid the second time she continued to have symptoms in January, she took Paxlovid until completely gone. She was diagnosed with post covid bronchitis was treated with steroids. This time her scratching throat was worse and coughing - feels related to post covid pneumonia. She is taking steroid dose pack x 12 day, promethazine syrup, levofloxacin x 10 days. She has another in the evening. She was out of work on Thursday/Friday and returned today.  ?  ? ?Past Medical History:  ?Diagnosis Date  ? Anxiety   ? Arthritis   ? Blood transfusion without reported diagnosis   ? Calcification of abdominal aorta (HCC) 11/11/2017  ? Hyperlipidemia   ? Hypertension   ?  ? ?Family History  ?Problem Relation Age of Onset  ? Diabetes Mother   ? COPD Mother   ? Renal Disease Mother   ? Heart attack Father 31  ? Stroke Brother   ? Colon cancer Neg Hx   ? Esophageal cancer Neg Hx   ? Liver cancer Neg Hx   ? Pancreatic cancer Neg Hx   ? Rectal cancer Neg Hx   ? Stomach cancer Neg Hx   ? ? ? ?Current Outpatient Medications:  ?  amLODipine (NORVASC) 10 MG tablet, TAKE 1 TABLET BY MOUTH EVERY  DAY, Disp: 90 tablet, Rfl: 2 ?  aspirin 81 MG chewable tablet, Chew 81 mg by mouth daily., Disp: , Rfl:  ?  Calcium Polycarbophil (FIBER) 625 MG TABS, Take by mouth., Disp: , Rfl:  ?  cholecalciferol (VITAMIN D3) 25 MCG (1000 UT) tablet, Take 1,000 Units by mouth daily., Disp: , Rfl:  ?  CVS MAGNESIUM OXIDE 250 MG TABS, TAKE 1 TABLET (250 MG TOTAL) BY MOUTH EVERY EVENING. TAKE WITH EVENING MEAL, Disp: 100 tablet, Rfl: 1 ?  cycloSPORINE (RESTASIS) 0.05 % ophthalmic emulsion, Place 1 drop into both eyes 2 (two) times daily., Disp: 0.4 mL, Rfl: 0 ?  diclofenac Sodium (VOLTAREN) 1 % GEL, Apply 2 g topically 4 (four) times daily. (Patient taking differently: Apply 2 g topically 4 (four) times daily as needed (pain).), Disp: 100 g, Rfl: 2 ?  losartan-hydrochlorothiazide (HYZAAR) 100-25 MG tablet, TAKE 1 TABLET BY MOUTH EVERY DAY, Disp: 90 tablet, Rfl: 1 ?  meloxicam (MOBIC) 15 MG tablet, TAKE 1 TABLET BY MOUTH ONCE A DAY WITH FOOD FOR INFLAMMATION, Disp: , Rfl:  ?  Multiple Vitamins-Minerals (MULTIVITAMIN WOMENS 50+ ADV PO), Take 1 tablet by mouth daily., Disp: , Rfl:  ?  rosuvastatin (CRESTOR) 10 MG tablet, TAKE 1 TABLET BY MOUTH EVERY DAY, Disp: 90 tablet, Rfl: 1 ?  spironolactone (ALDACTONE) 25 MG tablet, Take  1 tablet (25 mg total) by mouth daily., Disp: 90 tablet, Rfl: 3 ?  escitalopram (LEXAPRO) 20 MG tablet, Take 1 tablet (20 mg total) by mouth daily. (Patient not taking: Reported on 09/15/2021), Disp: 30 tablet, Rfl: 0 ?  levofloxacin (LEVAQUIN) 500 MG tablet, Take 500 mg by mouth daily., Disp: , Rfl:  ?  predniSONE (STERAPRED UNI-PAK 48 TAB) 10 MG (48) TBPK tablet, Take 1 tablet by mouth as directed., Disp: , Rfl:  ?  promethazine-dextromethorphan (PROMETHAZINE-DM) 6.25-15 MG/5ML syrup, Take 5 mLs by mouth every 4 (four) hours as needed., Disp: , Rfl:   ? ?Allergies  ?Allergen Reactions  ? Lisinopril Other (See Comments)  ?  Intolerance cough ?  ?  ? ?Review of Systems  ?Constitutional: Negative.   ?Respiratory:   Positive for cough (small amount of phlegm this morning). Negative for shortness of breath.   ?Cardiovascular: Negative.   ?Gastrointestinal: Negative.   ?Neurological: Negative.   ?Psychiatric/Behavioral: Negative.     ? ?Today's Vitals  ? 09/15/21 1038  ?BP: 118/60  ?Pulse: 83  ?Temp: 98.3 ?F (36.8 ?C)  ?TempSrc: Oral  ?Weight: 169 lb (76.7 kg)  ?Height: 5' 2.8" (1.595 m)  ? ?Body mass index is 30.13 kg/m?.  ? ?Objective:  ?Physical Exam ?Vitals reviewed.  ?Constitutional:   ?   General: She is not in acute distress. ?   Appearance: Normal appearance. She is well-developed. She is obese.  ?Cardiovascular:  ?   Rate and Rhythm: Normal rate and regular rhythm.  ?   Pulses: Normal pulses.  ?   Heart sounds: Normal heart sounds. No murmur heard. ?Pulmonary:  ?   Effort: Pulmonary effort is normal. No respiratory distress.  ?   Breath sounds: Normal breath sounds. No wheezing.  ?Skin: ?   General: Skin is warm and dry.  ?   Capillary Refill: Capillary refill takes less than 2 seconds.  ?Neurological:  ?   General: No focal deficit present.  ?   Mental Status: She is alert and oriented to person, place, and time.  ?   Cranial Nerves: No cranial nerve deficit.  ?   Motor: No weakness.  ?Psychiatric:     ?   Mood and Affect: Mood normal.     ?   Thought Content: Thought content normal.     ?   Judgment: Judgment normal.  ?  ? ?   ?Assessment And Plan:  ?   ?1. Pneumonia due to COVID-19 virus ?Comments: Normal physical exam today, will do a 4 week f/u CXR she is advised to walk in to [place]. Will get records from [facility] to complete her forms ?- DG Chest 2 View; Future ? ?2. Class 1 obesity due to excess calories with serious comorbidity and body mass index (BMI) of 30.0 to 30.9 in adult ?She is encouraged to strive for BMI less than 30 to decrease cardiac risk. Advised to aim for at least 150 minutes of exercise per week. ? ?3. Aortic atherosclerosis ([place]) ?Continue statin, tolerating well.  ? ? ? ?Patient  was given opportunity to ask questions. Patient verbalized understanding of the plan and was able to repeat key elements of the plan. All questions were answered to their satisfaction.  ?[name], FNP  ? ?I, [name], FNP, have reviewed all documentation for this visit. The documentation on 09/15/21 for the exam, diagnosis, procedures, and orders are all accurate and complete.  ? ?IF YOU HAVE BEEN REFERRED TO A SPECIALIST,  IT MAY TAKE 1-2 WEEKS TO SCHEDULE/PROCESS THE REFERRAL. IF YOU HAVE NOT HEARD FROM US/SPECIALIST IN TWO WEEKS, PLEASE GIVE [place] A CALL AT [phone].  ? ?THE PATIENT IS ENCOURAGED TO PRACTICE SOCIAL DISTANCING DUE TO THE COVID-19 PANDEMIC.   ?

## 2021-09-26 ENCOUNTER — Ambulatory Visit
Admission: RE | Admit: 2021-09-26 | Discharge: 2021-09-26 | Disposition: A | Discharge status: Home / Self Care | Payer: BC Managed Care – PPO | Source: Ambulatory Visit | Attending: Nurse Practitioner | Admitting: Nurse Practitioner

## 2021-09-26 DIAGNOSIS — U071 COVID-19: Secondary | ICD-10-CM

## 2021-10-09 ENCOUNTER — Ambulatory Visit
Admission: RE | Admit: 2021-10-09 | Discharge: 2021-10-09 | Disposition: A | Discharge status: Home / Self Care | Payer: BC Managed Care – PPO | Source: Ambulatory Visit | Attending: Nurse Practitioner | Admitting: Nurse Practitioner

## 2021-10-09 DIAGNOSIS — J1282 Pneumonia due to coronavirus disease 2019: Secondary | ICD-10-CM

## 2021-10-25 IMAGING — CR DG ABDOMEN 1V
1 series · 1 of 1 positions shown · non-contrast
Comparison: 09/21/2006

CLINICAL DATA: Abdominal distension

EXAM:
ABDOMEN - 1 VIEW

[t abdomen supine]
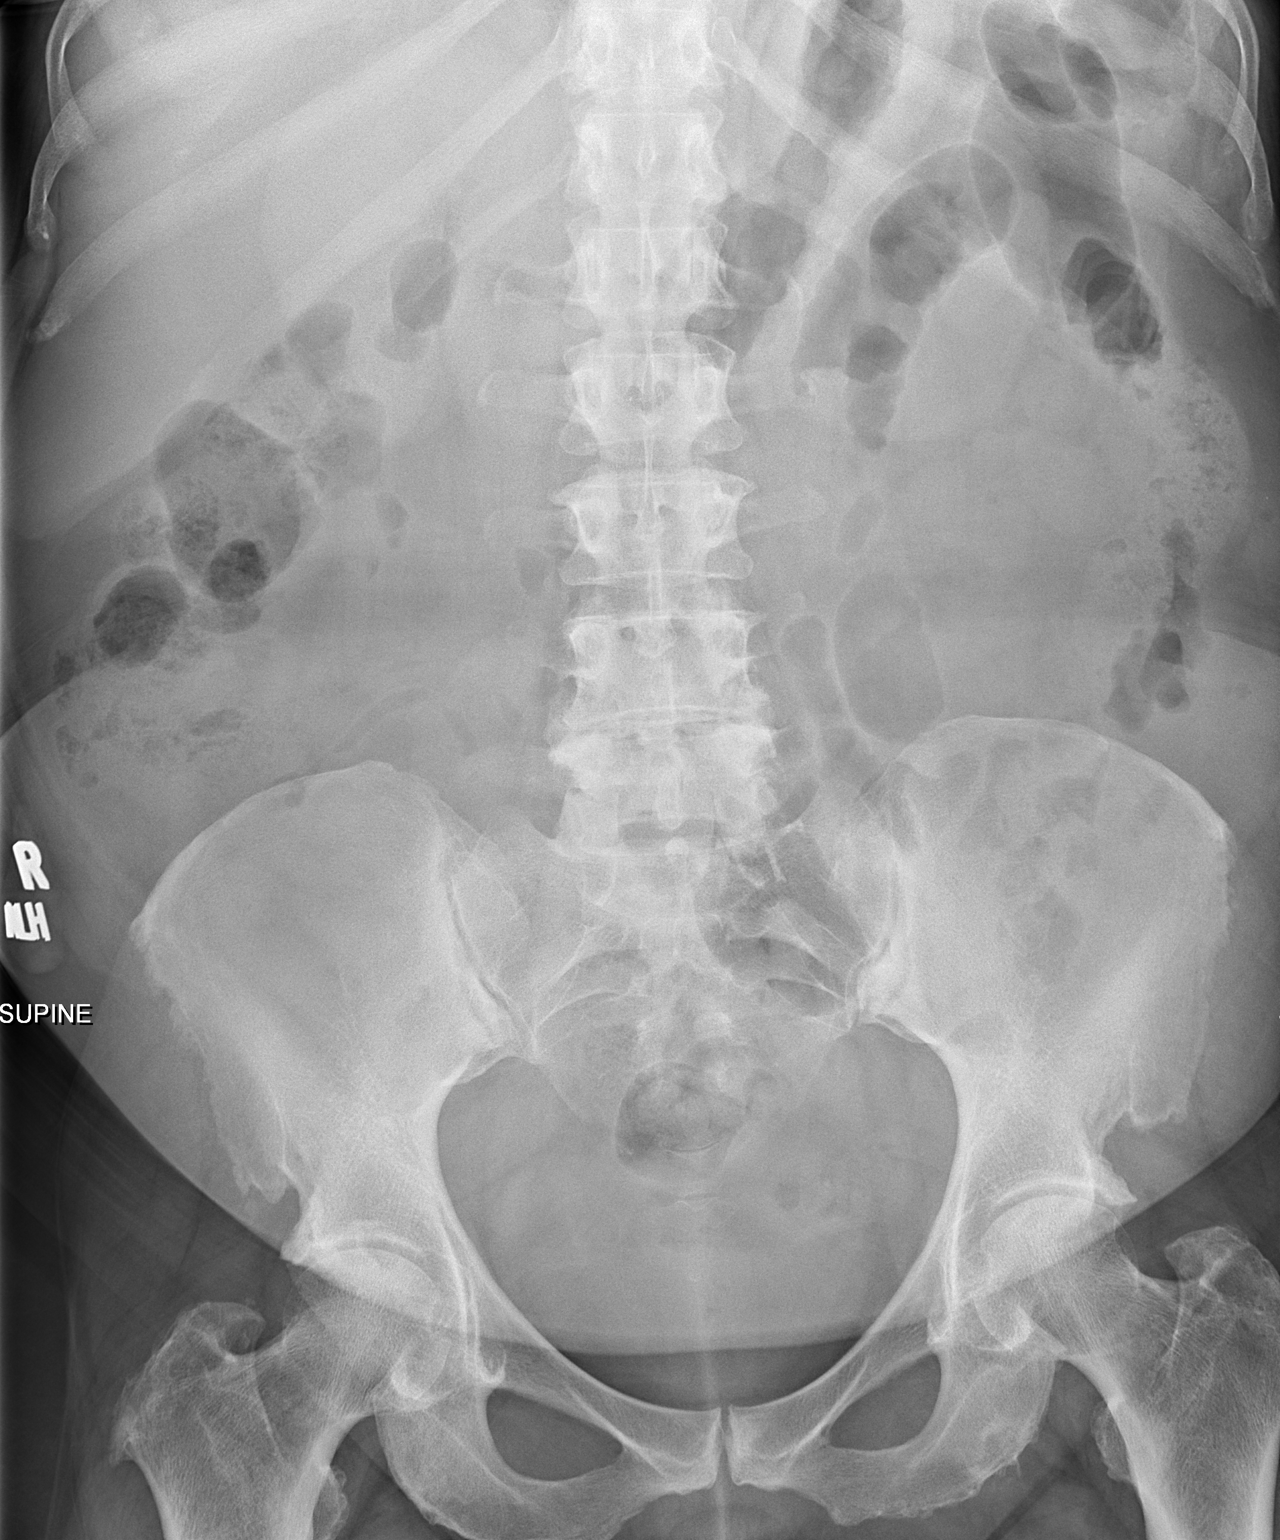

[1 of 1 positions shown; findings below may reference images not displayed]

FINDINGS: The bowel gas pattern is normal. No radio-opaque calculi or other
significant radiographic abnormality are seen.
IMPRESSION: Negative.

## 2021-10-29 ENCOUNTER — Ambulatory Visit (INDEPENDENT_AMBULATORY_CARE_PROVIDER_SITE_OTHER): Payer: BC Managed Care – PPO | Admitting: Nurse Practitioner

## 2021-10-29 ENCOUNTER — Encounter: Payer: Self-pay | Admitting: Nurse Practitioner

## 2021-10-29 ENCOUNTER — Other Ambulatory Visit: Payer: Self-pay | Admitting: Nurse Practitioner

## 2021-10-29 VITALS — Temp 99.1°F | Ht 62.8 in | Wt 169.0 lb

## 2021-10-29 DIAGNOSIS — I10 Essential (primary) hypertension: Secondary | ICD-10-CM

## 2021-10-29 DIAGNOSIS — R051 Acute cough: Secondary | ICD-10-CM

## 2021-10-29 DIAGNOSIS — I700 Atherosclerosis of aorta: Secondary | ICD-10-CM

## 2021-10-29 DIAGNOSIS — R42 Dizziness and giddiness: Secondary | ICD-10-CM

## 2021-10-29 DIAGNOSIS — Z8701 Personal history of pneumonia (recurrent): Secondary | ICD-10-CM | POA: Diagnosis not present

## 2021-10-29 LAB — POC INFLUENZA A&B (BINAX/QUICKVUE)
Influenza A, POC: NEGATIVE
Influenza B, POC: NEGATIVE

## 2021-10-29 LAB — POC COVID19 BINAXNOW: SARS Coronavirus 2 Ag: NEGATIVE

## 2021-10-29 MED ORDER — IPRATROPIUM BROMIDE 0.06 % NA SOLN: 2.0000 | Freq: Three times a day (TID) | NASAL | 2 refills | Status: DC

## 2021-10-29 MED ORDER — PROMETHAZINE-DM 6.25-15 MG/5ML PO SYRP: 5.0000 mL | ORAL_SOLUTION | ORAL | 1 refills | Status: DC | PRN

## 2021-10-29 NOTE — Progress Notes (Signed)
I,Evelyn [name],acting as a Education administrator for [employer], FNP.,have documented all relevant documentation on the behalf of Evelyn Brine, FNP,as directed by  Evelyn Brine, FNP while in the presence of Evelyn [name], Evelyn [name].  This visit occurred during the SARS-CoV-2 public health emergency.  Safety protocols were in place, including screening questions prior to the visit, additional usage of staff PPE, and extensive cleaning of exam room while observing appropriate contact time as indicated for disinfecting solutions.  Subjective:     Patient ID: Evelyn [name] , female    DOB: [birth year] , 61 y.o.   MRN: [id number]   Chief Complaint  Patient presents with   Cough    HPI  Patient presents today for cough and dizziness. She will have a cough so bad she will have dizziness. Sometimes when just standing and will have dizziness. She has been using the albuterol inhaler as needed but has been using sometimes 3 times a day.  She has been treated with prednisone a couple times.   Cough This is a recurrent problem. The current episode started more than 1 month ago. Pertinent negatives include no chest pain, headaches, nasal congestion, shortness of breath or sweats. Treatments tried: albuterol inhaler.    Past Medical History:  Diagnosis Date   Anxiety    Arthritis    Blood transfusion without reported diagnosis    Calcification of abdominal aorta ([place]) 11/11/2017   Hyperlipidemia    Hypertension      Family History  Problem Relation Age of Onset   Diabetes Mother    COPD Mother    Renal Disease Mother    Heart attack Father 86   Stroke Brother    Colon cancer Neg Hx    Esophageal cancer Neg Hx    Liver cancer Neg Hx    Pancreatic cancer Neg Hx    Rectal cancer Neg Hx    Stomach cancer Neg Hx      Current Outpatient Medications:    amLODipine (NORVASC) 10 MG tablet, TAKE 1 TABLET BY MOUTH EVERY DAY, Disp: 90 tablet, Rfl: 2   aspirin 81 MG chewable tablet, Chew 81 mg by mouth daily.,  Disp: , Rfl:    Calcium Polycarbophil (FIBER) 625 MG TABS, Take by mouth., Disp: , Rfl:    cholecalciferol (VITAMIN D3) 25 MCG (1000 UT) tablet, Take 1,000 Units by mouth daily., Disp: , Rfl:    CVS MAGNESIUM OXIDE 250 MG TABS, TAKE 1 TABLET (250 MG TOTAL) BY MOUTH EVERY EVENING. TAKE WITH EVENING MEAL, Disp: 100 tablet, Rfl: 1   cycloSPORINE (RESTASIS) 0.05 % ophthalmic emulsion, Place 1 drop into both eyes 2 (two) times daily., Disp: 0.4 mL, Rfl: 0   diclofenac Sodium (VOLTAREN) 1 % GEL, Apply 2 g topically 4 (four) times daily. (Patient taking differently: Apply 2 g topically 4 (four) times daily as needed (pain).), Disp: 100 g, Rfl: 2   escitalopram (LEXAPRO) 20 MG tablet, Take 1 tablet (20 mg total) by mouth daily., Disp: 30 tablet, Rfl: 0   losartan-hydrochlorothiazide (HYZAAR) 100-25 MG tablet, TAKE 1 TABLET BY MOUTH EVERY DAY, Disp: 90 tablet, Rfl: 1   meloxicam (MOBIC) 15 MG tablet, TAKE 1 TABLET BY MOUTH ONCE A DAY WITH FOOD FOR INFLAMMATION, Disp: , Rfl:    Multiple Vitamins-Minerals (MULTIVITAMIN WOMENS 50+ ADV PO), Take 1 tablet by mouth daily., Disp: , Rfl:    rosuvastatin (CRESTOR) 10 MG tablet, Take 1 tablet (10 mg total) by mouth daily., Disp: 90 tablet, Rfl: 1  spironolactone (ALDACTONE) 25 MG tablet, Take 1 tablet (25 mg total) by mouth daily., Disp: 90 tablet, Rfl: 3   albuterol (VENTOLIN HFA) 108 (90 Base) MCG/ACT inhaler, SMARTSIG:2 Puff(s) By Mouth 4 Times Daily PRN, Disp: , Rfl:    budesonide-formoterol (SYMBICORT) 160-4.5 MCG/ACT inhaler, Inhale 2 puffs into the lungs in the morning and at bedtime., Disp: 10.2 g, Rfl: 6   ipratropium (ATROVENT) 0.06 % nasal spray, PLACE 2 SPRAYS INTO THE NOSE 3 (THREE) TIMES DAILY., Disp: 45 mL, Rfl: 1   predniSONE (DELTASONE) 20 MG tablet, Take 1m for 5 days, Disp: 10 tablet, Rfl: 0   promethazine-dextromethorphan (PROMETHAZINE-DM) 6.25-15 MG/5ML syrup, Take 5 mLs by mouth every 4 (four) hours as needed. (Patient not taking: Reported on  11/12/2021), Disp: 118 mL, Rfl: 1   Allergies  Allergen Reactions   Lisinopril Other (See Comments)    Intolerance cough      Review of Systems  Constitutional: Negative.   Respiratory:  Positive for cough. Negative for shortness of breath.   Cardiovascular: Negative.  Negative for chest pain.  Gastrointestinal: Negative.   Neurological:  Positive for dizziness. Negative for headaches.    Today's Vitals   10/29/21 1122  Temp: 99.1 F (37.3 C)  TempSrc: Oral  Weight: 169 lb (76.7 kg)  Height: 5' 2.8" (1.595 m)   Body mass index is 30.13 kg/m.   Objective:  Physical Exam Vitals reviewed.  Constitutional:      General: She is not in acute distress.    Appearance: Normal appearance. She is well-developed. She is obese.  HENT:     Head: Normocephalic and atraumatic.     Nose: Nose normal.     Mouth/Throat:     Mouth: Mucous membranes are moist.  Eyes:     Pupils: Pupils are equal, round, and reactive to light.  Cardiovascular:     Rate and Rhythm: Normal rate and regular rhythm.     Pulses: Normal pulses.     Heart sounds: Normal heart sounds. No murmur heard. Pulmonary:     Effort: Pulmonary effort is normal.     Breath sounds: Normal breath sounds.  Musculoskeletal:     Cervical back: Normal range of motion and neck supple.  Skin:    General: Skin is warm and dry.     Capillary Refill: Capillary refill takes less than 2 seconds.  Neurological:     General: No focal deficit present.     Mental Status: She is alert and oriented to person, place, and time.     Cranial Nerves: No cranial nerve deficit.     Motor: No weakness.  Psychiatric:        Mood and Affect: Mood normal.        Behavior: Behavior normal.        Thought Content: Thought content normal.        Judgment: Judgment normal.        Assessment And Plan:     1. Acute cough Comments: Negative rabpid covid, Influenza A/B, dry hacking cough will treat with atrovent nasal spray - POC COVID-19 -  POC Influenza A&B (Binax test)  2. Dizziness Comments: Orthostats are normal encouraged to stay well hydrated with water.  - CBC - TSH - BMP8+eGFR  3. History of pneumonia  4. Aortic atherosclerosis (HCC) Comments: Continue statin     Patient was given opportunity to ask questions. Patient verbalized understanding of the plan and was able to repeat key elements of the plan. All  questions were answered to their satisfaction.  Evelyn Brine, FNP   I, Evelyn Brine, FNP, have reviewed all documentation for this visit. The documentation on 10/29/21 for the exam, diagnosis, procedures, and orders are all accurate and complete.   IF YOU HAVE BEEN REFERRED TO A SPECIALIST, IT MAY TAKE 1-2 WEEKS TO SCHEDULE/PROCESS THE REFERRAL. IF YOU HAVE NOT HEARD FROM US/SPECIALIST IN TWO WEEKS, PLEASE GIVE [place] A CALL AT [phone].   THE PATIENT IS ENCOURAGED TO PRACTICE SOCIAL DISTANCING DUE TO THE COVID-19 PANDEMIC.

## 2021-10-29 NOTE — Patient Instructions (Signed)

## 2021-10-30 LAB — CBC
Hematocrit: 40.6 % (ref 34.0–46.6)
Hemoglobin: 13.7 g/dL (ref 11.1–15.9)
MCH: 28.3 pg (ref 26.6–33.0)
MCHC: 33.7 g/dL (ref 31.5–35.7)
MCV: 84 fL (ref 79–97)
Platelets: 260 10*3/uL (ref 150–450)
RBC: 4.84 x10E6/uL (ref 3.77–5.28)
RDW: 15.7 % — ABNORMAL HIGH (ref 11.7–15.4)
WBC: 7.8 10*3/uL (ref 3.4–10.8)

## 2021-10-30 LAB — BMP8+EGFR
BUN/Creatinine Ratio: 19 (ref 9–23)
BUN: 18 mg/dL (ref 6–24)
CO2: 22 mmol/L (ref 20–29)
Calcium: 9.7 mg/dL (ref 8.7–10.2)
Chloride: 100 mmol/L (ref 96–106)
Creatinine, Ser: 0.95 mg/dL (ref 0.57–1.00)
Glucose: 105 mg/dL — ABNORMAL HIGH (ref 70–99)
Potassium: 4.8 mmol/L (ref 3.5–5.2)
Sodium: 139 mmol/L (ref 134–144)
eGFR: 69 mL/min/{1.73_m2} (ref >59)

## 2021-10-30 LAB — TSH: TSH: 0.656 u[IU]/mL (ref 0.450–4.500)

## 2021-11-05 ENCOUNTER — Other Ambulatory Visit: Payer: Self-pay | Admitting: Nurse Practitioner

## 2021-11-05 DIAGNOSIS — R051 Acute cough: Secondary | ICD-10-CM

## 2021-11-05 DIAGNOSIS — Z8701 Personal history of pneumonia (recurrent): Secondary | ICD-10-CM

## 2021-11-11 ENCOUNTER — Other Ambulatory Visit: Payer: Self-pay | Admitting: Nurse Practitioner

## 2021-11-11 DIAGNOSIS — R051 Acute cough: Secondary | ICD-10-CM

## 2021-11-11 DIAGNOSIS — Z8701 Personal history of pneumonia (recurrent): Secondary | ICD-10-CM

## 2021-11-12 ENCOUNTER — Encounter: Payer: Self-pay | Admitting: Pulmonary Disease

## 2021-11-12 ENCOUNTER — Ambulatory Visit (INDEPENDENT_AMBULATORY_CARE_PROVIDER_SITE_OTHER): Payer: BC Managed Care – PPO | Admitting: Pulmonary Disease

## 2021-11-12 VITALS — BP 110/60 | HR 81 | Temp 98.3°F | Ht 63.0 in | Wt 164.0 lb

## 2021-11-12 DIAGNOSIS — R059 Cough, unspecified: Secondary | ICD-10-CM | POA: Diagnosis not present

## 2021-11-12 MED ORDER — BUDESONIDE-FORMOTEROL FUMARATE 160-4.5 MCG/ACT IN AERO: 2.0000 | INHALATION_SPRAY | Freq: Two times a day (BID) | RESPIRATORY_TRACT | 6 refills | Status: DC

## 2021-11-12 MED ORDER — PREDNISONE 20 MG PO TABS: ORAL_TABLET | ORAL | 0 refills | Status: DC

## 2021-11-12 NOTE — Patient Instructions (Addendum)
Will start prednisone 40 mg a day for five days start Symbicort 160/4.5 get some labs today including CBC differential, IGE  Schedule PFTs and follow-up in clinic after these tests in one to three months

## 2021-11-12 NOTE — Progress Notes (Signed)
Evelyn [name]    [id number]    Jun 01, 1961  Primary Care Physician:Moore, Doreene Burke, [place]  Referring Physician: [name], [address],  [place] [address]  Chief complaint: Consult for cough, post COVID 34  HPI: 61 year old with history of hypertension, hyperlipidemia. Develop COVID-19 in January 2023. She did not require hospitalization. However develop chronic cough and congestion since then requiring several rounds of prednisone. She went to urgent care in April and was diagnosed with pneumonia. I don't have the chest x-ray for review. A follow-up chest x-ray at the end of April by primary care shows clear lungs with no infiltrate  Chief complaint today is chronic cough which is nonproductive in nature, dyspnea with wheezing. Symptoms exacerbated at night when lying down. She denies any acid reflux but notes seasonal allergies. Symptoms usually get better when she is on short prednisone tables  Pets: no pets Occupation: Glass blower/designer at [employer] Exposures: no mold, hot tub, [place]. No further pillars or comforters. Smoking history: 13 pack year smoker. Continues to smoke half pack per day Travel history: no significant travel history Relevant family history: mom died of lung cancer. She was a smoker   Outpatient Encounter Medications as of 11/12/2021  Medication Sig   albuterol (VENTOLIN HFA) 108 (90 Base) MCG/ACT inhaler SMARTSIG:2 Puff(s) By Mouth 4 Times Daily PRN   amLODipine (NORVASC) 10 MG tablet TAKE 1 TABLET BY MOUTH EVERY DAY   aspirin 81 MG chewable tablet Chew 81 mg by mouth daily.   Calcium Polycarbophil (FIBER) 625 MG TABS Take by mouth.   cholecalciferol (VITAMIN D3) 25 MCG (1000 UT) tablet Take 1,000 Units by mouth daily.   CVS MAGNESIUM OXIDE 250 MG TABS TAKE 1 TABLET (250 MG TOTAL) BY MOUTH EVERY EVENING. TAKE WITH EVENING MEAL   cycloSPORINE (RESTASIS) 0.05 % ophthalmic emulsion Place 1 drop into both eyes 2 (two)  times daily.   diclofenac Sodium (VOLTAREN) 1 % GEL Apply 2 g topically 4 (four) times daily. (Patient taking differently: Apply 2 g topically 4 (four) times daily as needed (pain).)   ipratropium (ATROVENT) 0.06 % nasal spray PLACE 2 SPRAYS INTO THE NOSE 3 (THREE) TIMES DAILY.   losartan-hydrochlorothiazide (HYZAAR) 100-25 MG tablet TAKE 1 TABLET BY MOUTH EVERY DAY   meloxicam (MOBIC) 15 MG tablet TAKE 1 TABLET BY MOUTH ONCE A DAY WITH FOOD FOR INFLAMMATION   Multiple Vitamins-Minerals (MULTIVITAMIN WOMENS 50+ ADV PO) Take 1 tablet by mouth daily.   rosuvastatin (CRESTOR) 10 MG tablet Take 1 tablet (10 mg total) by mouth daily.   spironolactone (ALDACTONE) 25 MG tablet Take 1 tablet (25 mg total) by mouth daily.   escitalopram (LEXAPRO) 20 MG tablet Take 1 tablet (20 mg total) by mouth daily.   promethazine-dextromethorphan (PROMETHAZINE-DM) 6.25-15 MG/5ML syrup Take 5 mLs by mouth every 4 (four) hours as needed. (Patient not taking: Reported on 11/12/2021)   No facility-administered encounter medications on file as of 11/12/2021.    Allergies as of 11/12/2021 - Review Complete 11/12/2021  Allergen Reaction Noted   Lisinopril Other (See Comments) 02/02/2018    Past Medical History:  Diagnosis Date   Anxiety    Arthritis    Blood transfusion without reported diagnosis    Calcification of abdominal aorta ([place]) 11/11/2017   Hyperlipidemia    Hypertension     Past Surgical History:  Procedure Laterality Date   CESAREAN SECTION     x2   FOOT  SURGERY Left    [place] Toe   TUBAL LIGATION      Family History  Problem Relation Age of Onset   Diabetes Mother    COPD Mother    Renal Disease Mother    Heart attack Father 4   Stroke Brother    Colon cancer Neg Hx    Esophageal cancer Neg Hx    Liver cancer Neg Hx    Pancreatic cancer Neg Hx    Rectal cancer Neg Hx    Stomach cancer Neg Hx     Social History   Socioeconomic History   Marital status: Married    Spouse name:  Not on file   Number of children: 2   Years of education: Not on file   Highest education level: Not on file  Occupational History   Occupation: Glass blower/designer  Tobacco Use   Smoking status: Every Day    Packs/day: 0.50    Years: 38.00    Pack years: 19.00    Types: Cigarettes   Smokeless tobacco: Never   Tobacco comments:    she is down to 1 cigarette per week/lt 11/12/21  Vaping Use   Vaping Use: Never used  Substance and Sexual Activity   Alcohol use: Yes    Alcohol/week: 2.0 standard drinks    Types: 2 Cans of beer per week    Comment: Beer 2 daily   Drug use: Yes    Types: Marijuana    Comment: Episodic use of marijuana   Sexual activity: Yes  Other Topics Concern   Not on file  Social History Narrative   Pt lives in [place] with husband, son, nephew, and nephew's girlfriend   Social Determinants of Health   Financial Resource Strain: Not on file  Food Insecurity: Not on file  Transportation Needs: Not on file  Physical Activity: Not on file  Stress: Not on file  Social Connections: Not on file  Intimate Partner Violence: Not on file    Review of systems: Review of Systems  Constitutional: Negative for fever and chills.  HENT: Negative.   Eyes: Negative for blurred vision.  Respiratory: as per HPI  Cardiovascular: Negative for chest pain and palpitations.  Gastrointestinal: Negative for vomiting, diarrhea, blood per rectum. Genitourinary: Negative for dysuria, urgency, frequency and hematuria.  Musculoskeletal: Negative for myalgias, back pain and joint pain.  Skin: Negative for itching and rash.  Neurological: Negative for dizziness, tremors, focal weakness, seizures and loss of consciousness.  Endo/Heme/Allergies: Negative for environmental allergies.  Psychiatric/Behavioral: Negative for depression, suicidal ideas and hallucinations.  All other systems reviewed and are negative.  Physical Exam: Blood pressure 110/60, pulse 81, temperature 98.3 F  (36.8 C), temperature source Oral, height '5\' 3"'$  (1.6 m), weight 164 lb (74.4 kg), last menstrual period 07/16/2017, SpO2 96 %. Gen:      No acute distress HEENT:  EOMI, sclera anicteric Neck:     No masses; no thyromegaly Lungs:    Bilateral expiratory wheeze CV:         Regular rate and rhythm; no murmurs Abd:      + bowel sounds; soft, non-tender; no palpable masses, no distension Ext:    No edema; adequate peripheral perfusion Skin:      Warm and dry; no rash Neuro: alert and oriented x 3 Psych: normal mood and affect  Data Reviewed: Imaging: Chest x-ray 10/09/21 - no active cardiopulmonary disease. I have reviewed the images personally  PFTs:  Labs:  Assessment:  Cough  Suspect she has reactive airway disease, asthma that may be exacerbated by recent COVID infection. She has baseline allergies and is wheezing on examination today.  will check CBC differential, IGE and schedule PFTs give prednisone 40 mg a day for five days and start Symbicort If her cough continues after this then we may escalate to treatment of silent Jerrye Bushy or postnasal drip though there is no significant symptoms to suggest either  Though she had COVID-19 in January her chest x-ray in April 2023 does not show significant interstitial abnormalities. Consider had a CT if there was evidence of restriction or diffusion abnormality on PFTs  Plan/Recommendations: CBC, IgE PFTs Symbicort  Marshell Garfinkel MD [facility] 11/12/2021, 3:52 PM  CC: [name], FNP

## 2021-11-13 ENCOUNTER — Other Ambulatory Visit: Payer: Self-pay | Admitting: Nurse Practitioner

## 2021-11-13 DIAGNOSIS — R051 Acute cough: Secondary | ICD-10-CM

## 2021-11-13 DIAGNOSIS — Z8701 Personal history of pneumonia (recurrent): Secondary | ICD-10-CM

## 2021-11-13 LAB — CBC WITH DIFFERENTIAL/PLATELET
Basophils Absolute: 0.1 10*3/uL (ref 0.0–0.1)
Basophils Relative: 1 % (ref 0.0–3.0)
Eosinophils Absolute: 0.2 10*3/uL (ref 0.0–0.7)
Eosinophils Relative: 2.9 % (ref 0.0–5.0)
HCT: 41 % (ref 36.0–46.0)
Hemoglobin: 13.3 g/dL (ref 12.0–15.0)
Lymphocytes Relative: 26.8 % (ref 12.0–46.0)
Lymphs Abs: 2.3 10*3/uL (ref 0.7–4.0)
MCHC: 32.4 g/dL (ref 30.0–36.0)
MCV: 85.7 fl (ref 78.0–100.0)
Monocytes Absolute: 0.9 10*3/uL (ref 0.1–1.0)
Monocytes Relative: 10.8 % (ref 3.0–12.0)
Neutro Abs: 5.1 10*3/uL (ref 1.4–7.7)
Neutrophils Relative %: 58.5 % (ref 43.0–77.0)
Platelets: 295 10*3/uL (ref 150.0–400.0)
RBC: 4.78 Mil/uL (ref 3.87–5.11)
RDW: 16 % — ABNORMAL HIGH (ref 11.5–15.5)
WBC: 8.7 10*3/uL (ref 4.0–10.5)

## 2021-11-13 LAB — IGE: IgE (Immunoglobulin E), Serum: 203 kU/L — ABNORMAL HIGH (ref <=114)

## 2021-11-19 LAB — HM PAP SMEAR

## 2021-11-19 LAB — HM MAMMOGRAPHY: HM Mammogram: NORMAL (ref 0–4)

## 2021-12-10 ENCOUNTER — Other Ambulatory Visit: Payer: Self-pay | Admitting: Cardiology

## 2021-12-10 DIAGNOSIS — I10 Essential (primary) hypertension: Secondary | ICD-10-CM

## 2022-01-21 ENCOUNTER — Encounter: Payer: Self-pay | Admitting: Pulmonary Disease

## 2022-01-21 ENCOUNTER — Ambulatory Visit: Payer: BC Managed Care – PPO | Admitting: Pulmonary Disease

## 2022-01-21 ENCOUNTER — Ambulatory Visit (INDEPENDENT_AMBULATORY_CARE_PROVIDER_SITE_OTHER): Payer: BC Managed Care – PPO | Admitting: Pulmonary Disease

## 2022-01-21 VITALS — BP 108/60 | HR 91 | Temp 98.5°F | Ht 63.0 in | Wt 164.0 lb

## 2022-01-21 DIAGNOSIS — J4540 Moderate persistent asthma, uncomplicated: Secondary | ICD-10-CM

## 2022-01-21 DIAGNOSIS — R059 Cough, unspecified: Secondary | ICD-10-CM | POA: Diagnosis not present

## 2022-01-21 LAB — PULMONARY FUNCTION TEST
DL/VA % pred: 90 %
DL/VA: 3.87 ml/min/mmHg/L
DLCO cor % pred: 78 %
DLCO cor: 15.39 ml/min/mmHg
DLCO unc % pred: 78 %
DLCO unc: 15.39 ml/min/mmHg
FEF 25-75 Post: 0.84 L/sec
FEF 25-75 Pre: 1.02 L/sec
FEF2575-%Change-Post: -17 %
FEF2575-%Pred-Post: 35 %
FEF2575-%Pred-Pre: 43 %
FEV1-%Change-Post: 0 %
FEV1-%Pred-Post: 65 %
FEV1-%Pred-Pre: 65 %
FEV1-Post: 1.62 L
FEV1-Pre: 1.63 L
FEV1FVC-%Change-Post: 1 %
FEV1FVC-%Pred-Pre: 90 %
FEV6-%Change-Post: -2 %
FEV6-%Pred-Post: 72 %
FEV6-%Pred-Pre: 74 %
FEV6-Post: 2.25 L
FEV6-Pre: 2.32 L
FEV6FVC-%Change-Post: 0 %
FEV6FVC-%Pred-Post: 102 %
FEV6FVC-%Pred-Pre: 103 %
FVC-%Change-Post: -2 %
FVC-%Pred-Post: 70 %
FVC-%Pred-Pre: 72 %
FVC-Post: 2.27 L
FVC-Pre: 2.32 L
Post FEV1/FVC ratio: 71 %
Post FEV6/FVC ratio: 99 %
Pre FEV1/FVC ratio: 70 %
Pre FEV6/FVC Ratio: 100 %
RV % pred: 131 %
RV: 2.53 L
TLC % pred: 100 %
TLC: 4.93 L

## 2022-01-21 NOTE — Patient Instructions (Signed)
Glad you are doing well with your breathing Continue Symbicort inhaler Follow-up in 6 months.

## 2022-01-21 NOTE — Progress Notes (Signed)
PFT done today. 

## 2022-01-21 NOTE — Progress Notes (Addendum)
EARL ZELLMER    [id number]    [birth year]  Primary Care Physician:Moore, Doreene Burke, [place]  Referring Physician: [name], [address] [place],  [place] [address]  Chief complaint: Follow up for cough, post COVID 61  HPI: 61 year old with history of hypertension, hyperlipidemia. Develop COVID-19 in January 2023. She did not require hospitalization. However develop chronic cough and congestion since then requiring several rounds of prednisone. She went to urgent care in April and was diagnosed with pneumonia. I don't have the chest x-ray for review. A follow-up chest x-ray at the end of April by primary care shows clear lungs with no infiltrate  Chief complaint today is chronic cough which is nonproductive in nature, dyspnea with wheezing. Symptoms exacerbated at night when lying down. She denies any acid reflux but notes seasonal allergies. Symptoms usually get better when she is on short prednisone tables  Pets: no pets Occupation: Glass blower/designer at [employer] Exposures: no mold, hot tub, [place]. No further pillars or comforters. Smoking history: 13 pack year smoker. Continues to smoke half pack per day Travel history: no significant travel history Relevant family history: mom died of lung cancer. She was a smoker  Interim history: Breathing is doing well.  Cough is improved with initiation of Symbicort inhaler No new complaints today  Outpatient Encounter Medications as of 60/07/2021  Medication Sig   albuterol (VENTOLIN HFA) 108 (90 Base) MCG/ACT inhaler SMARTSIG:2 Puff(s) By Mouth 4 Times Daily PRN   amLODipine (NORVASC) 10 MG tablet TAKE 1 TABLET BY MOUTH EVERY DAY   aspirin 81 MG chewable tablet Chew 81 mg by mouth daily.   budesonide-formoterol (SYMBICORT) 160-4.5 MCG/ACT inhaler Inhale 2 puffs into the lungs in the morning and at bedtime.   Calcium Polycarbophil (FIBER) 625 MG TABS Take by mouth.   cholecalciferol (VITAMIN D3) 25 MCG  (1000 UT) tablet Take 1,000 Units by mouth daily.   [employer] MAGNESIUM OXIDE 250 MG TABS TAKE 1 TABLET (250 MG TOTAL) BY MOUTH EVERY EVENING. TAKE WITH EVENING MEAL   cycloSPORINE (RESTASIS) 0.05 % ophthalmic emulsion Place 1 drop into both eyes 2 (two) times daily.   diclofenac Sodium (VOLTAREN) 1 % GEL Apply 2 g topically 4 (four) times daily. (Patient taking differently: Apply 2 g topically 4 (four) times daily as needed (pain).)   ipratropium (ATROVENT) 0.06 % nasal spray PLACE 2 SPRAYS INTO THE NOSE 3 (THREE) TIMES DAILY.   losartan-hydrochlorothiazide (HYZAAR) 100-25 MG tablet TAKE 1 TABLET BY MOUTH EVERY DAY   meloxicam (MOBIC) 15 MG tablet TAKE 1 TABLET BY MOUTH ONCE A DAY WITH FOOD FOR INFLAMMATION   Multiple Vitamins-Minerals (MULTIVITAMIN WOMENS 50+ ADV PO) Take 1 tablet by mouth daily.   rosuvastatin (CRESTOR) 10 MG tablet Take 1 tablet (10 mg total) by mouth daily.   spironolactone (ALDACTONE) 25 MG tablet Take 1 tablet (25 mg total) by mouth daily.   [DISCONTINUED] predniSONE (DELTASONE) 20 MG tablet Take '40mg'$  for 5 days   [DISCONTINUED] promethazine-dextromethorphan (PROMETHAZINE-DM) 6.25-15 MG/5ML syrup Take 5 mLs by mouth every 4 (four) hours as needed.   escitalopram (LEXAPRO) 20 MG tablet Take 1 tablet (20 mg total) by mouth daily.   No facility-administered encounter medications on file as of 01/21/2022.    Physical Exam: Blood pressure 108/60, pulse 91, temperature 98.5 F (36.9 C), temperature source Oral, height '5\' 3"'$  (1.6 m), weight 164 lb (74.4 kg), last menstrual period 06/22/2017, SpO2 99 %. Gen:  No acute distress HEENT:  EOMI, sclera anicteric Neck:     No masses; no thyromegaly Lungs:    Clear to auscultation bilaterally; normal respiratory effort CV:         Regular rate and rhythm; no murmurs Abd:      + bowel sounds; soft, non-tender; no palpable masses, no distension Ext:    No edema; adequate peripheral perfusion Skin:      Warm and dry; no rash Neuro:  alert and oriented x 3 Psych: normal mood and affect   Data Reviewed: Imaging: Chest x-ray 10/09/21 - no active cardiopulmonary disease. I have reviewed the images personally  PFTs: 01/21/2022 FVC 2.27 [70%], FEV1 1.62 [65%], F/F 71 TLC 4.93 [100%], DLCO 15.39 [78%] Normal test  Labs: CBC 11/12/2021-WBC 8.7, eos 2.9%, absolute eosinophil count 252 IgE -203  Assessment:  Moderate persistent Suspect she has reactive airway disease, asthma that may be exacerbated by recent COVID infection. She has baseline allergies and is wheezing on examination today.  Symptoms improved with a prednisone taper and Symbicort Continue current therapy  Though she had COVID-19 in January her chest x-ray in April 2023 does not show significant interstitial abnormalities.   Plan/Recommendations: Continue Symbicort  Marshell Garfinkel MD [facility] 01/21/2022, 4:04 PM  CC: [name], FNP

## 2022-01-24 ENCOUNTER — Other Ambulatory Visit: Payer: Self-pay | Admitting: Pulmonary Disease

## 2022-01-27 ENCOUNTER — Other Ambulatory Visit (HOSPITAL_COMMUNITY): Payer: Self-pay

## 2022-02-12 ENCOUNTER — Encounter: Payer: BC Managed Care – PPO | Admitting: Nurse Practitioner

## 2022-02-25 ENCOUNTER — Encounter: Payer: Self-pay | Admitting: Nurse Practitioner

## 2022-02-25 ENCOUNTER — Ambulatory Visit (INDEPENDENT_AMBULATORY_CARE_PROVIDER_SITE_OTHER): Payer: BC Managed Care – PPO | Admitting: Nurse Practitioner

## 2022-02-25 VITALS — Temp 98.9°F | Ht 63.0 in | Wt 161.8 lb

## 2022-02-25 DIAGNOSIS — Z720 Tobacco use: Secondary | ICD-10-CM

## 2022-02-25 DIAGNOSIS — Z79899 Other long term (current) drug therapy: Secondary | ICD-10-CM

## 2022-02-25 DIAGNOSIS — R42 Dizziness and giddiness: Secondary | ICD-10-CM

## 2022-02-25 DIAGNOSIS — E559 Vitamin D deficiency, unspecified: Secondary | ICD-10-CM

## 2022-02-25 DIAGNOSIS — Z0000 Encounter for general adult medical examination without abnormal findings: Secondary | ICD-10-CM

## 2022-02-25 DIAGNOSIS — R7309 Other abnormal glucose: Secondary | ICD-10-CM | POA: Diagnosis not present

## 2022-02-25 DIAGNOSIS — Z2821 Immunization not carried out because of patient refusal: Secondary | ICD-10-CM

## 2022-02-25 DIAGNOSIS — I700 Atherosclerosis of aorta: Secondary | ICD-10-CM

## 2022-02-25 DIAGNOSIS — I10 Essential (primary) hypertension: Secondary | ICD-10-CM

## 2022-02-25 LAB — POCT URINALYSIS DIPSTICK
Bilirubin, UA: NEGATIVE
Blood, UA: NEGATIVE
Glucose, UA: NEGATIVE
Ketones, UA: NEGATIVE
Leukocytes, UA: NEGATIVE
Nitrite, UA: NEGATIVE
Protein, UA: NEGATIVE
Spec Grav, UA: 1.02 (ref 1.010–1.025)
Urobilinogen, UA: 0.2 E.U./dL
pH, UA: 6 (ref 5.0–8.0)

## 2022-02-25 MED ORDER — SPIRONOLACTONE 25 MG PO TABS: 12.5000 mg | ORAL_TABLET | Freq: Every day | ORAL | 3 refills | Status: DC

## 2022-02-25 NOTE — Progress Notes (Signed)
Barnet [place] Martin,acting as a Education administrator for [name], FNP.,have documented all relevant documentation on the behalf of [name], FNP,as directed by  [name], FNP while in the presence of [name], [place].   Subjective:     Patient ID: Evelyn [name] , female    DOB: 02/12/1961 , 61 y.o.   MRN: [id number]   Chief Complaint  Patient presents with   Annual Exam    HPI  Patient presents for HM, Patient states compliance with medications. She has been to pulmonology - she was started on Symbicort. She is to call her insurance to get a name of an inhaler that is more cost effective.   Patient states she is experiencing dizziness she believes it is from her BP medications she has been having dizziness for about 6 months and she also been seen for this previously. [facility]  Patient reports not taking BP medicines today.           Past Medical History:  Diagnosis Date   Anxiety    Arthritis    Blood transfusion without reported diagnosis    Calcification of abdominal aorta (HCC) 11/11/2017   Hyperlipidemia    Hypertension      Family History  Problem Relation Age of Onset   Diabetes Mother    COPD Mother    Renal Disease Mother    Heart attack Father 45   Stroke Brother    Colon cancer Neg Hx    Esophageal cancer Neg Hx    Liver cancer Neg Hx    Pancreatic cancer Neg Hx    Rectal cancer Neg Hx    Stomach cancer Neg Hx      Current Outpatient Medications:    albuterol (VENTOLIN HFA) 108 (90 Base) MCG/ACT inhaler, SMARTSIG:2 Puff(s) By Mouth 4 Times Daily PRN, Disp: , Rfl:    amLODipine (NORVASC) 10 MG tablet, TAKE 1 TABLET BY MOUTH EVERY DAY, Disp: 90 tablet, Rfl: 2   aspirin 81 MG chewable tablet, Chew 81 mg by mouth daily., Disp: , Rfl:    Calcium Polycarbophil (FIBER) 625 MG TABS, Take by mouth., Disp: , Rfl:    cholecalciferol (VITAMIN D3) 25 MCG (1000 UT) tablet, Take 1,000 Units by mouth daily., Disp: , Rfl:    [facility]S  MAGNESIUM OXIDE 250 MG TABS, TAKE 1 TABLET (250 MG TOTAL) BY MOUTH EVERY EVENING. TAKE WITH EVENING MEAL, Disp: 100 tablet, Rfl: 1   diclofenac Sodium (VOLTAREN) 1 % GEL, Apply 2 g topically 4 (four) times daily. (Patient taking differently: Apply 2 g topically 4 (four) times daily as needed (pain).), Disp: 100 g, Rfl: 2   escitalopram (LEXAPRO) 20 MG tablet, Take 1 tablet (20 mg total) by mouth daily., Disp: 30 tablet, Rfl: 0   ipratropium (ATROVENT) 0.06 % nasal spray, PLACE 2 SPRAYS INTO THE NOSE 3 (THREE) TIMES DAILY., Disp: 45 mL, Rfl: 1   losartan-hydrochlorothiazide (HYZAAR) 100-25 MG tablet, TAKE 1 TABLET BY MOUTH EVERY DAY, Disp: 90 tablet, Rfl: 1   meloxicam (MOBIC) 15 MG tablet, TAKE 1 TABLET BY MOUTH ONCE A DAY WITH FOOD FOR INFLAMMATION, Disp: , Rfl:    Multiple Vitamins-Minerals (MULTIVITAMIN WOMENS 50+ ADV PO), Take 1 tablet by mouth daily., Disp: , Rfl:    rosuvastatin (CRESTOR) 10 MG tablet, Take 1 tablet (10 mg total) by mouth daily., Disp: 90 tablet, Rfl: 1   SYMBICORT 160-4.5 MCG/ACT inhaler, INHALE 2 PUFFS INTO THE LUNGS IN THE MORNING AND  AT BEDTIME., Disp: 10.2 each, Rfl: 6   cycloSPORINE (RESTASIS) 0.05 % ophthalmic emulsion, Place 1 drop into both eyes 2 (two) times daily. (Patient not taking: Reported on 02/25/2022), Disp: 0.4 mL, Rfl: 0   spironolactone (ALDACTONE) 25 MG tablet, Take 0.5 tablets (12.5 mg total) by mouth daily., Disp: 90 tablet, Rfl: 3   Allergies  Allergen Reactions   Lisinopril Other (See Comments)    Intolerance cough       The patient states she is post menopausal status.   Patient's last menstrual period was 06/22/2017.. Negative for Dysmenorrhea and Negative for Menorrhagia. Negative for: breast discharge, breast lump(s), breast pain and breast self exam. Associated symptoms include abnormal vaginal bleeding. Pertinent negatives include abnormal bleeding (hematology), anxiety, decreased libido, depression, difficulty falling sleep, dyspareunia,  history of infertility, nocturia, sexual dysfunction, sleep disturbances, urinary incontinence, urinary urgency, vaginal discharge and vaginal itching. Diet regular. The patient states her exercise [name] is minimal with walking once a week on average.   The patient's tobacco use is:  Social History   Tobacco Use  Smoking Status Every Day   Packs/day: 0.50   Years: 38.00   Total pack years: 19.00   Types: Cigarettes  Smokeless Tobacco Never  Tobacco Comments   she is down to 5 cigarette per day/lt 01/21/22   She has been exposed to passive smoke. The patient's alcohol use is:  Social History   Substance and Sexual Activity  Alcohol Use Yes   Alcohol/week: 2.0 standard drinks of alcohol   Types: 2 Cans of beer per week   Comment: Beer 2 daily   Additional information: Last pap 2023 per patient will get record from [facility], next one scheduled for 2025.    Review of Systems  Constitutional: Negative.   HENT: Negative.    Eyes: Negative.   Respiratory: Negative.    Cardiovascular: Negative.   Gastrointestinal: Negative.   Endocrine: Negative.   Genitourinary: Negative.   Musculoskeletal: Negative.   Skin: Negative.   Allergic/Immunologic: Negative.   Neurological: Negative.   Hematological: Negative.   Psychiatric/Behavioral: Negative.       Today's Vitals   02/25/22 1445  Temp: 98.9 F (37.2 C)  TempSrc: Oral  Weight: 161 lb 12.8 oz (73.4 kg)  Height: '5\' 3"'  (1.6 m)  PainSc: 0-No pain   Body mass index is 28.66 kg/m.  Wt Readings from Last 3 Encounters:  02/25/22 161 lb 12.8 oz (73.4 kg)  01/21/22 164 lb (74.4 kg)  11/12/21 164 lb (74.4 kg)    Objective:  Physical Exam Vitals reviewed.  Constitutional:      General: She is not in acute distress.    Appearance: Normal appearance. She is well-developed. She is obese.  HENT:     Head: Normocephalic and atraumatic.     Right Ear: Hearing, tympanic membrane, ear canal and external ear normal. There  is no impacted cerumen.     Left Ear: Hearing, tympanic membrane, ear canal and external ear normal. There is no impacted cerumen.     Nose:     Comments: Deferred - masked    Mouth/Throat:     Comments: Deferred - masked Eyes:     General: Lids are normal.     Extraocular Movements: Extraocular movements intact.     Conjunctiva/sclera: Conjunctivae normal.     Pupils: Pupils are equal, round, and reactive to light.     Funduscopic exam:    Right eye: No papilledema.  Left eye: No papilledema.  Neck:     Thyroid: No thyroid mass.     Vascular: No carotid bruit.  Cardiovascular:     Rate and Rhythm: Normal rate and regular rhythm.     Pulses: Normal pulses.     Heart sounds: Normal heart sounds. No murmur heard. Pulmonary:     Effort: Pulmonary effort is normal.     Breath sounds: Normal breath sounds.  Chest:     Chest wall: No mass.  Breasts:    Tanner Score is 5.     Right: Normal. No mass or tenderness.     Left: Normal. No mass or tenderness.  Abdominal:     General: Abdomen is flat. Bowel sounds are normal. There is no distension.     Palpations: Abdomen is soft.     Tenderness: There is no abdominal tenderness.  Genitourinary:    Rectum: Guaiac result negative.  Musculoskeletal:        General: No swelling. Normal range of motion.     Cervical back: Full passive range of motion without pain, normal range of motion and neck supple.     Right lower leg: No edema.     Left lower leg: No edema.  Lymphadenopathy:     Upper Body:     Right upper body: No supraclavicular, axillary or pectoral adenopathy.     Left upper body: No supraclavicular, axillary or pectoral adenopathy.  Skin:    General: Skin is warm and dry.     Capillary Refill: Capillary refill takes less than 2 seconds.  Neurological:     General: No focal deficit present.     Mental Status: She is alert and oriented to person, place, and time.     Cranial Nerves: No cranial nerve deficit.      Sensory: No sensory deficit.  Psychiatric:        Mood and Affect: Mood normal.        Behavior: Behavior normal.        Thought Content: Thought content normal.        Judgment: Judgment normal.         Assessment And Plan:     1. Annual physical exam Behavior modifications discussed and diet history reviewed.   Pt will continue to exercise regularly and modify diet with low GI, plant based foods and decrease intake of processed foods.  Recommend intake of daily multivitamin, Vitamin D, and calcium.  Recommend mammogram and colonoscopy for preventive screenings, as well as recommend immunizations that include influenza, TDAP, and Shingles (declines) - Microalbumin / Creatinine Urine Ratio - POCT Urinalysis Dipstick (81002) - EKG 12-Lead  2. Essential hypertension Comments: Blood pressure controlled, continue current medications. 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  -Right atrial enlargement.  -Old inferior-apical infarct. - Microalbumin / Creatinine Urine Ratio - CMP14+EGFR - spironolactone (ALDACTONE) 25 MG tablet; Take 0.5 tablets (12.5 mg total) by mouth daily.  Dispense: 90 tablet; Refill: 3 - Ambulatory referral to Cardiology  3. Abnormal glucose Comments: HgbA1c is stable, continue focusing on healthy diet.  - Hemoglobin A1c - Lipid panel  4. Vitamin D deficiency - VITAMIN D 25 Hydroxy (Vit-D Deficiency, Fractures)  5. Aortic atherosclerosis (HCC) Comments: Continue statin, tolerating well.  - Ambulatory referral to Cardiology  6. Dizziness Comments: Orthostats are normal, encouraged to stay well hydrated with water. Her blood pressure is slightly low, she may need to decrease one of her  medications.  - TSH  7. Tobacco abuse Comments: Declines nicotine patches. Smoking cessation instruction/counseling given:  counseled patient on the dangers of tobacco use, advised patient to stop smoking, and reviewed  strategies to maximize success  8. Other long term (current) drug therapy - CBC no Diff  9. Immunization declined Declines influenza and shingrix vaccine    Patient was given opportunity to ask questions. Patient verbalized understanding of the plan and was able to repeat key elements of the plan. All questions were answered to their satisfaction.   [name], FNP    I, [name], FNP, have reviewed all documentation for this visit. The documentation on 02/25/22 for the exam, diagnosis, procedures, and orders are all accurate and complete.  THE PATIENT IS ENCOURAGED TO PRACTICE SOCIAL DISTANCING DUE TO THE COVID-19 PANDEMIC.

## 2022-02-25 NOTE — Patient Instructions (Signed)

## 2022-02-26 LAB — LIPID PANEL
Chol/HDL Ratio: 2 ratio (ref 0.0–4.4)
Cholesterol, Total: 171 mg/dL (ref 100–199)
HDL: 85 mg/dL (ref >39)
LDL Chol Calc (NIH): 67 mg/dL (ref 0–99)
Triglycerides: 112 mg/dL (ref 0–149)
VLDL Cholesterol Cal: 19 mg/dL (ref 5–40)

## 2022-02-26 LAB — CMP14+EGFR
ALT: 34 IU/L — ABNORMAL HIGH (ref 0–32)
AST: 27 IU/L (ref 0–40)
Albumin/Globulin Ratio: 2.2 (ref 1.2–2.2)
Albumin: 4.6 g/dL (ref 3.8–4.9)
Alkaline Phosphatase: 68 IU/L (ref 44–121)
BUN/Creatinine Ratio: 23 (ref 9–23)
BUN: 20 mg/dL (ref 6–24)
Bilirubin Total: 0.4 mg/dL (ref 0.0–1.2)
CO2: 24 mmol/L (ref 20–29)
Calcium: 10.1 mg/dL (ref 8.7–10.2)
Chloride: 102 mmol/L (ref 96–106)
Creatinine, Ser: 0.88 mg/dL (ref 0.57–1.00)
Globulin, Total: 2.1 g/dL (ref 1.5–4.5)
Glucose: 87 mg/dL (ref 70–99)
Potassium: 3.5 mmol/L (ref 3.5–5.2)
Sodium: 143 mmol/L (ref 134–144)
Total Protein: 6.7 g/dL (ref 6.0–8.5)
eGFR: 76 mL/min/{1.73_m2} (ref >59)

## 2022-02-26 LAB — CBC
Hematocrit: 43 % (ref 34.0–46.6)
Hemoglobin: 14 g/dL (ref 11.1–15.9)
MCH: 27.2 pg (ref 26.6–33.0)
MCHC: 32.6 g/dL (ref 31.5–35.7)
MCV: 84 fL (ref 79–97)
Platelets: 274 10*3/uL (ref 150–450)
RBC: 5.14 x10E6/uL (ref 3.77–5.28)
RDW: 15.7 % — ABNORMAL HIGH (ref 11.7–15.4)
WBC: 8.3 10*3/uL (ref 3.4–10.8)

## 2022-02-26 LAB — MICROALBUMIN / CREATININE URINE RATIO
Creatinine, Urine: 66.7 mg/dL
Microalb/Creat Ratio: 6 mg/g creat (ref 0–29)
Microalbumin, Urine: 4 ug/mL

## 2022-02-26 LAB — VITAMIN D 25 HYDROXY (VIT D DEFICIENCY, FRACTURES): Vit D, 25-Hydroxy: 36.4 ng/mL (ref 30.0–100.0)

## 2022-02-26 LAB — HEMOGLOBIN A1C
Est. average glucose Bld gHb Est-mCnc: 134 mg/dL
Hgb A1c MFr Bld: 6.3 % — ABNORMAL HIGH (ref 4.8–5.6)

## 2022-02-26 LAB — TSH: TSH: 0.893 u[IU]/mL (ref 0.450–4.500)

## 2022-04-02 NOTE — Progress Notes (Signed)
Cardiology Office Note:    Date:  04/03/2022   ID:  Evelyn [name], DOB [birth year], MRN [id number]  PCP:  [name], FNP  Cardiologist:  None   Referring MD: [name], FNP   Chief Complaint  Patient presents with   Hyperlipidemia   Hypertension   Advice Only    Abnormal EKG    History of Present Illness:    Evelyn [name] is a 61 y.o. female with a hx of right bundle branch block, tobacco use, prediabetes, essential hypertension, abdominal aortic calcification who was last seen by Dr. Rex Kras in August 2021 (who had a working impression list of aortic atherosclerosis/tobacco use/primary hypertension/hyperlipidemia/right bundle branch block/prediabetes).   The patient is here to establish new cardiology relationship.  She had been seen by Dr. Terri Skains and Ms. Claiborne Billings at [facility], she has issues as outlined above.  Additionally she snores and has interrupted sleep.  Excessive daytime sleepiness as well.  She is compliant with her efforts to control risk.  She is taking low-dose statin and has an LDL of 67 from September of this year.  Her hemoglobin A1c is 6.3.  Blood pressure today on her current medical regimen was 130/72 mmHg on a multidrug regimen including Norvasc 10 mg/day, Hyzaar 50/12.5 mg/day, Aldactone 12.5 mg/day.  She still has some orthostatic dizziness at times.  There may be room to further decrease her blood pressure medication.  She continues to smoke cigarettes.  She has smoked around 1 pack/day for greater than 20 years.  She is not ready to stop smoking.  Father died of MI at age 32, mother died of lung cancer at age 65.  Past Medical History:  Diagnosis Date   Anxiety    Arthritis    Blood transfusion without reported diagnosis    Calcification of abdominal aorta ([place]) 11/11/2017   Hyperlipidemia    Hypertension     Past Surgical History:  Procedure Laterality Date   CESAREAN SECTION     x2   FOOT SURGERY Left    Hammmer Toe    TUBAL LIGATION      Current Medications: Current Meds  Medication Sig   albuterol (VENTOLIN HFA) 108 (90 Base) MCG/ACT inhaler SMARTSIG:2 Puff(s) By Mouth 4 Times Daily PRN   amLODipine (NORVASC) 10 MG tablet TAKE 1 TABLET BY MOUTH EVERY DAY   aspirin 81 MG chewable tablet Chew 81 mg by mouth daily.   Calcium Polycarbophil (FIBER) 625 MG TABS Take by mouth.   cholecalciferol (VITAMIN D3) 25 MCG (1000 UT) tablet Take 1,000 Units by mouth daily.   [facility] MAGNESIUM OXIDE 250 MG TABS TAKE 1 TABLET (250 MG TOTAL) BY MOUTH EVERY EVENING. TAKE WITH EVENING MEAL   cycloSPORINE (RESTASIS) 0.05 % ophthalmic emulsion Place 1 drop into both eyes 2 (two) times daily.   diclofenac Sodium (VOLTAREN) 1 % GEL Apply 2 g topically 4 (four) times daily. (Patient taking differently: Apply 2 g topically 4 (four) times daily as needed (pain).)   ipratropium (ATROVENT) 0.06 % nasal spray PLACE 2 SPRAYS INTO THE NOSE 3 (THREE) TIMES DAILY.   losartan-hydrochlorothiazide (HYZAAR) 100-25 MG tablet TAKE 1 TABLET BY MOUTH EVERY DAY   meloxicam (MOBIC) 15 MG tablet TAKE 1 TABLET BY MOUTH ONCE A DAY WITH FOOD FOR INFLAMMATION   Multiple Vitamins-Minerals (MULTIVITAMIN WOMENS 50+ ADV PO) Take 1 tablet by mouth daily.   rosuvastatin (CRESTOR) 10 MG tablet Take 1 tablet (10 mg total) by mouth daily.   spironolactone (ALDACTONE) 25  MG tablet Take 0.5 tablets (12.5 mg total) by mouth daily.   SYMBICORT 160-4.5 MCG/ACT inhaler INHALE 2 PUFFS INTO THE LUNGS IN THE MORNING AND AT BEDTIME.     Allergies:   Lisinopril   Social History   Socioeconomic History   Marital status: Married    Spouse name: Not on file   Number of children: 2   Years of education: Not on file   Highest education level: Not on file  Occupational History   Occupation: Glass blower/designer  Tobacco Use   Smoking status: Every Day    Packs/day: 0.50    Years: 38.00    Total pack years: 19.00    Types: Cigarettes   Smokeless tobacco: Never   Tobacco  comments:    she is down to 5 cigarette per day/lt 01/21/22  Vaping Use   Vaping Use: Never used  Substance and Sexual Activity   Alcohol use: Yes    Alcohol/week: 2.0 standard drinks of alcohol    Types: 2 Cans of beer per week    Comment: Beer 2 daily   Drug use: Yes    Types: Marijuana    Comment: Episodic use of marijuana   Sexual activity: Yes  Other Topics Concern   Not on file  Social History Narrative   Pt lives in [place] with husband, son, nephew, and nephew's girlfriend   Social Determinants of Health   Financial Resource Strain: Not on file  Food Insecurity: Not on file  Transportation Needs: Not on file  Physical Activity: Not on file  Stress: Not on file  Social Connections: Not on file     Family History: The patient's family history includes COPD in her mother; Diabetes in her mother; Heart attack (age of onset: 71) in her father; Renal Disease in her mother; Stroke in her brother. There is no history of Colon cancer, Esophageal cancer, Liver cancer, Pancreatic cancer, Rectal cancer, or Stomach cancer.  ROS:   Please see the history of present illness.    Restless sleep.  Awakens multiple times each night.  All other systems reviewed and are negative.  EKGs/Labs/Other Studies Reviewed:    The following studies were reviewed today:  Myocardial perfusion imaging January 15, 2020: Lexiscan (Walking with Jaci Carrel) Sestamibi Stress Test 01/15/2020: Nondiagnostic ECG stress. Stress EKG converted to Lexiscan due to dyspnea.  Myocardial perfusion is normal. Overall LV systolic function is normal without regional wall motion abnormalities. Stress LV EF: 69%.  No previous exam available for comparison. Low risk.   2D Doppler echocardiogram 01/06/2019: Echocardiogram 01/06/2019:  Normal LV systolic function with EF 55%. Left ventricle cavity is normal  in size. Moderate concentric hypertrophy of the left ventricle. Normal  global wall motion. Doppler evidence of  grade I (impaired) diastolic  dysfunction, normal LAP.  No significant valvular abnormalities.  Inadequate TR jet to estimate pulmonary artery systolic pressure. Normal  right atrial pressure.   EKG:  EKG right bundle branch block, left anterior hemiblock, left atrial abnormality and right atrial abnormality.  Recent Labs: 02/25/2022: ALT 34; BUN 20; Creatinine, Ser 0.88; Hemoglobin 14.0; Platelets 274; Potassium 3.5; Sodium 143; TSH 0.893  Recent Lipid Panel    Component Value Date/Time   CHOL 171 02/25/2022 1600   TRIG 112 02/25/2022 1600   HDL 85 02/25/2022 1600   CHOLHDL 2.0 02/25/2022 1600   CHOLHDL 3.8 06/10/2020 1804   VLDL 50 (H) 06/10/2020 1804   LDLCALC 67 02/25/2022 1600   LDLDIRECT 65 01/04/2020 1559  Physical Exam:    VS:  BP 130/72   Pulse 88   Ht '5\' 3"'$  (1.6 m)   Wt 161 lb 12.8 oz (73.4 kg)   LMP 06/22/2017   SpO2 95%   BMI 28.66 kg/m     Wt Readings from Last 3 Encounters:  04/03/22 161 lb 12.8 oz (73.4 kg)  02/25/22 161 lb 12.8 oz (73.4 kg)  01/21/22 164 lb (74.4 kg)     GEN: Overweight with BMI 29. No acute distress HEENT: Normal NECK: No JVD. LYMPHATICS: No lymphadenopathy CARDIAC: No murmur. RRR no gallop, or edema. VASCULAR:  Normal Pulses. No bruits. RESPIRATORY:  Clear to auscultation without rales, wheezing or rhonchi  ABDOMEN: Soft, non-tender, non-distended, No pulsatile mass, MUSCULOSKELETAL: No deformity  SKIN: Warm and dry NEUROLOGIC:  Alert and oriented x 3 PSYCHIATRIC:  Normal affect   ASSESSMENT:    1. Primary hypertension   2. Calcification of abdominal aorta (HCC)   3. Snores   4. Bifascicular block   5. Hyperlipidemia LDL goal <70   6. Tobacco use   7. Prediabetes    PLAN:    In order of problems listed above:  Uncontrolled on current medical regimen.  There are symptoms that suggest orthostasis.  This may mean that she could further reduce her diuretic regimen and rather than using both spironolactone and HCTZ at  low doses, 1 of those could be discontinued or further reduced.  Target is 130/80 mmHg and she is at that target today.  No changes made. Atherosclerosis demands control of risk factors including lipids, blood pressure, and diabetes.  Smoking would be the most important of those relative none coronary arterial disease. She needs a sleep study because she sleeps for 2 hours at a time.  Does not get more than 4 to 5 hours of sleep at night and has excessive daytime sleepiness.  She states her husband complains that she snores. Right bundle with left anterior hemiblock. Continue rosuvastatin 10 mg a day.  LDL target less than 70. We spent 5 minutes discussing smoking cessation.  She is not ready to commit. She owns a diagnosis of prediabetes.  Current A1c is 6.3.  Exercise and decrease carbohydrate diet are discussed.   He is doing an excellent job.  I do not know that she needs chronic longitudinal cardiology follow-up but will require an EKG once a year, needs to be counseled and perhaps helped to discontinue cigarette smoking, and if sleep apnea is present we need to encourage effective management.  No specific cardiac work-up is needed now she is doing an excellent job of controlling the risk factors outside of smoking and the possibility of sleep apnea.  Medication Adjustments/Labs and Tests Ordered: Current medicines are reviewed at length with the patient today.  Concerns regarding medicines are outlined above.  Orders Placed This Encounter  Procedures   EKG 12-Lead   Home sleep test   No orders of the defined types were placed in this encounter.   Patient Instructions  Medication Instructions:  Your physician recommends that you continue on your current medications as directed. Please refer to the Current Medication list given to you today.  *If you need a refill on your cardiac medications before your next appointment, please call your pharmacy*   Lab  Work: NONE  Testing/Procedures: Your physician has requested you have a home sleep study performed. You will be contacted by the sleep center at [facility] to help arrange this.  Follow-Up: As needed  Important Information About Sugar         Signed, Sinclair Grooms, MD  04/03/2022 5:08 PM    [facility]

## 2022-04-03 ENCOUNTER — Encounter: Payer: Self-pay | Admitting: Interventional Cardiology

## 2022-04-03 ENCOUNTER — Ambulatory Visit: Payer: BC Managed Care – PPO | Attending: Interventional Cardiology | Admitting: Interventional Cardiology

## 2022-04-03 VITALS — BP 130/72 | HR 88 | Ht 63.0 in | Wt 161.8 lb

## 2022-04-03 DIAGNOSIS — I452 Bifascicular block: Secondary | ICD-10-CM

## 2022-04-03 DIAGNOSIS — I700 Atherosclerosis of aorta: Secondary | ICD-10-CM

## 2022-04-03 DIAGNOSIS — R0683 Snoring: Secondary | ICD-10-CM | POA: Diagnosis not present

## 2022-04-03 DIAGNOSIS — R7303 Prediabetes: Secondary | ICD-10-CM

## 2022-04-03 DIAGNOSIS — E785 Hyperlipidemia, unspecified: Secondary | ICD-10-CM

## 2022-04-03 DIAGNOSIS — I10 Essential (primary) hypertension: Secondary | ICD-10-CM | POA: Diagnosis not present

## 2022-04-03 DIAGNOSIS — Z720 Tobacco use: Secondary | ICD-10-CM

## 2022-04-03 NOTE — Patient Instructions (Signed)
Medication Instructions:  Your physician recommends that you continue on your current medications as directed. Please refer to the Current Medication list given to you today.  *If you need a refill on your cardiac medications before your next appointment, please call your pharmacy*   Lab Work: NONE  Testing/Procedures: Your physician has requested you have a home sleep study performed. You will be contacted by the sleep center at [facility] to help arrange this.  Follow-Up: As needed  Important Information About Sugar

## 2022-04-30 ENCOUNTER — Other Ambulatory Visit: Payer: Self-pay | Admitting: Nurse Practitioner

## 2022-04-30 DIAGNOSIS — I10 Essential (primary) hypertension: Secondary | ICD-10-CM

## 2022-05-08 ENCOUNTER — Telehealth: Payer: Self-pay | Admitting: Pulmonary Disease

## 2022-05-08 NOTE — Telephone Encounter (Signed)
Called and spoke with pt who states her current Symbicort Rx is too expensive and she wants to know if there is something else that could be recommended instead.  Routing to both prior auth team and Dr. Vaughan Browner for review of this.

## 2022-05-11 ENCOUNTER — Other Ambulatory Visit (HOSPITAL_COMMUNITY): Payer: Self-pay

## 2022-05-11 NOTE — Telephone Encounter (Signed)
Per benefits investigaton these are the alternatives covered by the patients plan at this time: Brand Advair Diskus-$18.23 Advair HFA-$97.59 Breo Ellipta-$95.09

## 2022-05-13 ENCOUNTER — Encounter: Payer: Self-pay | Admitting: Pulmonary Disease

## 2022-05-13 ENCOUNTER — Telehealth: Payer: Self-pay | Admitting: Pulmonary Disease

## 2022-05-13 NOTE — Telephone Encounter (Signed)
Spoke to pt and informed her of the cost of Brand Advair Diskus-$18.23, Advair HFA-$97.59, Breo Ellipta-$95.09 pt ask if we can send in Advair Diskus for her because it's the cheaper option to Symbicort.

## 2022-05-13 NOTE — Telephone Encounter (Signed)
Patient called answering service to report that she is having shortness of breath. She has a history of moderate persistent asthma and is follow in the clinic by Dr. Alexander Bergeron the patient at [phone]. The phone was answered and the patient (or an answering device) stated the patient's name, but did not acknowledge me speaking to her on the phone.  Called a third time and had the same response. Attempted to leave a message. I am not sure that the patient will receive to either go to the ED if she is severely SOB or call the PCCM office in the AM should she feel that her breathing was stable enough to wait for the office to open.

## 2022-05-18 ENCOUNTER — Emergency Department (HOSPITAL_COMMUNITY): Payer: BC Managed Care – PPO

## 2022-05-18 ENCOUNTER — Encounter (HOSPITAL_COMMUNITY): Payer: Self-pay | Admitting: Emergency Medicine

## 2022-05-18 ENCOUNTER — Other Ambulatory Visit: Payer: Self-pay

## 2022-05-18 ENCOUNTER — Emergency Department (HOSPITAL_COMMUNITY)
Admission: EM | Admit: 2022-05-18 | Discharge: 2022-05-18 | Disposition: A | Discharge status: Home / Self Care | Payer: BC Managed Care – PPO | Attending: Emergency Medicine | Admitting: Emergency Medicine

## 2022-05-18 DIAGNOSIS — Z1152 Encounter for screening for COVID-19: Secondary | ICD-10-CM | POA: Insufficient documentation

## 2022-05-18 DIAGNOSIS — R0602 Shortness of breath: Secondary | ICD-10-CM | POA: Diagnosis present

## 2022-05-18 DIAGNOSIS — Z7982 Long term (current) use of aspirin: Secondary | ICD-10-CM | POA: Insufficient documentation

## 2022-05-18 DIAGNOSIS — R0600 Dyspnea, unspecified: Secondary | ICD-10-CM | POA: Insufficient documentation

## 2022-05-18 DIAGNOSIS — Z8616 Personal history of COVID-19: Secondary | ICD-10-CM | POA: Diagnosis not present

## 2022-05-18 LAB — RESP PANEL BY RT-PCR (FLU A&B, COVID) ARPGX2
Influenza A by PCR: NEGATIVE
Influenza B by PCR: NEGATIVE
SARS Coronavirus 2 by RT PCR: NEGATIVE

## 2022-05-18 MED ORDER — METHYLPREDNISOLONE 4 MG PO TBPK: ORAL_TABLET | ORAL | 0 refills | Status: DC

## 2022-05-18 NOTE — ED Triage Notes (Signed)
Pt BIB [facility] from work due to [facility] and feeling throat tightness.  Feb 2023 pt got COVID and has been having a lot of complications from that diagnosis.  Last 2 wks pt has had increased SHOB and increased cough that is non-productive with lower back pain.  Pt reports no chest.  Hx of bundle branch block that she has seen cardiology for that is not new.  VSS.

## 2022-05-18 NOTE — ED Provider Notes (Signed)
[facility] EMERGENCY DEPARTMENT Provider Note   CSN: [id number] Arrival date & time: 05/18/22  1447     History  Chief Complaint  Patient presents with   Shortness of Breath    Evelyn [name] is a 61 y.o. female.   Shortness of Breath Presents with shortness of breath and cough.  Has had episodes of this since getting COVID around 9 months ago.  Had not seen pulmonary and cardiology.  Worsening cough over the last couple weeks.  More shortness of breath.  Nonproductive.  Mild aching.  No chest pain.  States she is been using her albuterol inhaler more.  Previously had a Symbicort inhaler but is been out of it due to cost issues.  They are attempting to get another inhaler for her.  No fevers.  Slight tightness in her throat. Has had some hypotension also.  Has been seen by PCP and has had some adjustment of her medicines for it.  Does not feel lightheaded or dizzy.  I reviewed the blood work and I have reviewed notes from both pulmonary and cardiology visits.   Home Medications Prior to Admission medications   Medication Sig Start Date End Date Taking? Authorizing Provider  methylPREDNISolone (MEDROL DOSEPAK) 4 MG TBPK tablet Use per box instructions. 05/18/22  Yes Davonna Belling, MD  albuterol (VENTOLIN HFA) 108 (90 Base) MCG/ACT inhaler SMARTSIG:2 Puff(s) By Mouth 4 Times Daily PRN 10/10/21   [provider]  amLODipine (NORVASC) 10 MG tablet TAKE 1 TABLET BY MOUTH EVERY DAY 01/13/21   Tolia, Sunit, DO  aspirin 81 MG chewable tablet Chew 81 mg by mouth daily.    [provider]  Calcium Polycarbophil (FIBER) 625 MG TABS Take by mouth.    [provider]  cholecalciferol (VITAMIN D3) 25 MCG (1000 UT) tablet Take 1,000 Units by mouth daily.    [provider]  [facility] MAGNESIUM OXIDE 250 MG TABS TAKE 1 TABLET (250 MG TOTAL) BY MOUTH EVERY EVENING. TAKE WITH EVENING MEAL 04/14/21   [name], FNP  cycloSPORINE (RESTASIS) 0.05 %  ophthalmic emulsion Place 1 drop into both eyes 2 (two) times daily. 06/13/20   Briant Cedar, MD  diclofenac Sodium (VOLTAREN) 1 % GEL Apply 2 g topically 4 (four) times daily. Patient taking differently: Apply 2 g topically 4 (four) times daily as needed (pain). 07/17/19   [name], FNP  escitalopram (LEXAPRO) 20 MG tablet Take 1 tablet (20 mg total) by mouth daily. 06/14/20 02/25/22  Briant Cedar, MD  ipratropium (ATROVENT) 0.06 % nasal spray PLACE 2 SPRAYS INTO THE NOSE 3 (THREE) TIMES DAILY. 11/13/21 11/13/22  [name], FNP  levocetirizine (XYZAL) 5 MG tablet every evening.    [provider]  losartan-hydrochlorothiazide (HYZAAR) 100-25 MG tablet TAKE 1 TABLET BY MOUTH EVERY DAY 04/30/22   [name], FNP  meloxicam (MOBIC) 15 MG tablet TAKE 1 TABLET BY MOUTH ONCE A DAY WITH FOOD FOR INFLAMMATION 09/09/20   [provider]  Multiple Vitamins-Minerals (MULTIVITAMIN WOMENS 50+ ADV PO) Take 1 tablet by mouth daily.    [provider]  rosuvastatin (CRESTOR) 10 MG tablet Take 1 tablet (10 mg total) by mouth daily. 09/15/21   [name], FNP  spironolactone (ALDACTONE) 25 MG tablet Take 0.5 tablets (12.5 mg total) by mouth daily. 02/25/22   [name], FNP  SYMBICORT 160-4.5 MCG/ACT inhaler INHALE 2 PUFFS INTO THE LUNGS IN THE MORNING AND AT BEDTIME. 02/04/22   Marshell Garfinkel, MD  Allergies    Lisinopril    Review of Systems   Review of Systems  Respiratory:  Positive for shortness of breath.     Physical Exam Updated Vital Signs BP (!) 111/59 (BP Location: Left Arm)   Pulse 91   Temp 97.6 F (36.4 C) (Oral)   Resp 16   Ht '5\' 3"'$  (1.6 m)   Wt 73.9 kg   LMP 06/22/2017   SpO2 97%   BMI 28.87 kg/m  Physical Exam Vitals and nursing note reviewed.  Cardiovascular:     Rate and Rhythm: Regular rhythm.  Pulmonary:     Breath sounds: No wheezing, rhonchi or rales.  Chest:     Chest wall: No tenderness.  Abdominal:      Tenderness: There is no abdominal tenderness.  Musculoskeletal:     Cervical back: Neck supple.     Right lower leg: No edema.     Left lower leg: No edema.  Neurological:     Mental Status: She is alert.     ED Results / Procedures / Treatments   Labs (all labs ordered are listed, but only abnormal results are displayed) Labs Reviewed  RESP PANEL BY RT-PCR (FLU A&B, COVID) [id number]    EKG EKG Interpretation  Date/Time:  Monday May 18 2022 14:56:28 EST Ventricular Rate:  97 PR Interval:  157 QRS Duration: 137 QT Interval:  356 QTC Calculation: 453 R Axis:   255 Text Interpretation: Sinus rhythm Consider left atrial enlargement Right bundle branch block No significant change since last tracing Confirmed by Davonna Belling [phone]) on 05/18/2022 3:00:12 PM  Radiology DG Chest Portable 1 View  Result Date: 05/18/2022 CLINICAL DATA:  61 year old female presents for evaluation of cough. EXAM: PORTABLE CHEST 1 VIEW COMPARISON:  October 09, 2021. FINDINGS: EKG leads project over the chest. Cardiomediastinal contours and hilar structures are stable. Lungs are clear. No pneumothorax. No sign of effusion on AP radiograph. On limited assessment no acute skeletal process. IMPRESSION: No acute cardiopulmonary disease. Electronically Signed   By: Zetta Bills M.D.   On: 05/18/2022 16:30    Procedures Procedures    Medications Ordered in ED Medications - No data to display  ED Course/ Medical Decision Making/ A&P                           Medical Decision Making Amount and/or Complexity of Data Reviewed Radiology: ordered.  Risk Prescription drug management.   Patient with shortness of breath and cough.  Has had shortness of breath since having COVID around 9 months ago.  Has been seen by pulmonary.  Reviewing the notes it appears he thinks there may be some reactive airway disease from this.  Has been out of her Symbicort inhaler.  Lungs are clear.  Differential diagnosis  includes URI symptoms, reactive lung disease, pneumonia.  Will get chest x-ray.  Will get flu and COVID testing.  Potential need new steroid dose.  Reviewed blood work from recent visits do not think we need to recheck at this time.  Mild hypotension but that apparently has also been somewhat chronic. Chest x-ray reassuring.  Will give steroid.  Negative flu and COVID.  Follow-up as needed as an outpatient.        Final Clinical Impression(s) / ED Diagnoses Final diagnoses:  Dyspnea, unspecified type    Rx / DC Orders ED Discharge Orders          Ordered  methylPREDNISolone (MEDROL DOSEPAK) 4 MG TBPK tablet        05/18/22 1646              Davonna Belling, MD 05/18/22 2328

## 2022-05-19 ENCOUNTER — Telehealth: Payer: Self-pay | Admitting: Pulmonary Disease

## 2022-05-19 NOTE — Telephone Encounter (Signed)
Yes. Please send in order for advair 250

## 2022-05-19 NOTE — Telephone Encounter (Signed)
Patient called to ask the nurse or doctor to approve her inhaler.  She stated she was waiting for a call from the doctor with the approval for inhaler since she just got out of the hospital.  Please call to discuss at [phone]

## 2022-05-19 NOTE — Telephone Encounter (Signed)
Attempted to call pt but unable to reach. Left message for her to return call. 

## 2022-05-20 MED ORDER — FLUTICASONE-SALMETEROL 250-50 MCG/ACT IN AEPB: 1.0000 | INHALATION_SPRAY | Freq: Two times a day (BID) | RESPIRATORY_TRACT | 5 refills | Status: DC

## 2022-05-20 NOTE — Telephone Encounter (Signed)
Called and spoke with patient.  Advair 250 prescription sent to requested [facility] [place].  Nothing further at this time.

## 2022-05-22 ENCOUNTER — Telehealth: Payer: Self-pay

## 2022-05-22 ENCOUNTER — Other Ambulatory Visit: Payer: Self-pay | Admitting: Cardiology

## 2022-05-22 DIAGNOSIS — I10 Essential (primary) hypertension: Secondary | ICD-10-CM

## 2022-05-22 NOTE — Telephone Encounter (Signed)
Transition Care Management Unsuccessful Follow-up Telephone Call  Date of discharge and from where:  05/18/2022 [place]   Attempts:  1st Attempt  Reason for unsuccessful TCM follow-up call:  Left voice message

## 2022-05-25 ENCOUNTER — Telehealth: Payer: Self-pay

## 2022-05-25 NOTE — Telephone Encounter (Signed)
Transition Care Management Unsuccessful Follow-up Telephone Call  Date of discharge and from where:  05/18/2022   Attempts:  2nd Attempt  Reason for unsuccessful TCM follow-up call:  Left voice message

## 2022-05-26 ENCOUNTER — Other Ambulatory Visit: Payer: Self-pay

## 2022-05-26 DIAGNOSIS — I10 Essential (primary) hypertension: Secondary | ICD-10-CM

## 2022-05-26 MED ORDER — AMLODIPINE BESYLATE 10 MG PO TABS: 10.0000 mg | ORAL_TABLET | Freq: Every day | ORAL | 0 refills | Status: DC

## 2022-06-01 NOTE — Telephone Encounter (Signed)
See encounter from 11/17. Will close this encounter.

## 2022-06-16 ENCOUNTER — Other Ambulatory Visit: Payer: Self-pay

## 2022-06-16 ENCOUNTER — Emergency Department (HOSPITAL_BASED_OUTPATIENT_CLINIC_OR_DEPARTMENT_OTHER)
Admission: EM | Admit: 2022-06-16 | Discharge: 2022-06-17 | Disposition: A | Discharge status: Home / Self Care | Payer: BC Managed Care – PPO | Attending: Emergency Medicine | Admitting: Emergency Medicine

## 2022-06-16 ENCOUNTER — Emergency Department (HOSPITAL_BASED_OUTPATIENT_CLINIC_OR_DEPARTMENT_OTHER): Payer: BC Managed Care – PPO | Admitting: Radiology

## 2022-06-16 ENCOUNTER — Telehealth: Payer: Self-pay | Admitting: Pulmonary Disease

## 2022-06-16 ENCOUNTER — Encounter (HOSPITAL_BASED_OUTPATIENT_CLINIC_OR_DEPARTMENT_OTHER): Payer: Self-pay

## 2022-06-16 DIAGNOSIS — Z7951 Long term (current) use of inhaled steroids: Secondary | ICD-10-CM | POA: Diagnosis not present

## 2022-06-16 DIAGNOSIS — Z20822 Contact with and (suspected) exposure to covid-19: Secondary | ICD-10-CM | POA: Diagnosis not present

## 2022-06-16 DIAGNOSIS — J101 Influenza due to other identified influenza virus with other respiratory manifestations: Secondary | ICD-10-CM | POA: Insufficient documentation

## 2022-06-16 DIAGNOSIS — Z79899 Other long term (current) drug therapy: Secondary | ICD-10-CM | POA: Insufficient documentation

## 2022-06-16 DIAGNOSIS — R0602 Shortness of breath: Secondary | ICD-10-CM | POA: Diagnosis present

## 2022-06-16 DIAGNOSIS — R000 Tachycardia, unspecified: Secondary | ICD-10-CM | POA: Insufficient documentation

## 2022-06-16 DIAGNOSIS — Z7982 Long term (current) use of aspirin: Secondary | ICD-10-CM | POA: Insufficient documentation

## 2022-06-16 DIAGNOSIS — Z8616 Personal history of COVID-19: Secondary | ICD-10-CM | POA: Diagnosis not present

## 2022-06-16 DIAGNOSIS — J45909 Unspecified asthma, uncomplicated: Secondary | ICD-10-CM | POA: Insufficient documentation

## 2022-06-16 LAB — RESP PANEL BY RT-PCR (RSV, FLU A&B, COVID)  RVPGX2
Influenza A by PCR: POSITIVE — AB
Influenza B by PCR: NEGATIVE
Resp Syncytial Virus by PCR: NEGATIVE
SARS Coronavirus 2 by RT PCR: NEGATIVE

## 2022-06-16 MED ORDER — ACETAMINOPHEN 500 MG PO TABS
1000.0000 mg | ORAL_TABLET | Freq: Once | ORAL | Status: AC
  Administered 2022-06-16: 1000 mg via ORAL
  Filled 2022-06-16: qty 2

## 2022-06-16 MED ORDER — BENZONATATE 100 MG PO CAPS
200.0000 mg | ORAL_CAPSULE | Freq: Once | ORAL | Status: AC
  Administered 2022-06-16: 200 mg via ORAL
  Filled 2022-06-16: qty 2

## 2022-06-16 MED ORDER — ALBUTEROL SULFATE (2.5 MG/3ML) 0.083% IN NEBU
2.5000 mg | INHALATION_SOLUTION | Freq: Once | RESPIRATORY_TRACT | Status: AC
  Administered 2022-06-16: 2.5 mg via RESPIRATORY_TRACT
  Filled 2022-06-16: qty 3

## 2022-06-16 MED ORDER — PREDNISONE 50 MG PO TABS
60.0000 mg | ORAL_TABLET | Freq: Once | ORAL | Status: AC
  Administered 2022-06-17: 60 mg via ORAL
  Filled 2022-06-16: qty 1

## 2022-06-16 NOTE — Telephone Encounter (Signed)
Last seen August 2023.  She has been on Symbicort and Advair which are both similar medications if she is now having increased side effects.  Can set up office visit for further evaluation.  If she is having increased cough.  Try Delsym 2 teaspoons twice daily as needed for cough.  Please contact office for sooner follow up if symptoms do not improve or worsen or seek emergency care   Sent to Dr. Vaughan Browner for [facility]

## 2022-06-16 NOTE — ED Provider Notes (Signed)
[facility] EMERGENCY DEPT Provider Note   CSN: [id number] Arrival date & time: 06/16/22  1804     History Chief Complaint  Patient presents with   Shortness of Breath    Evelyn [name] is a 61 y.o. female.   Shortness of Breath Associated symptoms: cough and headaches   Associated symptoms: no chest pain and no fever   Patient presents emergency department complaints of shortness of breath.  She reports that she has had this for several days now and was concerned because she previously had COVID-19 about 1 year ago which was very difficult for her to overcome and ultimately led to a pneumonia diagnosis.  Patient currently uses albuterol inhalers at home for asthma but denies any other pulmonary disease such as COPD, restrictive lung disease, interstitial lung disease.  Patient also reports associated headaches but she believes that this is a side effect of one of the medications that her pulmonologist has her currently taking for the asthma.  Otherwise denies fevers, chest pain, abdominal pain, nausea, vomiting, diarrhea.     Home Medications Prior to Admission medications   Medication Sig Start Date End Date Taking? Authorizing Provider  albuterol (VENTOLIN HFA) 108 (90 Base) MCG/ACT inhaler SMARTSIG:2 Puff(s) By Mouth 4 Times Daily PRN 10/10/21   [provider]  amLODipine (NORVASC) 10 MG tablet Take 1 tablet (10 mg total) by mouth daily. *PT WILL NEED TO CONTACT PCP FOR REFILLS* 05/26/22   Belva Crome, MD  aspirin 81 MG chewable tablet Chew 81 mg by mouth daily.    [provider]  Calcium Polycarbophil (FIBER) 625 MG TABS Take by mouth.    [provider]  cholecalciferol (VITAMIN D3) 25 MCG (1000 UT) tablet Take 1,000 Units by mouth daily.    [provider]  [facility] MAGNESIUM OXIDE 250 MG TABS TAKE 1 TABLET (250 MG TOTAL) BY MOUTH EVERY EVENING. TAKE WITH EVENING MEAL 04/14/21   [name], FNP  cycloSPORINE (RESTASIS) 0.05 %  ophthalmic emulsion Place 1 drop into both eyes 2 (two) times daily. 06/13/20   Briant Cedar, MD  diclofenac Sodium (VOLTAREN) 1 % GEL Apply 2 g topically 4 (four) times daily. Patient taking differently: Apply 2 g topically 4 (four) times daily as needed (pain). 07/17/19   [name], FNP  escitalopram (LEXAPRO) 20 MG tablet Take 1 tablet (20 mg total) by mouth daily. 06/14/20 02/25/22  Briant Cedar, MD  fluticasone-salmeterol (ADVAIR) 250-50 MCG/ACT AEPB Inhale 1 puff into the lungs every 12 (twelve) hours. 05/20/22   Mannam, Praveen, MD  ipratropium (ATROVENT) 0.06 % nasal spray PLACE 2 SPRAYS INTO THE NOSE 3 (THREE) TIMES DAILY. 11/13/21 11/13/22  [name], FNP  levocetirizine (XYZAL) 5 MG tablet every evening.    [provider]  losartan-hydrochlorothiazide (HYZAAR) 100-25 MG tablet TAKE 1 TABLET BY MOUTH EVERY DAY 04/30/22   [name], FNP  meloxicam (MOBIC) 15 MG tablet TAKE 1 TABLET BY MOUTH ONCE A DAY WITH FOOD FOR INFLAMMATION 09/09/20   [provider]  methylPREDNISolone (MEDROL DOSEPAK) 4 MG TBPK tablet Use per box instructions. 05/18/22   Davonna Belling, MD  Multiple Vitamins-Minerals (MULTIVITAMIN WOMENS 50+ ADV PO) Take 1 tablet by mouth daily.    [provider]  rosuvastatin (CRESTOR) 10 MG tablet Take 1 tablet (10 mg total) by mouth daily. 09/15/21   [name], FNP  spironolactone (ALDACTONE) 25 MG tablet Take 0.5 tablets (12.5 mg total) by mouth daily. 02/25/22   [name], [place]  Allergies    Lisinopril    Review of Systems   Review of Systems  Constitutional:  Negative for chills and fever.  Respiratory:  Positive for cough and shortness of breath. Negative for choking and chest tightness.   Cardiovascular:  Negative for chest pain.  Neurological:  Positive for headaches. Negative for weakness and numbness.  All other systems reviewed and are negative.   Physical Exam Updated Vital Signs BP 137/70   Pulse  (!) 118   Temp 99.2 F (37.3 C) (Oral)   Resp 20   Ht '5\' 3"'$  (1.6 m)   Wt 73.9 kg   LMP 06/22/2017   SpO2 (!) 88%   BMI 28.86 kg/m  Physical Exam Vitals and nursing note reviewed.  Constitutional:      General: She is not in acute distress.    Appearance: She is well-developed and normal weight. She is not ill-appearing.  HENT:     Head: Normocephalic and atraumatic.  Cardiovascular:     Rate and Rhythm: Regular rhythm. Tachycardia present.  Pulmonary:     Breath sounds: No stridor. Examination of the right-upper field reveals wheezing. Examination of the left-upper field reveals wheezing. Examination of the right-middle field reveals wheezing. Examination of the left-middle field reveals wheezing. Wheezing present.     Comments: Increased work of breathing on initial assessment the patient is not tachypneic. Skin:    General: Skin is warm and dry.     Capillary Refill: Capillary refill takes less than 2 seconds.  Neurological:     General: No focal deficit present.     Mental Status: She is alert.     ED Results / Procedures / Treatments   Labs (all labs ordered are listed, but only abnormal results are displayed) Labs Reviewed  RESP PANEL BY RT-PCR (RSV, FLU A&B, COVID)  RVPGX2 - Abnormal; Notable for the following components:      Result Value   Influenza A by PCR POSITIVE (*)    All other components within normal limits  CBC WITH DIFFERENTIAL/PLATELET  COMPREHENSIVE METABOLIC PANEL    EKG None  Radiology DG Chest 2 View  Result Date: 06/16/2022 CLINICAL DATA:  Shortness of breath EXAM: CHEST - 2 VIEW COMPARISON:  May 18, 2022 FINDINGS: Shortness of breath.  Asthma. IMPRESSION: No active cardiopulmonary disease. Electronically Signed   By: Dorise Bullion III M.D.   On: 06/16/2022 18:53    Procedures Procedures   Medications Ordered in ED Medications  acetaminophen (TYLENOL) tablet 1,000 mg (1,000 mg Oral Given 06/16/22 1825)  benzonatate (TESSALON)  capsule 200 mg (200 mg Oral Given 06/16/22 2337)  albuterol (PROVENTIL) (2.5 MG/3ML) 0.083% nebulizer solution 2.5 mg (2.5 mg Nebulization Given 06/16/22 2339)  predniSONE (DELTASONE) tablet 60 mg (60 mg Oral Given 06/17/22 0003)    ED Course/ Medical Decision Making/ A&P                           Medical Decision Making Amount and/or Complexity of Data Reviewed Radiology: ordered.  Risk OTC drugs. Prescription drug management.   This patient presents to the ED for concern of shortness of breath.  Differential diagnosis includes influenza, viral URI, pneumonia, bronchitis, PE   Lab Tests:  I Ordered, and personally interpreted labs.  The pertinent results include: Positive for influenza A.   Imaging Studies ordered:  I ordered imaging studies including chest x-ray I independently visualized and interpreted imaging which showed no cardiopulmonary disease I agree  with the radiologist interpretation   Medicines ordered and prescription drug management:  I ordered medication including Tylenol, Tessalon, albuterol for fever, cough, shortness of breath Reevaluation of the patient after these medicines showed that the patient improved I have reviewed the patients home medicines and have made adjustments as needed   Problem List / ED Course:  Patient presented to the emergency department complaint of shortness of breath for several days.  During initial resuscitation, she appeared to have some significant wheezing heard across almost all lung fields.  Nebulized albuterol treatment was ordered as well at [facility] for her severe cough.  60 mg dose of prednisone was also prescribed to help reduce severity of symptoms. Given that patient is generally low risk for PE as most of her symptoms can be accounted by her current influenza status, I do not believe that workup for PE is necessary at this time.  12:21 AM Care of Curlene Dolphin transferred to Dr. Christy Gentles at the end of my  shift as the patient will require reassessment once labs/imaging have resulted. Patient presentation, ED course, and plan of care discussed with review of all pertinent labs and imaging. Please see his/her note for further details regarding further ED course and disposition. Plan at time of handoff is finish breathing treatment and reassess work of breathing with likely discharge home if patient can avoid hypoxia when ambulating. Consider admission if work of breathing is unimproved or worsened even with treatment given here. This may be altered or completely changed at the discretion of the oncoming team pending results of further workup.   Final Clinical Impression(s) / ED Diagnoses Final diagnoses:  Influenza A  Shortness of breath    Rx / DC Orders ED Discharge Orders     None         Luvenia Heller, PA-C 06/17/22 0021    Curatolo, Adam, DO 06/17/22 0101

## 2022-06-16 NOTE — ED Provider Notes (Incomplete)
[facility] EMERGENCY DEPT Provider Note   CSN: [id number] Arrival date & time: 06/16/22  1804     History Chief Complaint  Patient presents with  . Shortness of Breath    Evelyn [name] is a 61 y.o. female.   Shortness of Breath Associated symptoms: cough and headaches   Associated symptoms: no chest pain and no fever   Patient presents emergency department complaints of shortness of breath.  She reports that she has had this for several days now and was concerned because she previously had COVID-19 about 1 year ago which was very difficult for her to overcome and ultimately led to a pneumonia diagnosis.  Patient currently uses albuterol inhalers at home for asthma but denies any other pulmonary disease such as COPD, restrictive lung disease, interstitial lung disease.  Patient also reports associated headaches but she believes that this is a side effect of one of the medications that her pulmonologist has her currently taking for the asthma.  Otherwise denies fevers, chest pain, abdominal pain, nausea, vomiting, diarrhea.     Home Medications Prior to Admission medications   Medication Sig Start Date End Date Taking? Authorizing Provider  albuterol (VENTOLIN HFA) 108 (90 Base) MCG/ACT inhaler SMARTSIG:2 Puff(s) By Mouth 4 Times Daily PRN 10/10/21   [provider]  amLODipine (NORVASC) 10 MG tablet Take 1 tablet (10 mg total) by mouth daily. *PT WILL NEED TO CONTACT PCP FOR REFILLS* 05/26/22   Belva Crome, MD  aspirin 81 MG chewable tablet Chew 81 mg by mouth daily.    [provider]  Calcium Polycarbophil (FIBER) 625 MG TABS Take by mouth.    [provider]  cholecalciferol (VITAMIN D3) 25 MCG (1000 UT) tablet Take 1,000 Units by mouth daily.    [provider]  [employer] MAGNESIUM OXIDE 250 MG TABS TAKE 1 TABLET (250 MG TOTAL) BY MOUTH EVERY EVENING. TAKE WITH EVENING MEAL 04/14/21   [name], FNP  cycloSPORINE (RESTASIS) 0.05 %  ophthalmic emulsion Place 1 drop into both eyes 2 (two) times daily. 06/13/20   Briant Cedar, MD  diclofenac Sodium (VOLTAREN) 1 % GEL Apply 2 g topically 4 (four) times daily. Patient taking differently: Apply 2 g topically 4 (four) times daily as needed (pain). 07/17/19   [name], FNP  escitalopram (LEXAPRO) 20 MG tablet Take 1 tablet (20 mg total) by mouth daily. 06/14/20 02/25/22  Briant Cedar, MD  fluticasone-salmeterol (ADVAIR) 250-50 MCG/ACT AEPB Inhale 1 puff into the lungs every 12 (twelve) hours. 05/20/22   Mannam, Praveen, MD  ipratropium (ATROVENT) 0.06 % nasal spray PLACE 2 SPRAYS INTO THE NOSE 3 (THREE) TIMES DAILY. 11/13/21 11/13/22  [name], FNP  levocetirizine (XYZAL) 5 MG tablet every evening.    [provider]  losartan-hydrochlorothiazide (HYZAAR) 100-25 MG tablet TAKE 1 TABLET BY MOUTH EVERY DAY 04/30/22   [name], FNP  meloxicam (MOBIC) 15 MG tablet TAKE 1 TABLET BY MOUTH ONCE A DAY WITH FOOD FOR INFLAMMATION 09/09/20   [provider]  methylPREDNISolone (MEDROL DOSEPAK) 4 MG TBPK tablet Use per box instructions. 05/18/22   Davonna Belling, MD  Multiple Vitamins-Minerals (MULTIVITAMIN WOMENS 50+ ADV PO) Take 1 tablet by mouth daily.    [provider]  rosuvastatin (CRESTOR) 10 MG tablet Take 1 tablet (10 mg total) by mouth daily. 09/15/21   [name], FNP  spironolactone (ALDACTONE) 25 MG tablet Take 0.5 tablets (12.5 mg total) by mouth daily. 02/25/22   [name], [place]  Allergies    Lisinopril    Review of Systems   Review of Systems  Constitutional:  Negative for chills and fever.  Respiratory:  Positive for cough and shortness of breath. Negative for choking and chest tightness.   Cardiovascular:  Negative for chest pain.  Neurological:  Positive for headaches. Negative for weakness and numbness.  All other systems reviewed and are negative.   Physical Exam Updated Vital Signs BP 137/70   Pulse  (!) 118   Temp 99.2 F (37.3 C) (Oral)   Resp 20   Ht '5\' 3"'$  (1.6 m)   Wt 73.9 kg   LMP 06/22/2017   SpO2 (!) 88%   BMI 28.86 kg/m  Physical Exam Vitals and nursing note reviewed.  Constitutional:      General: She is not in acute distress.    Appearance: She is well-developed and normal weight. She is not ill-appearing.  HENT:     Head: Normocephalic and atraumatic.  Cardiovascular:     Rate and Rhythm: Regular rhythm. Tachycardia present.  Pulmonary:     Breath sounds: Examination of the right-upper field reveals wheezing. Examination of the left-upper field reveals wheezing. Examination of the right-middle field reveals wheezing. Examination of the left-middle field reveals wheezing. Wheezing present.  Neurological:     Mental Status: She is alert.     ED Results / Procedures / Treatments   Labs (all labs ordered are listed, but only abnormal results are displayed) Labs Reviewed  RESP PANEL BY RT-PCR (RSV, FLU A&B, COVID)  RVPGX2 - Abnormal; Notable for the following components:      Result Value   Influenza A by PCR POSITIVE (*)    All other components within normal limits    EKG None  Radiology DG Chest 2 View  Result Date: 06/16/2022 CLINICAL DATA:  Shortness of breath EXAM: CHEST - 2 VIEW COMPARISON:  May 18, 2022 FINDINGS: Shortness of breath.  Asthma. IMPRESSION: No active cardiopulmonary disease. Electronically Signed   By: Dorise Bullion III M.D.   On: 06/16/2022 18:53    Procedures Procedures   Medications Ordered in ED Medications  acetaminophen (TYLENOL) tablet 1,000 mg (1,000 mg Oral Given 06/16/22 1825)  benzonatate (TESSALON) capsule 200 mg (200 mg Oral Given 06/16/22 2337)  albuterol (PROVENTIL) (2.5 MG/3ML) 0.083% nebulizer solution 2.5 mg (2.5 mg Nebulization Given 06/16/22 2339)  predniSONE (DELTASONE) tablet 60 mg (60 mg Oral Given 06/17/22 0003)    ED Course/ Medical Decision Making/ A&P                           Medical Decision  Making Amount and/or Complexity of Data Reviewed Radiology: ordered.  Risk OTC drugs. Prescription drug management.   This patient presents to the ED for concern of shortness of breath.  Differential diagnosis includes influenza, viral URI, pneumonia, bronchitis, PE   Lab Tests:  I Ordered, and personally interpreted labs.  The pertinent results include: Positive for influenza A.   Imaging Studies ordered:  I ordered imaging studies including chest x-ray I independently visualized and interpreted imaging which showed no cardiopulmonary disease I agree with the radiologist interpretation   Medicines ordered and prescription drug management:  I ordered medication including Tylenol, Tessalon, albuterol for fever, cough, shortness of breath Reevaluation of the patient after these medicines showed that the patient improved I have reviewed the patients home medicines and have made adjustments as needed   Problem List / ED Course:  Patient presented to the emergency department complaint of shortness of breath for several days.  During initial resuscitation, she appeared to have some significant wheezing heard across almost all lung fields.  Nebulized albuterol treatment was ordered as well at [facility] for her severe cough.  60 mg dose of prednisone was also prescribed to help reduce severity of symptoms. Given that patient is generally low risk for PE as most of her symptoms can be accounted by her current influenza status, I do not believe that workup for PE is necessary at this time.  12:05 AM Care of Evelyn [name] transferred to Dr. Christy Gentles at the end of my shift as the patient will require reassessment once labs/imaging have resulted. Patient presentation, ED course, and plan of care discussed with review of all pertinent labs and imaging. Please see his/her note for further details regarding further ED course and disposition. Plan at time of handoff is finish breathing  treatment and reassess work of breathing with likely discharge home. Consider admission if work of breathing is unimproved or worsened even with treatment given here. This may be altered or completely changed at the discretion of the oncoming team pending results of further workup.   Final Clinical Impression(s) / ED Diagnoses Final diagnoses:  Influenza A  Shortness of breath    Rx / DC Orders ED Discharge Orders     None

## 2022-06-16 NOTE — ED Triage Notes (Signed)
Patient here POV from Home.  Endorses Non-Productive Cough, Fever, and SOB that began 2 Days ago. Worsened Yesterday.   NAD Noted during Triage. A&Ox4. GCS 15. BIB Wheelchair.

## 2022-06-16 NOTE — Telephone Encounter (Signed)
Called pt back and suggested all of TP's ([employer]) recommendations offered appt. to see NP. Set up appt. to see TP 07/06/2022. Pt gave verbal understanding. Nothing further needed.

## 2022-06-16 NOTE — Telephone Encounter (Signed)
Called pt and she states her new inhaler is making her dizzy (fluticasone-salmeterol (ADVAIR) 250-50 MCG/ACT). Also she is coughing no SOB or wheezing when blowing her nose yellowish mucous comes out. She also stated she has been using the Advair as prescribed and also is using her albuterol as well. I informed her I will send message to one of our providers and will call her back. Pt verbalized understanding.

## 2022-06-16 NOTE — ED Notes (Signed)
Pt.'s O2 sats 84% on RA when placed in room. O2 at 3l via [place] administered. Pt. C/o 7/10 pain in abdomen.

## 2022-06-17 LAB — COMPREHENSIVE METABOLIC PANEL
ALT: 17 U/L (ref 0–44)
AST: 16 U/L (ref 15–41)
Albumin: 4.5 g/dL (ref 3.5–5.0)
Alkaline Phosphatase: 53 U/L (ref 38–126)
Anion gap: 10 (ref 5–15)
BUN: 14 mg/dL (ref 6–20)
CO2: 25 mmol/L (ref 22–32)
Calcium: 9.4 mg/dL (ref 8.9–10.3)
Chloride: 101 mmol/L (ref 98–111)
Creatinine, Ser: 0.94 mg/dL (ref 0.44–1.00)
GFR, Estimated: >60 mL/min (ref >60)
Glucose, Bld: 121 mg/dL — ABNORMAL HIGH (ref 70–99)
Potassium: 3.4 mmol/L — ABNORMAL LOW (ref 3.5–5.1)
Sodium: 136 mmol/L (ref 135–145)
Total Bilirubin: 0.3 mg/dL (ref 0.3–1.2)
Total Protein: 7.5 g/dL (ref 6.5–8.1)

## 2022-06-17 LAB — CBC WITH DIFFERENTIAL/PLATELET
Abs Immature Granulocytes: 0.04 10*3/uL (ref 0.00–0.07)
Basophils Absolute: 0.1 10*3/uL (ref 0.0–0.1)
Basophils Relative: 1 %
Eosinophils Absolute: 0 10*3/uL (ref 0.0–0.5)
Eosinophils Relative: 0 %
HCT: 38.1 % (ref 36.0–46.0)
Hemoglobin: 13.1 g/dL (ref 12.0–15.0)
Immature Granulocytes: 0 %
Lymphocytes Relative: 13 %
Lymphs Abs: 1.3 10*3/uL (ref 0.7–4.0)
MCH: 28.4 pg (ref 26.0–34.0)
MCHC: 34.4 g/dL (ref 30.0–36.0)
MCV: 82.6 fL (ref 80.0–100.0)
Monocytes Absolute: 1.2 10*3/uL — ABNORMAL HIGH (ref 0.1–1.0)
Monocytes Relative: 12 %
Neutro Abs: 7.7 10*3/uL (ref 1.7–7.7)
Neutrophils Relative %: 74 %
Platelets: 257 10*3/uL (ref 150–400)
RBC: 4.61 MIL/uL (ref 3.87–5.11)
RDW: 15.1 % (ref 11.5–15.5)
WBC: 10.3 10*3/uL (ref 4.0–10.5)
nRBC: 0 % (ref 0.0–0.2)

## 2022-06-17 MED ORDER — OSELTAMIVIR PHOSPHATE 75 MG PO CAPS: 75.0000 mg | ORAL_CAPSULE | Freq: Two times a day (BID) | ORAL | 0 refills | Status: DC

## 2022-06-17 MED ORDER — ALBUTEROL (5 MG/ML) CONTINUOUS INHALATION SOLN: 10.0000 mg/h | INHALATION_SOLUTION | Freq: Once | RESPIRATORY_TRACT | Status: DC

## 2022-06-17 MED ORDER — PREDNISONE 50 MG PO TABS: ORAL_TABLET | ORAL | 0 refills | Status: DC

## 2022-06-17 MED ORDER — ALBUTEROL SULFATE HFA 108 (90 BASE) MCG/ACT IN AERS: 1.0000 | INHALATION_SPRAY | Freq: Four times a day (QID) | RESPIRATORY_TRACT | 1 refills | Status: AC | PRN

## 2022-06-17 MED ORDER — IPRATROPIUM-ALBUTEROL 0.5-2.5 (3) MG/3ML IN SOLN
3.0000 mL | Freq: Once | RESPIRATORY_TRACT | Status: AC
  Administered 2022-06-17: 3 mL via RESPIRATORY_TRACT

## 2022-06-17 MED ORDER — ALBUTEROL SULFATE (2.5 MG/3ML) 0.083% IN NEBU
10.0000 mg | INHALATION_SOLUTION | Freq: Once | RESPIRATORY_TRACT | Status: AC
  Administered 2022-06-17: 10 mg via RESPIRATORY_TRACT
  Filled 2022-06-17: qty 12

## 2022-06-17 NOTE — ED Provider Notes (Signed)
I assumed care in signout.  Plan was to give nebulized treatments, reassess. Patient was found to have influenza in the setting of asthma. After nebulized therapies, and steroids, patient is feeling improved. She did desaturate briefly after ambulating.  She was then given another nebulized treatment and feels much improved She has no increased work of breathing at rest.  Pulse ox greater than 92% at rest.  She is resting comfortably, watching television.  Patient prefers to be discharged home  Patient reports symptoms for about 2 days.  Therefore we will start Tamiflu due to her other comorbidities.  Will also continue albuterol and steroids at home  We discussed strict return precautions   Ripley Fraise, MD 06/17/22 [phone]

## 2022-06-25 ENCOUNTER — Telehealth: Payer: Self-pay

## 2022-06-30 NOTE — Telephone Encounter (Signed)
Chmg-error.  

## 2022-07-06 ENCOUNTER — Ambulatory Visit: Payer: BC Managed Care – PPO | Admitting: Adult Health

## 2022-07-08 ENCOUNTER — Ambulatory Visit: Payer: BC Managed Care – PPO | Admitting: Nurse Practitioner

## 2022-07-08 ENCOUNTER — Encounter: Payer: Self-pay | Admitting: Nurse Practitioner

## 2022-07-08 VITALS — BP 128/74 | HR 94 | Temp 98.6°F | Ht 63.0 in | Wt 161.0 lb

## 2022-07-08 DIAGNOSIS — E876 Hypokalemia: Secondary | ICD-10-CM

## 2022-07-08 DIAGNOSIS — Z8709 Personal history of other diseases of the respiratory system: Secondary | ICD-10-CM

## 2022-07-08 DIAGNOSIS — I10 Essential (primary) hypertension: Secondary | ICD-10-CM | POA: Diagnosis not present

## 2022-07-08 DIAGNOSIS — E559 Vitamin D deficiency, unspecified: Secondary | ICD-10-CM

## 2022-07-08 DIAGNOSIS — I700 Atherosclerosis of aorta: Secondary | ICD-10-CM

## 2022-07-08 DIAGNOSIS — Z720 Tobacco use: Secondary | ICD-10-CM

## 2022-07-08 DIAGNOSIS — R252 Cramp and spasm: Secondary | ICD-10-CM

## 2022-07-08 MED ORDER — ROSUVASTATIN CALCIUM 10 MG PO TABS: 10.0000 mg | ORAL_TABLET | Freq: Every day | ORAL | 1 refills | Status: DC

## 2022-07-08 MED ORDER — MAGNESIUM GLUCONATE 250 MG PO TABS: 1.0000 | ORAL_TABLET | Freq: Every day | ORAL | 2 refills | Status: DC

## 2022-07-08 NOTE — Patient Instructions (Signed)
Hypertension, Adult High blood pressure (hypertension) is when the force of blood pumping through the arteries is too strong. The arteries are the blood vessels that carry blood from the heart throughout the body. Hypertension forces the heart to work harder to pump blood and may cause arteries to become narrow or stiff. Untreated or uncontrolled hypertension can lead to a heart attack, heart failure, a stroke, kidney disease, and other problems. A blood pressure reading consists of a higher number over a lower number. Ideally, your blood pressure should be below 120/80. The first ("top") number is called the systolic pressure. It is a measure of the pressure in your arteries as your heart beats. The second ("bottom") number is called the diastolic pressure. It is a measure of the pressure in your arteries as the heart relaxes. What are the causes? The exact cause of this condition is not known. There are some conditions that result in high blood pressure. What increases the risk? Certain factors may make you more likely to develop high blood pressure. Some of these risk factors are under your control, including: Smoking. Not getting enough exercise or physical activity. Being overweight. Having too much fat, sugar, calories, or salt (sodium) in your diet. Drinking too much alcohol. Other risk factors include: Having a personal history of heart disease, diabetes, high cholesterol, or kidney disease. Stress. Having a family history of high blood pressure and high cholesterol. Having obstructive sleep apnea. Age. The risk increases with age. What are the signs or symptoms? High blood pressure may not cause symptoms. Very high blood pressure (hypertensive crisis) may cause: Headache. Fast or irregular heartbeats (palpitations). Shortness of breath. Nosebleed. Nausea and vomiting. Vision changes. Severe chest pain, dizziness, and seizures. How is this diagnosed? This condition is diagnosed by  measuring your blood pressure while you are seated, with your arm resting on a flat surface, your legs uncrossed, and your feet flat on the floor. The cuff of the blood pressure monitor will be placed directly against the skin of your upper arm at the level of your heart. Blood pressure should be measured at least twice using the same arm. Certain conditions can cause a difference in blood pressure between your right and left arms. If you have a high blood pressure reading during one visit or you have normal blood pressure with other risk factors, you may be asked to: Return on a different day to have your blood pressure checked again. Monitor your blood pressure at home for 1 week or longer. If you are diagnosed with hypertension, you may have other blood or imaging tests to help your health care provider understand your overall risk for other conditions. How is this treated? This condition is treated by making healthy lifestyle changes, such as eating healthy foods, exercising more, and reducing your alcohol intake. You may be referred for counseling on a healthy diet and physical activity. Your health care provider may prescribe medicine if lifestyle changes are not enough to get your blood pressure under control and if: Your systolic blood pressure is above 130. Your diastolic blood pressure is above 80. Your personal target blood pressure may vary depending on your medical conditions, your age, and other factors. Follow these instructions at home: Eating and drinking  Eat a diet that is high in fiber and potassium, and low in sodium, added sugar, and fat. An example of this eating plan is called the DASH diet. DASH stands for Dietary Approaches to Stop Hypertension. To eat this way: Eat   plenty of fresh fruits and vegetables. Try to fill one half of your plate at each meal with fruits and vegetables. Eat whole grains, such as whole-wheat pasta, brown rice, or whole-grain bread. Fill about one  fourth of your plate with whole grains. Eat or drink low-fat dairy products, such as skim milk or low-fat yogurt. Avoid fatty cuts of meat, processed or cured meats, and poultry with skin. Fill about one fourth of your plate with lean proteins, such as fish, chicken without skin, beans, eggs, or tofu. Avoid pre-made and processed foods. These tend to be higher in sodium, added sugar, and fat. Reduce your daily sodium intake. Many people with hypertension should eat less than 1,500 mg of sodium a day. Do not drink alcohol if: Your health care provider tells you not to drink. You are pregnant, may be pregnant, or are planning to become pregnant. If you drink alcohol: Limit how much you have to: 0-1 drink a day for women. 0-2 drinks a day for men. Know how much alcohol is in your drink. In the [place] one drink equals one 12 oz bottle of beer (355 mL), one 5 oz glass of wine (148 mL), or one 1 oz glass of hard liquor (44 mL). Lifestyle  Work with your health care provider to maintain a healthy body weight or to lose weight. Ask what an ideal weight is for you. Get at least 30 minutes of exercise that causes your heart to beat faster (aerobic exercise) most days of the week. Activities may include walking, swimming, or biking. Include exercise to strengthen your muscles (resistance exercise), such as Pilates or lifting weights, as part of your weekly exercise routine. Try to do these types of exercises for 30 minutes at least 3 days a week. Do not use any products that contain nicotine or tobacco. These products include cigarettes, chewing tobacco, and vaping devices, such as e-cigarettes. If you need help quitting, ask your health care provider. Monitor your blood pressure at home as told by your health care provider. Keep all follow-up visits. This is important. Medicines Take over-the-counter and prescription medicines only as told by your health care provider. Follow directions carefully. Blood  pressure medicines must be taken as prescribed. Do not skip doses of blood pressure medicine. Doing this puts you at risk for problems and can make the medicine less effective. Ask your health care provider about side effects or reactions to medicines that you should watch for. Contact a health care provider if you: Think you are having a reaction to a medicine you are taking. Have headaches that keep coming back (recurring). Feel dizzy. Have swelling in your ankles. Have trouble with your vision. Get help right away if you: Develop a severe headache or confusion. Have unusual weakness or numbness. Feel faint. Have severe pain in your chest or abdomen. Vomit repeatedly. Have trouble breathing. These symptoms may be an emergency. Get help right away. Call 911. Do not wait to see if the symptoms will go away. Do not drive yourself to the hospital. Summary Hypertension is when the force of blood pumping through your arteries is too strong. If this condition is not controlled, it may put you at risk for serious complications. Your personal target blood pressure may vary depending on your medical conditions, your age, and other factors. For most people, a normal blood pressure is less than 120/80. Hypertension is treated with lifestyle changes, medicines, or a combination of both. Lifestyle changes include losing weight, eating a healthy,   low-sodium diet, exercising more, and limiting alcohol. This information is not intended to replace advice given to you by your health care provider. Make sure you discuss any questions you have with your health care provider. Document Revised: 04/15/2021 Document Reviewed: 04/15/2021 Elsevier Patient Education  2023 Elsevier Inc.  

## 2022-07-08 NOTE — Progress Notes (Signed)
I,Evelyn [name],acting as a Education administrator for [employer], FNP.,have documented all relevant documentation on the behalf of Evelyn Brine, FNP,as directed by  Evelyn Brine, FNP while in the presence of Evelyn [name], [place].  Subjective:     Patient ID: Evelyn [name] , female    DOB: 1961-03-20 , 62 y.o.   MRN: [id number]   Chief Complaint  Patient presents with   Hypertension    HPI  Patient presents today for HTN follow up.   Hypertension This is a chronic problem. The current episode started more than 1 year ago. The problem has been gradually worsening since onset. The problem is uncontrolled (she has been without her blood pressure medications for one week). Pertinent negatives include no anxiety, chest pain, headaches, palpitations or shortness of breath. There are no associated agents to hypertension. Risk factors for coronary artery disease include obesity and sedentary lifestyle.     Past Medical History:  Diagnosis Date   Anxiety    Arthritis    Blood transfusion without reported diagnosis    Calcification of abdominal aorta ([facility]) 11/11/2017   Hyperlipidemia    Hypertension      Family History  Problem Relation Age of Onset   Diabetes Mother    COPD Mother    Renal Disease Mother    Heart attack Father 11   Stroke Brother    Colon cancer Neg Hx    Esophageal cancer Neg Hx    Liver cancer Neg Hx    Pancreatic cancer Neg Hx    Rectal cancer Neg Hx    Stomach cancer Neg Hx      Current Outpatient Medications:    Magnesium Gluconate 250 MG TABS, Take 1 tablet (250 mg total) by mouth daily., Disp: 30 tablet, Rfl: 2   albuterol (VENTOLIN HFA) 108 (90 Base) MCG/ACT inhaler, SMARTSIG:2 Puff(s) By Mouth 4 Times Daily PRN, Disp: , Rfl:    albuterol (VENTOLIN HFA) 108 (90 Base) MCG/ACT inhaler, Inhale 1-2 puffs into the lungs every 6 (six) hours as needed for wheezing or shortness of breath., Disp: 1 each, Rfl: 1   amLODipine (NORVASC) 10 MG tablet, Take 1 tablet (10 mg total)  by mouth daily. *PT WILL NEED TO CONTACT PCP FOR REFILLS*, Disp: 90 tablet, Rfl: 0   aspirin 81 MG chewable tablet, Chew 81 mg by mouth daily., Disp: , Rfl:    Calcium Polycarbophil (FIBER) 625 MG TABS, Take by mouth., Disp: , Rfl:    cholecalciferol (VITAMIN D3) 25 MCG (1000 UT) tablet, Take 1,000 Units by mouth daily., Disp: , Rfl:    cycloSPORINE (RESTASIS) 0.05 % ophthalmic emulsion, Place 1 drop into both eyes 2 (two) times daily., Disp: 0.4 mL, Rfl: 0   diclofenac Sodium (VOLTAREN) 1 % GEL, Apply 2 g topically 4 (four) times daily. (Patient taking differently: Apply 2 g topically 4 (four) times daily as needed (pain).), Disp: 100 g, Rfl: 2   escitalopram (LEXAPRO) 20 MG tablet, Take 1 tablet (20 mg total) by mouth daily., Disp: 30 tablet, Rfl: 0   fluticasone-salmeterol (ADVAIR) 250-50 MCG/ACT AEPB, Inhale 1 puff into the lungs every 12 (twelve) hours., Disp: 60 each, Rfl: 5   ipratropium (ATROVENT) 0.06 % nasal spray, PLACE 2 SPRAYS INTO THE NOSE 3 (THREE) TIMES DAILY., Disp: 45 mL, Rfl: 1   levocetirizine (XYZAL) 5 MG tablet, every evening., Disp: , Rfl:    losartan-hydrochlorothiazide (HYZAAR) 100-25 MG tablet, TAKE 1 TABLET BY MOUTH EVERY DAY, Disp: 90 tablet, Rfl: 1  meloxicam (MOBIC) 15 MG tablet, TAKE 1 TABLET BY MOUTH ONCE A DAY WITH FOOD FOR INFLAMMATION, Disp: , Rfl:    methylPREDNISolone (MEDROL DOSEPAK) 4 MG TBPK tablet, Use per box instructions., Disp: 21 each, Rfl: 0   Multiple Vitamins-Minerals (MULTIVITAMIN WOMENS 50+ ADV PO), Take 1 tablet by mouth daily., Disp: , Rfl:    rosuvastatin (CRESTOR) 10 MG tablet, Take 1 tablet (10 mg total) by mouth daily., Disp: 90 tablet, Rfl: 1   spironolactone (ALDACTONE) 25 MG tablet, Take 0.5 tablets (12.5 mg total) by mouth daily., Disp: 90 tablet, Rfl: 3   Allergies  Allergen Reactions   Lisinopril Other (See Comments)    Intolerance cough      Review of Systems  Constitutional: Negative.   Respiratory: Negative.  Negative for  shortness of breath.   Cardiovascular: Negative.  Negative for chest pain and palpitations.  Gastrointestinal: Negative.   Neurological: Negative.  Negative for headaches.     Today's Vitals   07/08/22 0856  BP: 128/74  Pulse: 94  Temp: 98.6 F (37 C)  TempSrc: Oral  Weight: 161 lb (73 kg)  Height: '5\' 3"'$  (1.6 m)   Body mass index is 28.52 kg/m.   Objective:  Physical Exam Vitals reviewed.  Constitutional:      General: She is not in acute distress.    Appearance: Normal appearance. She is well-developed. She is obese.  HENT:     Head: Normocephalic and atraumatic.  Eyes:     Pupils: Pupils are equal, round, and reactive to light.  Cardiovascular:     Rate and Rhythm: Normal rate and regular rhythm.     Pulses: Normal pulses.     Heart sounds: Normal heart sounds. No murmur heard. Pulmonary:     Effort: Pulmonary effort is normal. No respiratory distress.     Breath sounds: Normal breath sounds. No wheezing.  Musculoskeletal:        General: Normal range of motion.  Skin:    General: Skin is warm and dry.     Capillary Refill: Capillary refill takes less than 2 seconds.  Neurological:     General: No focal deficit present.     Mental Status: She is alert and oriented to person, place, and time.     Cranial Nerves: No cranial nerve deficit.     Motor: No weakness.  Psychiatric:        Mood and Affect: Mood normal.        Thought Content: Thought content normal.        Judgment: Judgment normal.         Assessment And Plan:     1. Essential hypertension Comments: Patient blood pressure is well-controlled.  Continue current medications. - BMP8+eGFR  2. Vitamin D deficiency Will check vitamin D [name] and supplement as needed.    Also encouraged to spend 15 minutes in the sun daily.   3. Aortic atherosclerosis (HCC) Continue current medications, tolerating medications - rosuvastatin (CRESTOR) 10 MG tablet; Take 1 tablet (10 mg total) by mouth daily.   Dispense: 90 tablet; Refill: 1  4. Hypokalemia Comments: Potassium was 3.2 when she was at ER last month, will recheck. Currently taking Spironolactone  5. Muscle cramps Comments: Will check potassium and magnesium - BMP8+eGFR - Magnesium  6. History of influenza She is doing well since having the flu  7. Tobacco abuse Comments: Declines starting medications to help with quitting smoking. Declined medications to quit smoking.  Smoking cessation instruction/counseling given:  counseled patient on the dangers of tobacco use, advised patient to stop smoking, and reviewed strategies to maximize success   Patient was given opportunity to ask questions. Patient verbalized understanding of the plan and was able to repeat key elements of the plan. All questions were answered to their satisfaction.  Evelyn Brine, FNP   I, Evelyn Brine, FNP, have reviewed all documentation for this visit. The documentation on 07/08/22 for the exam, diagnosis, procedures, and orders are all accurate and complete.   IF YOU HAVE BEEN REFERRED TO A SPECIALIST, IT MAY TAKE 1-2 WEEKS TO SCHEDULE/PROCESS THE REFERRAL. IF YOU HAVE NOT HEARD FROM US/SPECIALIST IN TWO WEEKS, PLEASE GIVE [place] A CALL AT [phone] X [phone].   THE PATIENT IS ENCOURAGED TO PRACTICE SOCIAL DISTANCING DUE TO THE COVID-19 PANDEMIC.

## 2022-07-09 LAB — BMP8+EGFR
BUN/Creatinine Ratio: 19 (ref 12–28)
BUN: 15 mg/dL (ref 8–27)
CO2: 22 mmol/L (ref 20–29)
Calcium: 10.7 mg/dL — ABNORMAL HIGH (ref 8.7–10.3)
Chloride: 101 mmol/L (ref 96–106)
Creatinine, Ser: 0.81 mg/dL (ref 0.57–1.00)
Glucose: 109 mg/dL — ABNORMAL HIGH (ref 70–99)
Potassium: 4.4 mmol/L (ref 3.5–5.2)
Sodium: 140 mmol/L (ref 134–144)
eGFR: 83 mL/min/{1.73_m2} (ref >59)

## 2022-07-09 LAB — MAGNESIUM: Magnesium: 1.7 mg/dL (ref 1.6–2.3)

## 2022-08-14 ENCOUNTER — Ambulatory Visit: Payer: BC Managed Care – PPO | Admitting: Pulmonary Disease

## 2022-08-21 ENCOUNTER — Other Ambulatory Visit: Payer: Self-pay | Admitting: Cardiology

## 2022-08-21 DIAGNOSIS — I10 Essential (primary) hypertension: Secondary | ICD-10-CM

## 2022-08-30 ENCOUNTER — Other Ambulatory Visit: Payer: Self-pay | Admitting: Nurse Practitioner

## 2022-08-30 DIAGNOSIS — I10 Essential (primary) hypertension: Secondary | ICD-10-CM

## 2022-09-04 ENCOUNTER — Other Ambulatory Visit: Payer: Self-pay | Admitting: Pulmonary Disease

## 2022-09-08 ENCOUNTER — Encounter: Payer: Self-pay | Admitting: Pulmonary Disease

## 2022-09-08 ENCOUNTER — Ambulatory Visit (INDEPENDENT_AMBULATORY_CARE_PROVIDER_SITE_OTHER): Payer: BC Managed Care – PPO | Admitting: Pulmonary Disease

## 2022-09-08 VITALS — BP 124/60 | HR 90 | Temp 98.0°F | Ht 63.0 in | Wt 165.2 lb

## 2022-09-08 DIAGNOSIS — J4540 Moderate persistent asthma, uncomplicated: Secondary | ICD-10-CM | POA: Diagnosis not present

## 2022-09-08 MED ORDER — FLUTICASONE-SALMETEROL 250-50 MCG/ACT IN AEPB: 1.0000 | INHALATION_SPRAY | Freq: Two times a day (BID) | RESPIRATORY_TRACT | 5 refills | Status: DC

## 2022-09-08 NOTE — Progress Notes (Addendum)
Evelyn [name]    [id number]    [birth year]  Primary Care Physician:Moore, Lolita Cram, FNP  Referring Physician: Arnette Felts, FNP [address] [place],  [place] [address]  Chief complaint: Follow up for cough, post COVID 19  HPI: 62 y.o.  with history of hypertension, hyperlipidemia. Develop COVID-19 in January 2023. She did not require hospitalization. However develop chronic cough and congestion since then requiring several rounds of prednisone. She went to urgent care in April and was diagnosed with pneumonia. I don't have the chest x-ray for review. A follow-up chest x-ray at the end of April by primary care shows clear lungs with no infiltrate  Chief complaint today is chronic cough which is nonproductive in nature, dyspnea with wheezing. Symptoms exacerbated at night when lying down. She denies any acid reflux but notes seasonal allergies. Symptoms usually get better when she is on short prednisone tables  Pets: no pets Occupation: Location manager at [employer] Exposures: no mold, hot tub, Jacuzzi. No further pillars or comforters. Smoking history: 13 pack year smoker. Continues to smoke half pack per day Travel history: no significant travel history Relevant family history: mom died of lung cancer. She was a smoker  Interim history: For flu infection in December 2023.  Chest x-ray at that time did not show any acute abnormality.  She was treated with Tamiflu and prednisone.  And then had bacterial pneumonia in early 2024-treated with steroid and antibiotic.  She is starting to feel better but still has some mild congestion.  Outpatient Encounter Medications as of 09/08/2022  Medication Sig   ADVAIR DISKUS 250-50 MCG/ACT AEPB INHALE 1 PUFF BY MOUTH EVERY 12 HOURS   albuterol (VENTOLIN HFA) 108 (90 Base) MCG/ACT inhaler Inhale 1-2 puffs into the lungs every 6 (six) hours as needed for wheezing or shortness of breath.   aspirin 81 MG chewable tablet  Chew 81 mg by mouth daily.   Calcium Polycarbophil (FIBER) 625 MG TABS Take by mouth.   cholecalciferol (VITAMIN D3) 25 MCG (1000 UT) tablet Take 1,000 Units by mouth daily.   cycloSPORINE (RESTASIS) 0.05 % ophthalmic emulsion Place 1 drop into both eyes 2 (two) times daily.   diclofenac Sodium (VOLTAREN) 1 % GEL Apply 2 g topically 4 (four) times daily. (Patient taking differently: Apply 2 g topically 4 (four) times daily as needed (pain).)   ipratropium (ATROVENT) 0.06 % nasal spray PLACE 2 SPRAYS INTO THE NOSE 3 (THREE) TIMES DAILY.   levocetirizine (XYZAL) 5 MG tablet every evening.   losartan-hydrochlorothiazide (HYZAAR) 100-25 MG tablet TAKE 1 TABLET BY MOUTH EVERY DAY   Magnesium Gluconate 250 MG TABS Take 1 tablet (250 mg total) by mouth daily.   meloxicam (MOBIC) 15 MG tablet TAKE 1 TABLET BY MOUTH ONCE A DAY WITH FOOD FOR INFLAMMATION   Multiple Vitamins-Minerals (MULTIVITAMIN WOMENS 50+ ADV PO) Take 1 tablet by mouth daily.   rosuvastatin (CRESTOR) 10 MG tablet Take 1 tablet (10 mg total) by mouth daily.   spironolactone (ALDACTONE) 25 MG tablet TAKE 1 TABLET (25 MG TOTAL) BY MOUTH DAILY.   [DISCONTINUED] methylPREDNISolone (MEDROL DOSEPAK) 4 MG TBPK tablet Use per box instructions.   amLODipine (NORVASC) 10 MG tablet Take 1 tablet (10 mg total) by mouth daily. *PT WILL NEED TO CONTACT PCP FOR REFILLS* (Patient not taking: Reported on 09/08/2022)   escitalopram (LEXAPRO) 20 MG tablet Take 1 tablet (20 mg total) by mouth daily.   [DISCONTINUED] albuterol (VENTOLIN  HFA) 108 (90 Base) MCG/ACT inhaler SMARTSIG:2 Puff(s) By Mouth 4 Times Daily PRN   No facility-administered encounter medications on file as of 09/08/2022.    Physical Exam: Blood pressure 124/60, pulse 90, temperature 98 F (36.7 C), temperature source Oral, height 5\' 3"  (1.6 m), weight 165 lb 3.2 oz (74.9 kg), last menstrual period 06/22/2017, SpO2 97 %. Gen:      No acute distress HEENT:  EOMI, sclera anicteric Neck:      No masses; no thyromegaly Lungs:    Clear to auscultation bilaterally; normal respiratory effort CV:         Regular rate and rhythm; no murmurs Abd:      + bowel sounds; soft, non-tender; no palpable masses, no distension Ext:    No edema; adequate peripheral perfusion Skin:      Warm and dry; no rash Neuro: alert and oriented x 3 Psych: normal mood and affect   Data Reviewed: Imaging: Chest x-ray 10/09/21 - no active cardiopulmonary disease. Chest x-ray 06/16/2022-no active cardiopulmonary disease. I have reviewed the images personally  PFTs: 01/21/2022 FVC 2.27 [70%], FEV1 1.62 [65%], F/F 71 TLC 4.93 [100%], DLCO 15.39 [78%] Normal test  Labs: CBC 11/12/2021-WBC 8.7, eos 2.9%, absolute eosinophil count 252 IgE -203  Assessment:  Moderate persistent asthma Suspect she has reactive airway disease, asthma that may be exacerbated by recent COVID infection. She has baseline allergies Continues on Advair inhaler.  She is recovering from her recent flu and pneumonia Continue current therapy  Plan/Recommendations: Continue Advair Over-the-counter Zyrtec and Claritin for chest congestion  Chilton Greathouse MD [facility] 09/08/2022, 4:13 PM  CC: Arnette Felts, FNP

## 2022-09-08 NOTE — Patient Instructions (Signed)
Glad you are improving from your recent pneumonia He can use over-the-counter Zyrtec or Claritin for sinus congestion Continue using the nasal spray and decongestants Will send in a new prescription for your Advair inhaler Follow-up in 6 months

## 2022-09-25 ENCOUNTER — Other Ambulatory Visit: Payer: Self-pay | Admitting: *Deleted

## 2022-09-25 DIAGNOSIS — I10 Essential (primary) hypertension: Secondary | ICD-10-CM

## 2022-09-28 ENCOUNTER — Telehealth: Payer: Self-pay

## 2022-09-28 DIAGNOSIS — I10 Essential (primary) hypertension: Secondary | ICD-10-CM

## 2022-09-28 MED ORDER — AMLODIPINE BESYLATE 10 MG PO TABS: 10.0000 mg | ORAL_TABLET | Freq: Every day | ORAL | 1 refills | Status: DC

## 2022-09-28 NOTE — Telephone Encounter (Signed)
Pt walked into clinic to schedule an appointment and is requesting and a refill on her Amlodipine 10mg  due to being out of medication.  Appointment has been scheduled with Lilian Coma on 11/11/2022. Amlodipine 10mg  - 1 tablet by mouth daily # 30 with RF-1 sent to [facility]. Pt will need to keep follow up appointment with PA-C.

## 2022-10-21 ENCOUNTER — Other Ambulatory Visit: Payer: Self-pay | Admitting: Physician Assistant

## 2022-10-21 DIAGNOSIS — I10 Essential (primary) hypertension: Secondary | ICD-10-CM

## 2022-10-22 ENCOUNTER — Other Ambulatory Visit: Payer: Self-pay | Admitting: Nurse Practitioner

## 2022-10-22 DIAGNOSIS — I10 Essential (primary) hypertension: Secondary | ICD-10-CM

## 2022-10-23 ENCOUNTER — Telehealth: Payer: Self-pay | Admitting: Pulmonary Disease

## 2022-10-23 NOTE — Telephone Encounter (Signed)
Completed FMLA paperwork that patient dropped by the office on 09/08/22.   Sent to Dr. Isaiah Serge for signature.

## 2022-10-30 NOTE — Telephone Encounter (Signed)
Dr. Isaiah Serge signed the FMLA form but there is no information about where to send it.   I called the patient and left a voice message asking her to call me back.  I did not leave specific details on the voice message.  I have also mailed the original signed form to her home.

## 2022-11-09 ENCOUNTER — Ambulatory Visit (INDEPENDENT_AMBULATORY_CARE_PROVIDER_SITE_OTHER): Payer: BC Managed Care – PPO | Admitting: Nurse Practitioner

## 2022-11-09 ENCOUNTER — Other Ambulatory Visit: Payer: Self-pay | Admitting: Nurse Practitioner

## 2022-11-09 ENCOUNTER — Encounter: Payer: Self-pay | Admitting: Nurse Practitioner

## 2022-11-09 VITALS — BP 120/64 | HR 95 | Temp 98.4°F | Ht 63.0 in | Wt 156.6 lb

## 2022-11-09 DIAGNOSIS — Z6827 Body mass index (BMI) 27.0-27.9, adult: Secondary | ICD-10-CM

## 2022-11-09 DIAGNOSIS — I700 Atherosclerosis of aorta: Secondary | ICD-10-CM

## 2022-11-09 DIAGNOSIS — I10 Essential (primary) hypertension: Secondary | ICD-10-CM | POA: Diagnosis not present

## 2022-11-09 DIAGNOSIS — R7309 Other abnormal glucose: Secondary | ICD-10-CM

## 2022-11-09 DIAGNOSIS — Z2821 Immunization not carried out because of patient refusal: Secondary | ICD-10-CM | POA: Diagnosis not present

## 2022-11-09 MED ORDER — AMLODIPINE BESYLATE 10 MG PO TABS: 10.0000 mg | ORAL_TABLET | Freq: Every day | ORAL | 1 refills | Status: DC

## 2022-11-09 NOTE — Progress Notes (Signed)
Evelyn [name],acting as a Neurosurgeon for Arnette Felts, FNP.,have documented all relevant documentation on the behalf of Arnette Felts, FNP,as directed by  Arnette Felts, FNP while in the presence of Arnette Felts, FNP.    Subjective:     Patient ID: Evelyn [name] , female    DOB: 04-Jan-1961 , 62 y.o.   MRN: [id number]   Chief Complaint  Patient presents with   Hypertension    HPI  Patient presents today for a bp check, patient states compliance with medications and has no other concerns today.  BP Readings from Last 3 Encounters: 11/09/22 : 120/64 09/08/22 : 124/60 07/08/22 : 128/74  Wt Readings from Last 3 Encounters: 11/09/22 : 156 lb 9.6 oz (71 kg) 09/08/22 : 165 lb 3.2 oz (74.9 kg) 07/08/22 : 161 lb (73 kg)  She is drinking smoothies once a day during the day and eating more vegetables and fresh fruit. Not exercising regularly. She does a lot of walking at work and when she works her part time at the [employer] she walks a lot.      Past Medical History:  Diagnosis Date   Anxiety    Arthritis    Blood transfusion without reported diagnosis    Calcification of abdominal aorta (HCC) 11/11/2017   Hyperlipidemia    Hypertension      Family History  Problem Relation Age of Onset   Diabetes Mother    COPD Mother    Renal Disease Mother    Heart attack Father 27   Stroke Brother    Colon cancer Neg Hx    Esophageal cancer Neg Hx    Liver cancer Neg Hx    Pancreatic cancer Neg Hx    Rectal cancer Neg Hx    Stomach cancer Neg Hx      Current Outpatient Medications:    albuterol (VENTOLIN HFA) 108 (90 Base) MCG/ACT inhaler, Inhale 1-2 puffs into the lungs every 6 (six) hours as needed for wheezing or shortness of breath., Disp: 1 each, Rfl: 1   aspirin 81 MG chewable tablet, Chew 81 mg by mouth daily., Disp: , Rfl:    Calcium Polycarbophil (FIBER) 625 MG TABS, Take by mouth., Disp: , Rfl:    cholecalciferol (VITAMIN D3) 25 MCG (1000 UT) tablet, Take 1,000 Units by  mouth daily., Disp: , Rfl:    cycloSPORINE (RESTASIS) 0.05 % ophthalmic emulsion, Place 1 drop into both eyes 2 (two) times daily., Disp: 0.4 mL, Rfl: 0   diclofenac Sodium (VOLTAREN) 1 % GEL, Apply 2 g topically 4 (four) times daily. (Patient taking differently: Apply 2 g topically 4 (four) times daily as needed (pain).), Disp: 100 g, Rfl: 2   fluticasone-salmeterol (ADVAIR DISKUS) 250-50 MCG/ACT AEPB, Inhale 1 puff into the lungs in the morning and at bedtime., Disp: 180 each, Rfl: 5   ipratropium (ATROVENT) 0.06 % nasal spray, PLACE 2 SPRAYS INTO THE NOSE 3 (THREE) TIMES DAILY., Disp: 45 mL, Rfl: 1   levocetirizine (XYZAL) 5 MG tablet, every evening., Disp: , Rfl:    losartan-hydrochlorothiazide (HYZAAR) 100-25 MG tablet, TAKE 1 TABLET BY MOUTH EVERY DAY, Disp: 90 tablet, Rfl: 1   Magnesium Gluconate 250 MG TABS, Take 1 tablet (250 mg total) by mouth daily., Disp: 30 tablet, Rfl: 2   meloxicam (MOBIC) 15 MG tablet, TAKE 1 TABLET BY MOUTH ONCE A DAY WITH FOOD FOR INFLAMMATION, Disp: , Rfl:    Multiple Vitamins-Minerals (MULTIVITAMIN WOMENS 50+ ADV PO), Take 1 tablet by mouth daily., Disp: ,  Rfl:    rosuvastatin (CRESTOR) 10 MG tablet, Take 1 tablet (10 mg total) by mouth daily., Disp: 90 tablet, Rfl: 1   spironolactone (ALDACTONE) 25 MG tablet, TAKE 1 TABLET (25 MG TOTAL) BY MOUTH DAILY., Disp: 90 tablet, Rfl: 3   amLODipine (NORVASC) 10 MG tablet, Take 1 tablet (10 mg total) by mouth daily., Disp: 90 tablet, Rfl: 1   escitalopram (LEXAPRO) 20 MG tablet, Take 1 tablet (20 mg total) by mouth daily., Disp: 30 tablet, Rfl: 0   Allergies  Allergen Reactions   Lisinopril Other (See Comments)    Intolerance cough      Review of Systems  Constitutional: Negative.   Respiratory: Negative.  Negative for shortness of breath.   Cardiovascular: Negative.  Negative for chest pain and palpitations.  Gastrointestinal: Negative.   Neurological: Negative.  Negative for headaches.     Today's Vitals    11/09/22 1607  BP: 120/64  Pulse: 95  Temp: 98.4 F (36.9 C)  TempSrc: Oral  Weight: 156 lb 9.6 oz (71 kg)  Height: 5\' 3"  (1.6 m)  PainSc: 0-No pain   Body mass index is 27.74 kg/m.  Wt Readings from Last 3 Encounters:  11/09/22 156 lb 9.6 oz (71 kg)  09/08/22 165 lb 3.2 oz (74.9 kg)  07/08/22 161 lb (73 kg)    The 10-year ASCVD risk score (Arnett DK, et al., 2019) is: 7.4%   Values used to calculate the score:     Age: 55 years     Sex: Female     Is Non-Hispanic African American: Yes     Diabetic: No     Tobacco smoker: Yes     Systolic Blood Pressure: 120 mmHg     Is BP treated: Yes     HDL Cholesterol: 85 mg/dL     Total Cholesterol: 171 mg/dL  Objective:  Physical Exam Vitals reviewed.  Constitutional:      General: She is not in acute distress.    Appearance: Normal appearance. She is well-developed.  HENT:     Head: Normocephalic and atraumatic.  Eyes:     Pupils: Pupils are equal, round, and reactive to light.  Cardiovascular:     Rate and Rhythm: Normal rate and regular rhythm.     Pulses: Normal pulses.     Heart sounds: Normal heart sounds. No murmur heard. Pulmonary:     Effort: Pulmonary effort is normal. No respiratory distress.     Breath sounds: Normal breath sounds. No wheezing.  Musculoskeletal:        General: Normal range of motion.  Skin:    General: Skin is warm and dry.     Capillary Refill: Capillary refill takes less than 2 seconds.  Neurological:     General: No focal deficit present.     Mental Status: She is alert and oriented to person, place, and time.     Cranial Nerves: No cranial nerve deficit.     Motor: No weakness.  Psychiatric:        Mood and Affect: Mood normal.        Thought Content: Thought content normal.        Judgment: Judgment normal.         Assessment And Plan:     1. Essential hypertension Comments: Blood pressure is well controlled, continue current medications. - Basic metabolic panel -  amLODipine (NORVASC) 10 MG tablet; Take 1 tablet (10 mg total) by mouth daily.  Dispense: 90 tablet; Refill:  1  2. Abnormal glucose Comments: HgbA1c was elevated at last visit, continue focusing on healthy diet. - Hemoglobin A1c  3. Calcification of abdominal aorta (HCC) Comments: Continue taking statin, tolerating well.  4. Herpes zoster vaccination declined Declines shingrix, educated on disease process and is aware if he changes his mind to notify office  5. COVID-19 vaccination declined Patient declined influenza vaccination at this time. Patient is aware that influenza vaccine prevents illness in 70% of healthy people, and reduces hospitalizations to 30-70% in elderly. This vaccine is recommended annually. Education has been provided regarding the importance of this vaccine but patient still declined. Advised may receive this vaccine at local pharmacy or Health Dept.or vaccine clinic. Aware to provide a copy of the vaccination record if obtained from local pharmacy or Health Dept.  Pt is willing to accept risk associated with refusing vaccination.  6. BMI 27.0-27.9,adult    Return if symptoms worsen or fail to improve.   Patient was given opportunity to ask questions. Patient verbalized understanding of the plan and was able to repeat key elements of the plan. All questions were answered to their satisfaction.  Arnette Felts, FNP   I, Arnette Felts, FNP, have reviewed all documentation for this visit. The documentation on 11/09/22 for the exam, diagnosis, procedures, and orders are all accurate and complete.   IF YOU HAVE BEEN REFERRED TO A SPECIALIST, IT MAY TAKE 1-2 WEEKS TO SCHEDULE/PROCESS THE REFERRAL. IF YOU HAVE NOT HEARD FROM US/SPECIALIST IN TWO WEEKS, PLEASE GIVE [place] A CALL AT [phone].   THE PATIENT IS ENCOURAGED TO PRACTICE SOCIAL DISTANCING DUE TO THE COVID-19 PANDEMIC.

## 2022-11-10 LAB — HEMOGLOBIN A1C
Est. average glucose Bld gHb Est-mCnc: 140 mg/dL
Hgb A1c MFr Bld: 6.5 % — ABNORMAL HIGH (ref 4.8–5.6)

## 2022-11-10 LAB — BASIC METABOLIC PANEL
BUN/Creatinine Ratio: 18 (ref 12–28)
BUN: 16 mg/dL (ref 8–27)
CO2: 22 mmol/L (ref 20–29)
Calcium: 10 mg/dL (ref 8.7–10.3)
Chloride: 100 mmol/L (ref 96–106)
Creatinine, Ser: 0.87 mg/dL (ref 0.57–1.00)
Glucose: 86 mg/dL (ref 70–99)
Potassium: 4.2 mmol/L (ref 3.5–5.2)
Sodium: 138 mmol/L (ref 134–144)
eGFR: 76 mL/min/{1.73_m2} (ref >59)

## 2022-11-11 ENCOUNTER — Encounter: Payer: Self-pay | Admitting: Cardiology

## 2022-11-11 ENCOUNTER — Ambulatory Visit: Payer: BC Managed Care – PPO | Attending: Physician Assistant | Admitting: Cardiology

## 2022-11-11 VITALS — BP 123/60 | HR 95 | Ht 63.0 in | Wt 154.8 lb

## 2022-11-11 DIAGNOSIS — I700 Atherosclerosis of aorta: Secondary | ICD-10-CM

## 2022-11-11 DIAGNOSIS — Z720 Tobacco use: Secondary | ICD-10-CM | POA: Diagnosis not present

## 2022-11-11 DIAGNOSIS — E785 Hyperlipidemia, unspecified: Secondary | ICD-10-CM | POA: Insufficient documentation

## 2022-11-11 DIAGNOSIS — R0683 Snoring: Secondary | ICD-10-CM

## 2022-11-11 DIAGNOSIS — I4510 Unspecified right bundle-branch block: Secondary | ICD-10-CM

## 2022-11-11 DIAGNOSIS — I10 Essential (primary) hypertension: Secondary | ICD-10-CM

## 2022-11-11 DIAGNOSIS — E7800 Pure hypercholesterolemia, unspecified: Secondary | ICD-10-CM | POA: Diagnosis not present

## 2022-11-11 NOTE — Assessment & Plan Note (Signed)
Patient continues to be a daily smoker. Discussed the significant risk this creates for developing vascular disease including CAD/risk of heart attack. Patient is not currently interested in medication assistance for cessation but has cut back number of cigarettes, currently less than a pack per day. Discussed cessation x5 min.

## 2022-11-11 NOTE — Patient Instructions (Signed)
Medication Instructions:  Your physician recommends that you continue on your current medications as directed. Please refer to the Current Medication list given to you today.  *If you need a refill on your cardiac medications before your next appointment, please call your pharmacy*   Lab Work: None ordered  If you have labs (blood work) drawn today and your tests are completely normal, you will receive your results only by: MyChart Message (if you have MyChart) OR A paper copy in the mail If you have any lab test that is abnormal or we need to change your treatment, we will call you to review the results.   Testing/Procedures: None ordered   Follow-Up: At [facility], you and your health needs are our priority.  As part of our continuing mission to provide you with exceptional heart care, we have created designated Provider Care Teams.  These Care Teams include your primary Cardiologist (physician) and Advanced Practice Providers (APPs -  Physician Assistants and Nurse Practitioners) who all work together to provide you with the care you need, when you need it.  We recommend signing up for the patient portal called "MyChart".  Sign up information is provided on this After Visit Summary.  MyChart is used to connect with patients for Virtual Visits (Telemedicine).  Patients are able to view lab/test results, encounter notes, upcoming appointments, etc.  Non-urgent messages can be sent to your provider as well.   To learn more about what you can do with MyChart, go to [email or web address].    Your next appointment:   As needed  Provider:   None     Other Instructions

## 2022-11-11 NOTE — Progress Notes (Signed)
Cardiology Office Note:    Date:  11/11/2022   ID:  Evelyn [name], DOB [birth year], MRN [id number]  PCP:  Arnette Felts, FNP  [facility] Providers Cardiologist:  None    Referring MD: Arnette Felts, FNP   Chief Complaint:  No chief complaint on file.    Patient Profile:  Right bundle branch block First observed 12/12/2018 Tobacco use Hypertension Hyperlipidemia Abdominal aortic calcification Per 04/27/12 abdominal CT "Scattered calcification along the distal abdominal aorta and its branches."  Cardiac Studies & Procedures       ECHOCARDIOGRAM  PCV ECHOCARDIOGRAM COMPLETE 01/07/2019  Narrative Echocardiogram 01/06/2019: Normal LV systolic function with EF 55%. Left ventricle cavity is normal in size. Moderate concentric hypertrophy of the left ventricle. Normal global wall motion. Doppler evidence of grade I (impaired) diastolic dysfunction, normal LAP. No significant valvular abnormalities. Inadequate TR jet to estimate pulmonary artery systolic pressure. Normal right atrial pressure.              History of Present Illness:   Evelyn [name] is a 62 y.o. female with the above problem list.  She presents today for routine follow up, was last seen by Dr. Katrinka Blazing 04/03/22. Patient previously followed by Dr. Odis Hollingshead. At this most recent visit, patient reported snoring, interrupted sleep, daytime somnolence. She was compliant with low dose statin and BP regimen that included Norvasc 10 mg/day, Hyzaar 50/12.5 mg/day, Aldactone 12.5 mg/day. Patient reported some dizziness but given BP at goal, no changes made to regimen at that time. A sleep study was ordered but was not completed.   Today patient presents for follow up/medication refills. She continues to be quite active at work, greater than 10,000 steps per day and denies chest pain, shortness of breath, lower extremity edema, fatigue, palpitations, melena, hematuria, hemoptysis, diaphoresis, weakness, presyncope, syncope,  orthopnea, and PND. Dizziness noted at previous HeartCare visit has improved and occurs very infrequently. Patient continue to smoke but has cut back to less than 1 pack per day. She is hesitant to use medications to quite but is making an effort to stop. Regarding concern for sleep apnea, patient says that per her husband, she only snores when she's consumed alcohol and has never seen apneic episodes. She denies daytime somnolence. She does continue to have difficulty staying asleep at night but generally feels rested.       Past Medical History:  Diagnosis Date   Anxiety    Arthritis    Blood transfusion without reported diagnosis    Calcification of abdominal aorta (HCC) 11/11/2017   Hyperlipidemia    Hypertension    Current Medications: Current Meds  Medication Sig   albuterol (VENTOLIN HFA) 108 (90 Base) MCG/ACT inhaler Inhale 1-2 puffs into the lungs every 6 (six) hours as needed for wheezing or shortness of breath.   amLODipine (NORVASC) 10 MG tablet Take 1 tablet (10 mg total) by mouth daily.   aspirin 81 MG chewable tablet Chew 81 mg by mouth daily.   cholecalciferol (VITAMIN D3) 25 MCG (1000 UT) tablet Take 1,000 Units by mouth daily.   fluticasone-salmeterol (ADVAIR DISKUS) 250-50 MCG/ACT AEPB Inhale 1 puff into the lungs in the morning and at bedtime.   ipratropium (ATROVENT) 0.06 % nasal spray PLACE 2 SPRAYS INTO THE NOSE 3 (THREE) TIMES DAILY.   losartan-hydrochlorothiazide (HYZAAR) 100-25 MG tablet TAKE 1 TABLET BY MOUTH EVERY DAY   meloxicam (MOBIC) 15 MG tablet TAKE 1 TABLET BY MOUTH ONCE A DAY WITH FOOD FOR INFLAMMATION  Multiple Vitamins-Minerals (MULTIVITAMIN WOMENS 50+ ADV PO) Take 1 tablet by mouth daily.   rosuvastatin (CRESTOR) 10 MG tablet Take 1 tablet (10 mg total) by mouth daily.   spironolactone (ALDACTONE) 25 MG tablet TAKE 1 TABLET (25 MG TOTAL) BY MOUTH DAILY.    Allergies:   Lisinopril   Social History   Occupational History   Occupation: Clinical cytogeneticist  Tobacco Use   Smoking status: Every Day    Packs/day: 0.25    Years: 38.00    Additional pack years: 0.00    Total pack years: 9.50    Types: Cigarettes   Smokeless tobacco: Never   Tobacco comments:     5-8 cigarette per day/lt 09/08/22  Vaping Use   Vaping Use: Never used  Substance and Sexual Activity   Alcohol use: Yes    Alcohol/week: 2.0 standard drinks of alcohol    Types: 2 Cans of beer per week    Comment: Beer 2 daily   Drug use: Not Currently    Types: Marijuana    Comment: Episodic use of marijuana   Sexual activity: Yes    Family Hx: The patient's family history includes COPD in her mother; Diabetes in her mother; Heart attack (age of onset: 99) in her father; Renal Disease in her mother; Stroke in her brother. There is no history of Colon cancer, Esophageal cancer, Liver cancer, Pancreatic cancer, Rectal cancer, or Stomach cancer.  Review of Systems  Constitutional: Negative for weight gain and weight loss.  Cardiovascular:  Negative for chest pain, dyspnea on exertion, irregular heartbeat, leg swelling, orthopnea, palpitations and syncope.  Respiratory:  Positive for snoring (only following alcohol consumption). Negative for cough and shortness of breath.   Neurological:  Positive for dizziness (rare). Negative for light-headedness and loss of balance.  All other systems reviewed and are negative.    EKGs/Labs/Other Test Reviewed:    EKG:  EKG is not ordered today.    Recent Labs: 02/25/2022: TSH 0.893 06/17/2022: ALT 17; Hemoglobin 13.1; Platelets 257 07/08/2022: Magnesium 1.7 11/09/2022: BUN 16; Creatinine, Ser 0.87; Potassium 4.2; Sodium 138   Recent Lipid Panel Recent Labs    02/25/22 1600  CHOL 171  TRIG 112  HDL 85  LDLCALC 67      Risk Assessment/Calculations/Metrics:              Physical Exam:    VS:  BP 123/60   Pulse 95   Ht 5\' 3"  (1.6 m)   Wt 154 lb 12.8 oz (70.2 kg)   LMP 06/22/2017   SpO2 98%   BMI 27.42 kg/m      Wt Readings from Last 3 Encounters:  11/11/22 154 lb 12.8 oz (70.2 kg)  11/09/22 156 lb 9.6 oz (71 kg)  09/08/22 165 lb 3.2 oz (74.9 kg)    Constitutional:      Appearance: Healthy appearance. Not in distress.  Neck:     Vascular: No JVR. JVD normal.  Pulmonary:     Effort: Pulmonary effort is normal.     Breath sounds: Normal breath sounds. No wheezing. No rhonchi. No rales.  Chest:     Chest wall: Not tender to palpatation.  Cardiovascular:     PMI at left midclavicular line. Normal rate. Regular rhythm. Normal S1. Normal S2.      Murmurs: There is no murmur.     No gallop.  No click. No rub.  Pulses:    Intact distal pulses.  Edema:    Peripheral edema  absent.  Abdominal:     General: Bowel sounds are normal.     Palpations: Abdomen is soft.     Tenderness: There is no abdominal tenderness.  Musculoskeletal: Normal range of motion.        General: No tenderness. Skin:    General: Skin is warm and dry.  Neurological:     General: No focal deficit present.     Mental Status: Alert and oriented to person, place and time.         ASSESSMENT & PLAN:   Right bundle branch block Patient with RBBB first seen on ECG 12/12/2018. TTE following initial diagnosis was without significant abnormality. ECG tracings since then have also remained stable. Discussed with patient that there is a small chance that her RBBB could progress into complete heart block at some point. No symptoms of bradycardia. Patient should monitor symptoms and have annual ECG. If there is progression of her bundle branch block, patient would need regular cardiology follow up. Currently, she is okay to see cardiology as needed.   Hypertension BP is well controlled on current regimen. Recent BMP normal. Continue Hyzaar 50-12.5mg  QD, Amlodipine 10mg , Spironolactone 25mg  with PCP management.  Calcification of abdominal aorta (HCC) Patient with "scattered calcification along the distal abdominal aorta and its  branches" on 04/27/12 abdominal CT. She is not currently taking daily ASA 81mg . Given lack of large calcium burden, unlikely to have significant risk modification as a result of ASA use. Focus should be on smoking cessation and lipid management.   Hyperlipidemia Patient's LDL 67 as of 02/25/22. This is at goal <100. Continue Crestor 10mg  as managed by PCP.   Tobacco use Patient continues to be a daily smoker. Discussed the significant risk this creates for developing vascular disease including CAD/risk of heart attack. Patient is not currently interested in medication assistance for cessation but has cut back number of cigarettes, currently less than a pack per day. Discussed cessation x5 min.   Snoring Patient previously reported snoring and difficulty sleeping, prompting a sleep study to be ordered. She has not completed this and clarifies today that snoring only occurs after drinking. No observed apneic episodes. Discussed that sleep apnea/poor sleep impacts all body systems including cardiovascular and advised patient to reconsider having sleep study if sleep duration continues to be an issue.             Dispo: As needed.  Medication Adjustments/Labs and Tests Ordered: Current medicines are reviewed at length with the patient today.  Concerns regarding medicines are outlined above.  Tests Ordered: No orders of the defined types were placed in this encounter.  Medication Changes: No orders of the defined types were placed in this encounter.  Con Memos, PA-C  11/11/2022 4:51 PM    [facility] [address] [place], [place], [place]  [address] Phone: [phone]; Fax: [phone]

## 2022-11-11 NOTE — Assessment & Plan Note (Addendum)
Patient previously reported snoring and difficulty sleeping, prompting a sleep study to be ordered. She has not completed this and clarifies today that snoring only occurs after drinking. No observed apneic episodes. Discussed that sleep apnea/poor sleep impacts all body systems including cardiovascular and advised patient to reconsider having sleep study if sleep duration continues to be an issue.

## 2022-11-11 NOTE — Assessment & Plan Note (Addendum)
BP is well controlled on current regimen. Recent BMP normal. Continue Hyzaar 50-12.5mg  QD, Amlodipine 10mg , Spironolactone 25mg  with PCP management.

## 2022-11-11 NOTE — Assessment & Plan Note (Signed)
Patient's LDL 67 as of 02/25/22. This is at goal <100. Continue Crestor 10mg  as managed by PCP.

## 2022-11-11 NOTE — Assessment & Plan Note (Addendum)
Patient with RBBB first seen on ECG 12/12/2018. TTE following initial diagnosis was without significant abnormality. ECG tracings since then have also remained stable. Discussed with patient that there is a small chance that her RBBB could progress into complete heart block at some point. No symptoms of bradycardia. Patient should monitor symptoms and have annual ECG. If there is progression of her bundle branch block, patient would need regular cardiology follow up. Currently, she is okay to see cardiology as needed.

## 2022-11-11 NOTE — Assessment & Plan Note (Signed)
Patient with "scattered calcification along the distal abdominal aorta and its branches" on 04/27/12 abdominal CT. She is not currently taking daily ASA 81mg . Given lack of large calcium burden, unlikely to have significant risk modification as a result of ASA use. Focus should be on smoking cessation and lipid management.

## 2022-11-12 NOTE — Telephone Encounter (Signed)
Returned patient's call and let her know that I did not fax a copy of FMLA paperwork to her employer because there was no fax information on the document.  Also, let her know I had mailed her the original.  She verified the address we have on file is correct, but she did not receive the document.  She gave me the number of [employer] and I tried to call.  I ended up leaving a voice message for someone named Kathrene Alu and asked for a call back with a fax#.  I have also emailed the signed form to the patient.  I also did an internet search for the employer and faxed the form to [phone].

## 2022-11-12 NOTE — Telephone Encounter (Signed)
Patient called back states fax number was on the paperwork- message sent to Darilyn in teams to reach out to patient.

## 2022-12-03 ENCOUNTER — Other Ambulatory Visit: Payer: Self-pay | Admitting: Nurse Practitioner

## 2022-12-03 DIAGNOSIS — I700 Atherosclerosis of aorta: Secondary | ICD-10-CM

## 2023-01-15 ENCOUNTER — Other Ambulatory Visit: Payer: Self-pay | Admitting: Nurse Practitioner

## 2023-01-15 DIAGNOSIS — R7309 Other abnormal glucose: Secondary | ICD-10-CM

## 2023-01-15 MED ORDER — METFORMIN HCL 500 MG PO TABS: 500.0000 mg | ORAL_TABLET | Freq: Every day | ORAL | 1 refills | Status: DC

## 2023-01-18 ENCOUNTER — Other Ambulatory Visit: Payer: Self-pay

## 2023-01-18 DIAGNOSIS — I10 Essential (primary) hypertension: Secondary | ICD-10-CM

## 2023-01-18 MED ORDER — SPIRONOLACTONE 25 MG PO TABS: 25.0000 mg | ORAL_TABLET | Freq: Every day | ORAL | 3 refills | Status: DC

## 2023-01-18 MED ORDER — AMLODIPINE BESYLATE 10 MG PO TABS: 10.0000 mg | ORAL_TABLET | Freq: Every day | ORAL | Status: DC

## 2023-01-20 ENCOUNTER — Other Ambulatory Visit: Payer: Self-pay | Admitting: Physician Assistant

## 2023-01-20 DIAGNOSIS — I10 Essential (primary) hypertension: Secondary | ICD-10-CM

## 2023-01-22 ENCOUNTER — Other Ambulatory Visit: Payer: Self-pay | Admitting: Nurse Practitioner

## 2023-01-22 DIAGNOSIS — I10 Essential (primary) hypertension: Secondary | ICD-10-CM

## 2023-03-08 ENCOUNTER — Encounter: Payer: Self-pay | Admitting: Nurse Practitioner

## 2023-03-08 ENCOUNTER — Other Ambulatory Visit: Payer: Self-pay | Admitting: Nurse Practitioner

## 2023-03-08 ENCOUNTER — Ambulatory Visit: Payer: BC Managed Care – PPO | Admitting: Nurse Practitioner

## 2023-03-08 ENCOUNTER — Encounter: Payer: BC Managed Care – PPO | Admitting: Nurse Practitioner

## 2023-03-08 VITALS — BP 120/70 | HR 93 | Temp 98.5°F | Ht 63.0 in | Wt 150.4 lb

## 2023-03-08 DIAGNOSIS — Z2821 Immunization not carried out because of patient refusal: Secondary | ICD-10-CM

## 2023-03-08 DIAGNOSIS — R7309 Other abnormal glucose: Secondary | ICD-10-CM

## 2023-03-08 DIAGNOSIS — Z0000 Encounter for general adult medical examination without abnormal findings: Secondary | ICD-10-CM | POA: Diagnosis not present

## 2023-03-08 DIAGNOSIS — Z79899 Other long term (current) drug therapy: Secondary | ICD-10-CM

## 2023-03-08 DIAGNOSIS — E559 Vitamin D deficiency, unspecified: Secondary | ICD-10-CM

## 2023-03-08 DIAGNOSIS — I700 Atherosclerosis of aorta: Secondary | ICD-10-CM

## 2023-03-08 DIAGNOSIS — T148XXA Other injury of unspecified body region, initial encounter: Secondary | ICD-10-CM

## 2023-03-08 DIAGNOSIS — I10 Essential (primary) hypertension: Secondary | ICD-10-CM | POA: Diagnosis not present

## 2023-03-08 DIAGNOSIS — Z1211 Encounter for screening for malignant neoplasm of colon: Secondary | ICD-10-CM

## 2023-03-08 LAB — POCT URINALYSIS DIP (CLINITEK)
Bilirubin, UA: NEGATIVE
Blood, UA: NEGATIVE
Glucose, UA: NEGATIVE mg/dL
Ketones, POC UA: NEGATIVE mg/dL
Leukocytes, UA: NEGATIVE
Nitrite, UA: NEGATIVE
POC PROTEIN,UA: NEGATIVE
Spec Grav, UA: 1.015 (ref 1.010–1.025)
Urobilinogen, UA: 0.2 U/dL
pH, UA: 5.5 (ref 5.0–8.0)

## 2023-03-08 MED ORDER — AMLODIPINE BESYLATE 10 MG PO TABS
10.0000 mg | ORAL_TABLET | Freq: Every day | ORAL | 0 refills | Status: DC
Start: 2023-03-08 — End: 2023-07-12

## 2023-03-08 MED ORDER — SPIRONOLACTONE 25 MG PO TABS
25.0000 mg | ORAL_TABLET | Freq: Every day | ORAL | 3 refills | Status: DC
Start: 2023-03-08 — End: 2023-03-15

## 2023-03-08 MED ORDER — MAGNESIUM 250 MG PO TABS: 1.0000 | ORAL_TABLET | Freq: Every day | ORAL | 1 refills | Status: DC

## 2023-03-08 NOTE — Progress Notes (Signed)
Madelaine Bhat, CMA,acting as a Neurosurgeon for Arnette Felts, FNP.,have documented all relevant documentation on the behalf of Arnette Felts, FNP,as directed by  Arnette Felts, FNP while in the presence of Arnette Felts, FNP.  Subjective:    Patient ID: Evelyn [name] , female    DOB: [birth year] , 62 y.o.   MRN: [id number]  Chief Complaint  Patient presents with   Annual Exam    HPI  Patient presents today for HM, patient reports compliance with medications. Patient denies any chest pain, SOB, or headaches. Patient has no concerns today.      Past Medical History:  Diagnosis Date   Anxiety    Arthritis    Blood transfusion without reported diagnosis    Calcification of abdominal aorta (HCC) 11/11/2017   Hyperlipidemia    Hypertension      Family History  Problem Relation Age of Onset   Diabetes Mother    COPD Mother    Renal Disease Mother    Heart attack Father 55   Stroke Brother    Colon cancer Neg Hx    Esophageal cancer Neg Hx    Liver cancer Neg Hx    Pancreatic cancer Neg Hx    Rectal cancer Neg Hx    Stomach cancer Neg Hx      Current Outpatient Medications:    albuterol (VENTOLIN HFA) 108 (90 Base) MCG/ACT inhaler, Inhale 1-2 puffs into the lungs every 6 (six) hours as needed for wheezing or shortness of breath., Disp: 1 each, Rfl: 1   aspirin 81 MG chewable tablet, Chew 81 mg by mouth daily., Disp: , Rfl:    cholecalciferol (VITAMIN D3) 25 MCG (1000 UT) tablet, Take 1,000 Units by mouth daily., Disp: , Rfl:    fluticasone-salmeterol (ADVAIR DISKUS) 250-50 MCG/ACT AEPB, Inhale 1 puff into the lungs in the morning and at bedtime., Disp: 180 each, Rfl: 5   losartan-hydrochlorothiazide (HYZAAR) 100-25 MG tablet, TAKE 1 TABLET BY MOUTH EVERY DAY, Disp: 90 tablet, Rfl: 1   Magnesium 250 MG TABS, Take 1 tablet (250 mg total) by mouth daily., Disp: 90 tablet, Rfl: 1   meloxicam (MOBIC) 15 MG tablet, TAKE 1 TABLET BY MOUTH ONCE A DAY WITH FOOD FOR INFLAMMATION, Disp: , Rfl:     metFORMIN (GLUCOPHAGE) 500 MG tablet, Take 1 tablet (500 mg total) by mouth daily with breakfast., Disp: 90 tablet, Rfl: 1   Multiple Vitamins-Minerals (MULTIVITAMIN WOMENS 50+ ADV PO), Take 1 tablet by mouth daily., Disp: , Rfl:    rosuvastatin (CRESTOR) 10 MG tablet, TAKE 1 TABLET BY MOUTH EVERY DAY, Disp: 90 tablet, Rfl: 2   amLODipine (NORVASC) 10 MG tablet, Take 1 tablet (10 mg total) by mouth daily., Disp: 90 tablet, Rfl: 0   ipratropium (ATROVENT) 0.06 % nasal spray, PLACE 2 SPRAYS INTO THE NOSE 3 (THREE) TIMES DAILY., Disp: 45 mL, Rfl: 1   spironolactone (ALDACTONE) 25 MG tablet, TAKE 1/2 TABLET BY MOUTH EVERY DAY, Disp: 45 tablet, Rfl: 7   Allergies  Allergen Reactions   Lisinopril Other (See Comments)    Intolerance cough  Other Reaction(s): Unknown      The patient states she is post menopausal status for birth control. Patient's last menstrual period was 06/22/2017.. Negative for Dysmenorrhea and Negative for Menorrhagia. Negative for: breast discharge, breast lump(s), breast pain and breast self exam. Associated symptoms include abnormal vaginal bleeding. Pertinent negatives include abnormal bleeding (hematology), anxiety, decreased libido, depression, difficulty falling sleep, dyspareunia, history of infertility, nocturia, sexual  dysfunction, sleep disturbances, urinary incontinence, urinary urgency, vaginal discharge and vaginal itching. Diet regular; she has cut out sweets. Eating more fresh greens - mustard, collard, kale and spinach. She is drinking smoothies.  The patient states her exercise level is minimal other than walking all day at work.   The patient's tobacco use is:  Social History   Tobacco Use  Smoking Status Every Day   Current packs/day: 0.25   Average packs/day: 0.3 packs/day for 38.0 years (9.5 ttl pk-yrs)   Types: Cigarettes  Smokeless Tobacco Never  Tobacco Comments    5-8 cigarette per day/lt 09/08/22, she is trying to quit   . She has been exposed  to passive smoke. The patient's alcohol use is:  Social History   Substance and Sexual Activity  Alcohol Use Yes   Alcohol/week: 2.0 standard drinks of alcohol   Types: 2 Cans of beer per week   Comment: Beer 2 daily   Additional information: Last pap 11/19/2021, next one scheduled for 11/19/2024.    Review of Systems  Constitutional: Negative.   HENT: Negative.    Eyes: Negative.   Respiratory: Negative.    Cardiovascular: Negative.   Gastrointestinal: Negative.   Endocrine: Negative.   Genitourinary: Negative.   Musculoskeletal: Negative.   Skin: Negative.   Allergic/Immunologic: Negative.   Neurological: Negative.   Hematological: Negative.   Psychiatric/Behavioral: Negative.       Today's Vitals   03/08/23 1506  BP: 120/70  Pulse: 93  Temp: 98.5 F (36.9 C)  SpO2: 98%  Weight: 150 lb 6.4 oz (68.2 kg)  Height: 5\' 3"  (1.6 m)   Body mass index is 26.64 kg/m.  Wt Readings from Last 3 Encounters:  03/08/23 150 lb 6.4 oz (68.2 kg)  11/11/22 154 lb 12.8 oz (70.2 kg)  11/09/22 156 lb 9.6 oz (71 kg)     Objective:  Physical Exam Vitals reviewed.  Constitutional:      General: She is not in acute distress.    Appearance: Normal appearance. She is well-developed. She is obese.  HENT:     Head: Normocephalic and atraumatic.     Right Ear: Hearing, tympanic membrane, ear canal and external ear normal. There is no impacted cerumen.     Left Ear: Hearing, tympanic membrane, ear canal and external ear normal. There is no impacted cerumen.     Nose:     Comments: Deferred - masked    Mouth/Throat:     Comments: Deferred - masked Eyes:     General: Lids are normal.     Extraocular Movements: Extraocular movements intact.     Conjunctiva/sclera: Conjunctivae normal.     Pupils: Pupils are equal, round, and reactive to light.     Funduscopic exam:    Right eye: No papilledema.        Left eye: No papilledema.  Neck:     Thyroid: No thyroid mass.     Vascular: No  carotid bruit.  Cardiovascular:     Rate and Rhythm: Normal rate and regular rhythm.     Pulses: Normal pulses.     Heart sounds: Normal heart sounds. No murmur heard. Pulmonary:     Effort: Pulmonary effort is normal.     Breath sounds: Normal breath sounds.  Chest:     Chest wall: No mass.  Breasts:    Tanner Score is 5.     Right: Normal. No mass or tenderness.     Left: Normal. No mass or tenderness.  Abdominal:     General: Abdomen is flat. Bowel sounds are normal. There is no distension.     Palpations: Abdomen is soft.     Tenderness: There is no abdominal tenderness.  Genitourinary:    Rectum: Guaiac result negative.  Musculoskeletal:        General: No swelling. Normal range of motion.     Cervical back: Full passive range of motion without pain, normal range of motion and neck supple.     Right lower leg: No edema.     Left lower leg: No edema.  Lymphadenopathy:     Upper Body:     Right upper body: No supraclavicular, axillary or pectoral adenopathy.     Left upper body: No supraclavicular, axillary or pectoral adenopathy.  Skin:    General: Skin is warm and dry.     Capillary Refill: Capillary refill takes less than 2 seconds.  Neurological:     General: No focal deficit present.     Mental Status: She is alert and oriented to person, place, and time.     Cranial Nerves: No cranial nerve deficit.     Sensory: No sensory deficit.  Psychiatric:        Mood and Affect: Mood normal.        Behavior: Behavior normal.        Thought Content: Thought content normal.        Judgment: Judgment normal.         Assessment And Plan:     Encounter for annual health examination Assessment & Plan: Behavior modifications discussed and diet history reviewed.   Pt will continue to exercise regularly and modify diet with low GI, plant based foods and decrease intake of processed foods.  Recommend intake of daily multivitamin, Vitamin D, and calcium.  Recommend mammogram  and colonoscopy for preventive screenings, as well as recommend immunizations that include influenza, TDAP, and Shingles    Essential hypertension Assessment & Plan: B/P is controlled.  CMP ordered to check renal function.  The importance of regular exercise and dietary modification was stressed to the patient.  The weight loss would help with decreasing cardiac and cancer risk as well.  EKG - -Right atrial enlargement.  -Right bundle branch block and right axis -possible right ventricular hypertrophy -consider pulmonary disease or posterior fasicular block.  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  Dispense: 90 tablet; Refill: 0  Abnormal glucose Assessment & Plan: HgbA1c is stable. Continue focusing on healthy diet and regular exercise  Orders: -     Hemoglobin A1c  Vitamin D deficiency Assessment & Plan: Will check vitamin D level and supplement as needed.    Also encouraged to spend 15 minutes in the sun daily.    Orders: -     VITAMIN D 25 Hydroxy (Vit-D Deficiency, Fractures)  Aortic atherosclerosis (HCC) Assessment & Plan: Continue Statin, tolerating well.   Orders: -     CMP14+EGFR -     Lipid panel  Encounter for screening colonoscopy Assessment & Plan: According to USPTF Colorectal cancer Screening guidelines. Colonoscopy is recommended every 10 years, starting at age 56 years. Will refer to GI for colon cancer screening.   Orders: -     Ambulatory referral  to Gastroenterology  Herpes zoster vaccination declined Assessment & Plan: Declines shingrix, educated on disease process and is aware if he changes his mind to notify office    Muscle strain Assessment & Plan: Encouraged to stretch regularly.    Other long term (current) drug therapy -     CBC with  Differential/Platelet  Other orders -     Magnesium; Take 1 tablet (250 mg total) by mouth daily.  Dispense: 90 tablet; Refill: 1     Return for 1 year physical, 6 month bp check. Patient was given opportunity to ask questions. Patient verbalized understanding of the plan and was able to repeat key elements of the plan. All questions were answered to their satisfaction.   Arnette Felts, FNP  I, Arnette Felts, FNP, have reviewed all documentation for this visit. The documentation on 03/08/23 for the exam, diagnosis, procedures, and orders are all accurate and complete.

## 2023-03-08 NOTE — Progress Notes (Deleted)
Madelaine Bhat, CMA,acting as a Neurosurgeon for Arnette Felts, FNP.,have documented all relevant documentation on the behalf of Arnette Felts, FNP,as directed by  Arnette Felts, FNP while in the presence of Arnette Felts, FNP.  Subjective:    Patient ID: Evelyn [name] , female    DOB: 11/09/1960 , 62 y.o.   MRN: [id number]  No chief complaint on file.   HPI  Patient presents today for HM, patient reports compliance with medications. Patient denies any chest pain, SOB, or headaches. Patient has no concerns today.      Past Medical History:  Diagnosis Date   Anxiety    Arthritis    Blood transfusion without reported diagnosis    Calcification of abdominal aorta (HCC) 11/11/2017   Hyperlipidemia    Hypertension      Family History  Problem Relation Age of Onset   Diabetes Mother    COPD Mother    Renal Disease Mother    Heart attack Father 53   Stroke Brother    Colon cancer Neg Hx    Esophageal cancer Neg Hx    Liver cancer Neg Hx    Pancreatic cancer Neg Hx    Rectal cancer Neg Hx    Stomach cancer Neg Hx      Current Outpatient Medications:    albuterol (VENTOLIN HFA) 108 (90 Base) MCG/ACT inhaler, Inhale 1-2 puffs into the lungs every 6 (six) hours as needed for wheezing or shortness of breath., Disp: 1 each, Rfl: 1   amLODipine (NORVASC) 10 MG tablet, Take 1 tablet (10 mg total) by mouth daily., Disp: 90 tablet, Rfl: 0   aspirin 81 MG chewable tablet, Chew 81 mg by mouth daily., Disp: , Rfl:    cholecalciferol (VITAMIN D3) 25 MCG (1000 UT) tablet, Take 1,000 Units by mouth daily., Disp: , Rfl:    fluticasone-salmeterol (ADVAIR DISKUS) 250-50 MCG/ACT AEPB, Inhale 1 puff into the lungs in the morning and at bedtime., Disp: 180 each, Rfl: 5   ipratropium (ATROVENT) 0.06 % nasal spray, PLACE 2 SPRAYS INTO THE NOSE 3 (THREE) TIMES DAILY., Disp: 45 mL, Rfl: 1   losartan-hydrochlorothiazide (HYZAAR) 100-25 MG tablet, TAKE 1 TABLET BY MOUTH EVERY DAY, Disp: 90 tablet, Rfl: 1    meloxicam (MOBIC) 15 MG tablet, TAKE 1 TABLET BY MOUTH ONCE A DAY WITH FOOD FOR INFLAMMATION, Disp: , Rfl:    metFORMIN (GLUCOPHAGE) 500 MG tablet, Take 1 tablet (500 mg total) by mouth daily with breakfast., Disp: 90 tablet, Rfl: 1   Multiple Vitamins-Minerals (MULTIVITAMIN WOMENS 50+ ADV PO), Take 1 tablet by mouth daily., Disp: , Rfl:    rosuvastatin (CRESTOR) 10 MG tablet, TAKE 1 TABLET BY MOUTH EVERY DAY, Disp: 90 tablet, Rfl: 2   spironolactone (ALDACTONE) 25 MG tablet, Take 1 tablet (25 mg total) by mouth daily., Disp: 90 tablet, Rfl: 3   Allergies  Allergen Reactions   Lisinopril Other (See Comments)    Intolerance cough  Other Reaction(s): Unknown      The patient states she uses {contraceptive methods:5051} for birth control. Patient's last menstrual period was 06/22/2017.Marland Kitchen {Dysmenorrhea-menorrhagia:21918}. Negative for: breast discharge, breast lump(s), breast pain and breast self exam. Associated symptoms include abnormal vaginal bleeding. Pertinent negatives include abnormal bleeding (hematology), anxiety, decreased libido, depression, difficulty falling sleep, dyspareunia, history of infertility, nocturia, sexual dysfunction, sleep disturbances, urinary incontinence, urinary urgency, vaginal discharge and vaginal itching. Diet regular.The patient states her exercise level is    . The patient's tobacco use is:  Social  History   Tobacco Use  Smoking Status Every Day   Current packs/day: 0.25   Average packs/day: 0.3 packs/day for 38.0 years (9.5 ttl pk-yrs)   Types: Cigarettes  Smokeless Tobacco Never  Tobacco Comments    5-8 cigarette per day/lt 09/08/22  . She has been exposed to passive smoke. The patient's alcohol use is:  Social History   Substance and Sexual Activity  Alcohol Use Yes   Alcohol/week: 2.0 standard drinks of alcohol   Types: 2 Cans of beer per week   Comment: Beer 2 daily  . Additional information: Last pap ***, next one scheduled for ***.    Review  of Systems  Constitutional: Negative.   HENT: Negative.    Eyes: Negative.   Respiratory: Negative.    Cardiovascular: Negative.   Gastrointestinal: Negative.   Endocrine: Negative.   Genitourinary: Negative.   Musculoskeletal: Negative.   Skin: Negative.   Allergic/Immunologic: Negative.   Neurological: Negative.   Hematological: Negative.   Psychiatric/Behavioral: Negative.       There were no vitals filed for this visit. There is no height or weight on file to calculate BMI.  Wt Readings from Last 3 Encounters:  11/11/22 154 lb 12.8 oz (70.2 kg)  11/09/22 156 lb 9.6 oz (71 kg)  09/08/22 165 lb 3.2 oz (74.9 kg)     Objective:  Physical Exam      Assessment And Plan:     Essential hypertension  Encounter for annual health examination  Abnormal glucose  Vitamin D deficiency     No follow-ups on file. Patient was given opportunity to ask questions. Patient verbalized understanding of the plan and was able to repeat key elements of the plan. All questions were answered to their satisfaction.   Arnette Felts, FNP  I, Arnette Felts, FNP, have reviewed all documentation for this visit. The documentation on 03/08/23 for the exam, diagnosis, procedures, and orders are all accurate and complete.

## 2023-03-09 ENCOUNTER — Encounter: Payer: Self-pay | Admitting: Internal Medicine

## 2023-03-09 LAB — MICROALBUMIN / CREATININE URINE RATIO
Creatinine, Urine: 89.1 mg/dL
Microalb/Creat Ratio: <3 mg/g{creat} (ref 0–29)
Microalbumin, Urine: <3 ug/mL

## 2023-03-11 ENCOUNTER — Ambulatory Visit: Payer: BC Managed Care – PPO

## 2023-03-11 DIAGNOSIS — Z23 Encounter for immunization: Secondary | ICD-10-CM

## 2023-03-12 LAB — LIPID PANEL
Chol/HDL Ratio: 2.1 ratio (ref 0.0–4.4)
Cholesterol, Total: 169 mg/dL (ref 100–199)
HDL: 81 mg/dL (ref >39)
LDL Chol Calc (NIH): 71 mg/dL (ref 0–99)
Triglycerides: 93 mg/dL (ref 0–149)
VLDL Cholesterol Cal: 17 mg/dL (ref 5–40)

## 2023-03-12 LAB — CMP14+EGFR
ALT: 33 IU/L — ABNORMAL HIGH (ref 0–32)
AST: 25 IU/L (ref 0–40)
Albumin: 4.8 g/dL (ref 3.8–4.9)
Alkaline Phosphatase: 75 IU/L (ref 44–121)
BUN/Creatinine Ratio: 13 (ref 12–28)
BUN: 12 mg/dL (ref 8–27)
Bilirubin Total: 0.4 mg/dL (ref 0.0–1.2)
CO2: 23 mmol/L (ref 20–29)
Calcium: 10.3 mg/dL (ref 8.7–10.3)
Chloride: 103 mmol/L (ref 96–106)
Creatinine, Ser: 0.89 mg/dL (ref 0.57–1.00)
Globulin, Total: 2.2 g/dL (ref 1.5–4.5)
Glucose: 78 mg/dL (ref 70–99)
Potassium: 3.8 mmol/L (ref 3.5–5.2)
Sodium: 142 mmol/L (ref 134–144)
Total Protein: 7 g/dL (ref 6.0–8.5)
eGFR: 74 mL/min/{1.73_m2} (ref >59)

## 2023-03-12 LAB — CBC WITH DIFFERENTIAL/PLATELET
Basophils Absolute: 0 10*3/uL (ref 0.0–0.2)
Basos: 1 %
EOS (ABSOLUTE): 0.1 10*3/uL (ref 0.0–0.4)
Eos: 2 %
Hematocrit: 42.7 % (ref 34.0–46.6)
Hemoglobin: 14.2 g/dL (ref 11.1–15.9)
Immature Grans (Abs): 0 10*3/uL (ref 0.0–0.1)
Immature Granulocytes: 0 %
Lymphocytes Absolute: 1.8 10*3/uL (ref 0.7–3.1)
Lymphs: 30 %
MCH: 28.1 pg (ref 26.6–33.0)
MCHC: 33.3 g/dL (ref 31.5–35.7)
MCV: 84 fL (ref 79–97)
Monocytes Absolute: 0.5 10*3/uL (ref 0.1–0.9)
Monocytes: 9 %
Neutrophils Absolute: 3.5 10*3/uL (ref 1.4–7.0)
Neutrophils: 58 %
Platelets: 264 10*3/uL (ref 150–450)
RBC: 5.06 x10E6/uL (ref 3.77–5.28)
RDW: 14.7 % (ref 11.7–15.4)
WBC: 6 10*3/uL (ref 3.4–10.8)

## 2023-03-12 LAB — VITAMIN D 25 HYDROXY (VIT D DEFICIENCY, FRACTURES): Vit D, 25-Hydroxy: 48.3 ng/mL (ref 30.0–100.0)

## 2023-03-12 LAB — HEMOGLOBIN A1C
Est. average glucose Bld gHb Est-mCnc: 131 mg/dL
Hgb A1c MFr Bld: 6.2 % — ABNORMAL HIGH (ref 4.8–5.6)

## 2023-03-14 ENCOUNTER — Other Ambulatory Visit: Payer: Self-pay | Admitting: Nurse Practitioner

## 2023-03-14 DIAGNOSIS — I10 Essential (primary) hypertension: Secondary | ICD-10-CM

## 2023-03-18 DIAGNOSIS — Z0000 Encounter for general adult medical examination without abnormal findings: Secondary | ICD-10-CM | POA: Insufficient documentation

## 2023-03-18 DIAGNOSIS — Z2821 Immunization not carried out because of patient refusal: Secondary | ICD-10-CM | POA: Insufficient documentation

## 2023-03-18 DIAGNOSIS — T148XXA Other injury of unspecified body region, initial encounter: Secondary | ICD-10-CM | POA: Insufficient documentation

## 2023-03-18 DIAGNOSIS — E559 Vitamin D deficiency, unspecified: Secondary | ICD-10-CM | POA: Insufficient documentation

## 2023-03-18 DIAGNOSIS — Z1211 Encounter for screening for malignant neoplasm of colon: Secondary | ICD-10-CM | POA: Insufficient documentation

## 2023-03-18 DIAGNOSIS — I10 Essential (primary) hypertension: Secondary | ICD-10-CM | POA: Insufficient documentation

## 2023-03-18 DIAGNOSIS — I700 Atherosclerosis of aorta: Secondary | ICD-10-CM | POA: Insufficient documentation

## 2023-03-18 DIAGNOSIS — R7309 Other abnormal glucose: Secondary | ICD-10-CM | POA: Insufficient documentation

## 2023-03-18 NOTE — Assessment & Plan Note (Signed)
Encouraged to stretch regularly.

## 2023-03-18 NOTE — Assessment & Plan Note (Signed)
Behavior modifications discussed and diet history reviewed.   Pt will continue to exercise regularly and modify diet with low GI, plant based foods and decrease intake of processed foods.  Recommend intake of daily multivitamin, Vitamin D, and calcium.  Recommend mammogram and colonoscopy for preventive screenings, as well as recommend immunizations that include influenza, TDAP, and Shingles  

## 2023-03-18 NOTE — Assessment & Plan Note (Signed)
According to USPTF Colorectal cancer Screening guidelines. Colonoscopy is recommended every 10 years, starting at age 61 years. Will refer to GI for colon cancer screening.

## 2023-03-18 NOTE — Assessment & Plan Note (Signed)
HgbA1c is stable. Continue focusing on healthy diet and regular exercise.

## 2023-03-18 NOTE — Assessment & Plan Note (Signed)
Will check vitamin D level and supplement as needed.    Also encouraged to spend 15 minutes in the sun daily.   

## 2023-03-18 NOTE — Assessment & Plan Note (Signed)
Declines shingrix, educated on disease process and is aware if he changes his mind to notify office  

## 2023-03-18 NOTE — Assessment & Plan Note (Signed)
B/P is controlled.  CMP ordered to check renal function.  The importance of regular exercise and dietary modification was stressed to the patient.  The weight loss would help with decreasing cardiac and cancer risk as well.  EKG - -Right atrial enlargement.  -Right bundle branch block and right axis -possible right ventricular hypertrophy -consider pulmonary disease or posterior fasicular block.  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

## 2023-03-18 NOTE — Assessment & Plan Note (Signed)
Continue Statin, tolerating well

## 2023-04-14 ENCOUNTER — Other Ambulatory Visit: Payer: Self-pay | Admitting: Nurse Practitioner

## 2023-04-14 DIAGNOSIS — R7309 Other abnormal glucose: Secondary | ICD-10-CM

## 2023-04-30 ENCOUNTER — Ambulatory Visit (AMBULATORY_SURGERY_CENTER): Payer: BC Managed Care – PPO

## 2023-04-30 VITALS — Ht 63.0 in | Wt 153.0 lb

## 2023-04-30 DIAGNOSIS — Z860100 Personal history of colon polyps, unspecified: Secondary | ICD-10-CM

## 2023-04-30 MED ORDER — NA SULFATE-K SULFATE-MG SULF 17.5-3.13-1.6 GM/177ML PO SOLN: 1.0000 | Freq: Once | ORAL | 0 refills | Status: AC

## 2023-04-30 NOTE — Progress Notes (Signed)

## 2023-05-04 ENCOUNTER — Encounter: Payer: Self-pay | Admitting: Internal Medicine

## 2023-05-05 ENCOUNTER — Telehealth: Payer: Self-pay | Admitting: Internal Medicine

## 2023-05-05 NOTE — Telephone Encounter (Signed)
Called and spoke with patient - patient agreeable to R/S procedure; procedure R/S and updated instructions sent to MyChart as well as printed and mailed to the patient per her request;

## 2023-05-05 NOTE — Telephone Encounter (Signed)
Inbound call from patient stating she received a call from [facility] today and was advised to give a call back. Patient unsure what the call was regarding. Patient requesting a call back. Please advise, thank you.

## 2023-05-14 ENCOUNTER — Encounter: Payer: BC Managed Care – PPO | Admitting: Internal Medicine

## 2023-07-09 ENCOUNTER — Ambulatory Visit (AMBULATORY_SURGERY_CENTER): Payer: BC Managed Care – PPO | Admitting: Internal Medicine

## 2023-07-09 ENCOUNTER — Encounter: Payer: Self-pay | Admitting: Internal Medicine

## 2023-07-09 VITALS — BP 102/57 | HR 81 | Temp 97.4°F | Resp 15 | Ht 63.0 in | Wt 153.0 lb

## 2023-07-09 DIAGNOSIS — D123 Benign neoplasm of transverse colon: Secondary | ICD-10-CM

## 2023-07-09 DIAGNOSIS — K648 Other hemorrhoids: Secondary | ICD-10-CM | POA: Diagnosis not present

## 2023-07-09 DIAGNOSIS — K635 Polyp of colon: Secondary | ICD-10-CM | POA: Diagnosis not present

## 2023-07-09 DIAGNOSIS — Z1211 Encounter for screening for malignant neoplasm of colon: Secondary | ICD-10-CM | POA: Diagnosis present

## 2023-07-09 DIAGNOSIS — K644 Residual hemorrhoidal skin tags: Secondary | ICD-10-CM | POA: Diagnosis not present

## 2023-07-09 DIAGNOSIS — Z860100 Personal history of colon polyps, unspecified: Secondary | ICD-10-CM

## 2023-07-09 DIAGNOSIS — D122 Benign neoplasm of ascending colon: Secondary | ICD-10-CM

## 2023-07-09 DIAGNOSIS — K5730 Diverticulosis of large intestine without perforation or abscess without bleeding: Secondary | ICD-10-CM

## 2023-07-09 MED ORDER — SODIUM CHLORIDE 0.9 % IV SOLN: 500.0000 mL | INTRAVENOUS | Status: DC

## 2023-07-09 NOTE — Op Note (Signed)
[facility] Patient Name: Evelyn [name] Procedure Date: 07/09/2023 2:50 PM MRN: [id number] Endoscopist: Beverley Fiedler , MD, [id number] Age: 63 Referring MD:  Date of Birth: 03/11/1961 Gender: Female Account #: [id number] Procedure:                Colonoscopy Indications:              High risk colon cancer surveillance: Personal                            history of non-advanced adenomas, Last colonoscopy:                            July 2019 (TA x 2) Medicines:                Monitored Anesthesia Care Procedure:                Pre-Anesthesia Assessment:                           - Prior to the procedure, a History and Physical                            was performed, and patient medications and                            allergies were reviewed. The patient's tolerance of                            previous anesthesia was also reviewed. The risks                            and benefits of the procedure and the sedation                            options and risks were discussed with the patient.                            All questions were answered, and informed consent                            was obtained. Prior Anticoagulants: The patient has                            taken no anticoagulant or antiplatelet agents. ASA                            Grade Assessment: II - A patient with mild systemic                            disease. After reviewing the risks and benefits,                            the patient was deemed in satisfactory condition to  undergo the procedure.                           After obtaining informed consent, the colonoscope                            was passed under direct vision. Throughout the                            procedure, the patient's blood pressure, pulse, and                            oxygen saturations were monitored continuously. The                            Olympus Scope SN [id number] was introduced  through the                            anus and advanced to the cecum, identified by                            appendiceal orifice and ileocecal valve. The                            colonoscopy was performed without difficulty. The                            patient tolerated the procedure well. The quality                            of the bowel preparation was good. The ileocecal                            valve, appendiceal orifice, and rectum were                            photographed. Scope In: 2:56:47 PM Scope Out: 3:11:00 PM Scope Withdrawal Time: 0 hours 11 minutes 10 seconds  Total Procedure Duration: 0 hours 14 minutes 13 seconds  Findings:                 Hemorrhoids were found on perianal exam.                           Three sessile polyps were found in the ascending                            colon. The polyps were 2 to 3 mm in size. These                            polyps were removed with a cold biopsy forceps.                            Resection and retrieval were complete.  A 3 mm polyp was found in the transverse colon. The                            polyp was sessile. The polyp was removed with a                            cold biopsy forceps. Resection and retrieval were                            complete.                           Multiple medium-mouthed and small-mouthed                            diverticula were found in the hepatic flexure,                            ascending colon and cecum.                           External and internal hemorrhoids were found during                            retroflexion and during digital exam. The                            hemorrhoids were large. Complications:            No immediate complications. Estimated Blood Loss:     Estimated blood loss was minimal. Impression:               - Hemorrhoids found on perianal exam.                           - Three 2 to 3 mm polyps in the ascending  colon,                            removed with a cold biopsy forceps. Resected and                            retrieved.                           - One 3 mm polyp in the transverse colon, removed                            with a cold biopsy forceps. Resected and retrieved.                           - Moderate diverticulosis at the hepatic flexure,                            in the ascending colon and in the cecum.                           -  Large external and small internal hemorrhoids. Recommendation:           - Patient has a contact number available for                            emergencies. The signs and symptoms of potential                            delayed complications were discussed with the                            patient. Return to normal activities tomorrow.                            Written discharge instructions were provided to the                            patient.                           - Resume previous diet.                           - Continue present medications.                           - Await pathology results.                           - Referral to Dr. Richarda Overlie for                            hemorrhoidectomy.                           - Repeat colonoscopy is recommended for                            surveillance. The colonoscopy date will be                            determined after pathology results from today's                            exam become available for review. Beverley Fiedler, MD 07/09/2023 3:16:30 PM This report has been signed electronically.

## 2023-07-09 NOTE — Patient Instructions (Signed)
Resume previous diet and medications.  Handouts provided on polyps and hemorrhoids.  Follow up colonoscopy based on biopsy results.    YOU HAD AN ENDOSCOPIC PROCEDURE TODAY AT THE [facility]:   Refer to the procedure report that was given to you for any specific questions about what was found during the examination.  If the procedure report does not answer your questions, please call your gastroenterologist to clarify.  If you requested that your care partner not be given the details of your procedure findings, then the procedure report has been included in a sealed envelope for you to review at your convenience later.  YOU SHOULD EXPECT: Some feelings of bloating in the abdomen. Passage of more gas than usual.  Walking can help get rid of the air that was put into your GI tract during the procedure and reduce the bloating. If you had a lower endoscopy (such as a colonoscopy or flexible sigmoidoscopy) you may notice spotting of blood in your stool or on the toilet paper. If you underwent a bowel prep for your procedure, you may not have a normal bowel movement for a few days.  Please Note:  You might notice some irritation and congestion in your nose or some drainage.  This is from the oxygen used during your procedure.  There is no need for concern and it should clear up in a day or so.  SYMPTOMS TO REPORT IMMEDIATELY:  Following lower endoscopy (colonoscopy or flexible sigmoidoscopy):  Excessive amounts of blood in the stool  Significant tenderness or worsening of abdominal pains  Swelling of the abdomen that is new, acute  Fever of 100F or higher  For urgent or emergent issues, a gastroenterologist can be reached at any hour by calling [phone]. Do not use MyChart messaging for urgent concerns.    DIET:  We do recommend a small meal at first, but then you may proceed to your regular diet.  Drink plenty of fluids but you should avoid alcoholic beverages for 24  hours.  ACTIVITY:  You should plan to take it easy for the rest of today and you should NOT DRIVE or use heavy machinery until tomorrow (because of the sedation medicines used during the test).    FOLLOW UP: Our staff will call the number listed on your records the next business day following your procedure.  We will call around 7:15- 8:00 am to check on you and address any questions or concerns that you may have regarding the information given to you following your procedure. If we do not reach you, we will leave a message.     If any biopsies were taken you will be contacted by phone or by letter within the next 1-3 weeks.  Please call us at [phone] if you have not heard about the biopsies in 3 weeks.    SIGNATURES/CONFIDENTIALITY: You and/or your care partner have signed paperwork which will be entered into your electronic medical record.  These signatures attest to the fact that that the information above on your After Visit Summary has been reviewed and is understood.  Full responsibility of the confidentiality of this discharge information lies with you and/or your care-partner.

## 2023-07-09 NOTE — Progress Notes (Signed)
Called to room to assist during endoscopic procedure.  Patient ID and intended procedure confirmed with present staff. Received instructions for my participation in the procedure from the performing physician.  

## 2023-07-09 NOTE — Progress Notes (Signed)
Report to PACU, RN, vss, BBS= Clear.  

## 2023-07-09 NOTE — Progress Notes (Signed)
Pt's states no medical or surgical changes since previsit or office visit. 

## 2023-07-09 NOTE — Progress Notes (Signed)
GASTROENTEROLOGY PROCEDURE H&P NOTE   Primary Care Physician: Arnette Felts, FNP    Reason for Procedure:  History of adenomatous colon polyps  Plan:    Colonoscopy  Patient is appropriate for endoscopic procedure(s) in the ambulatory ([facility]) setting.  The nature of the procedure, as well as the risks, benefits, and alternatives were carefully and thoroughly reviewed with the patient. Ample time for discussion and questions allowed. The patient understood, was satisfied, and agreed to proceed.     HPI: Evelyn [name] is a 63 y.o. female who presents for surveillance colonoscopy.  Medical history as below.  Tolerated the prep.  No recent chest pain or shortness of breath.  No abdominal pain today.  Past Medical History:  Diagnosis Date   Anxiety    Arthritis    Asthma    Blood transfusion without reported diagnosis    Calcification of abdominal aorta (HCC) 11/11/2017   Hyperlipidemia    Hypertension     Past Surgical History:  Procedure Laterality Date   CESAREAN SECTION     x2   COLONOSCOPY     FOOT SURGERY Left    Hammmer Toe   ring finger nerve  2022   TUBAL LIGATION      Prior to Admission medications   Medication Sig Start Date End Date Taking? Authorizing Provider  albuterol (VENTOLIN HFA) 108 (90 Base) MCG/ACT inhaler Inhale 1-2 puffs into the lungs every 6 (six) hours as needed for wheezing or shortness of breath. 06/17/22  Yes Zadie Rhine, MD  amLODipine (NORVASC) 10 MG tablet Take 1 tablet (10 mg total) by mouth daily. 03/08/23  Yes Arnette Felts, FNP  cholecalciferol (VITAMIN D3) 25 MCG (1000 UT) tablet Take 1,000 Units by mouth daily.   Yes [provider]  ipratropium (ATROVENT) 0.06 % nasal spray PLACE 2 SPRAYS INTO THE NOSE 3 (THREE) TIMES DAILY. 11/13/21 07/09/23 Yes Arnette Felts, FNP  losartan-hydrochlorothiazide (HYZAAR) 100-25 MG tablet TAKE 1 TABLET BY MOUTH EVERY DAY 01/25/23  Yes Arnette Felts, FNP  Multiple Vitamins-Minerals  (MULTIVITAMIN WOMENS 50+ ADV PO) Take 1 tablet by mouth daily.   Yes [provider]  rosuvastatin (CRESTOR) 10 MG tablet TAKE 1 TABLET BY MOUTH EVERY DAY 12/07/22  Yes Arnette Felts, FNP  spironolactone (ALDACTONE) 25 MG tablet TAKE 1/2 TABLET BY MOUTH EVERY DAY 03/15/23  Yes Arnette Felts, FNP  aspirin 81 MG chewable tablet Chew 81 mg by mouth daily. Patient not taking: Reported on 04/30/2023    [provider]  fluticasone-salmeterol (ADVAIR DISKUS) 250-50 MCG/ACT AEPB Inhale 1 puff into the lungs in the morning and at bedtime. 09/08/22   Mannam, Colbert Coyer, MD  Magnesium 250 MG TABS Take 1 tablet (250 mg total) by mouth daily. Patient not taking: Reported on 07/09/2023 03/08/23   Arnette Felts, FNP  meloxicam (MOBIC) 15 MG tablet TAKE 1 TABLET BY MOUTH ONCE A DAY WITH FOOD FOR INFLAMMATION 09/09/20   [provider]  metFORMIN (GLUCOPHAGE) 500 MG tablet TAKE 1 TABLET BY MOUTH EVERY DAY WITH BREAKFAST Patient not taking: Reported on 07/09/2023 04/14/23   Arnette Felts, FNP    Current Outpatient Medications  Medication Sig Dispense Refill   albuterol (VENTOLIN HFA) 108 (90 Base) MCG/ACT inhaler Inhale 1-2 puffs into the lungs every 6 (six) hours as needed for wheezing or shortness of breath. 1 each 1   amLODipine (NORVASC) 10 MG tablet Take 1 tablet (10 mg total) by mouth daily. 90 tablet 0   cholecalciferol (VITAMIN D3) 25 MCG (  1000 UT) tablet Take 1,000 Units by mouth daily.     ipratropium (ATROVENT) 0.06 % nasal spray PLACE 2 SPRAYS INTO THE NOSE 3 (THREE) TIMES DAILY. 45 mL 1   losartan-hydrochlorothiazide (HYZAAR) 100-25 MG tablet TAKE 1 TABLET BY MOUTH EVERY DAY 90 tablet 1   Multiple Vitamins-Minerals (MULTIVITAMIN WOMENS 50+ ADV PO) Take 1 tablet by mouth daily.     rosuvastatin (CRESTOR) 10 MG tablet TAKE 1 TABLET BY MOUTH EVERY DAY 90 tablet 2   spironolactone (ALDACTONE) 25 MG tablet TAKE 1/2 TABLET BY MOUTH EVERY DAY 45 tablet 7   aspirin 81 MG chewable tablet  Chew 81 mg by mouth daily. (Patient not taking: Reported on 04/30/2023)     fluticasone-salmeterol (ADVAIR DISKUS) 250-50 MCG/ACT AEPB Inhale 1 puff into the lungs in the morning and at bedtime. 180 each 5   Magnesium 250 MG TABS Take 1 tablet (250 mg total) by mouth daily. (Patient not taking: Reported on 07/09/2023) 90 tablet 1   meloxicam (MOBIC) 15 MG tablet TAKE 1 TABLET BY MOUTH ONCE A DAY WITH FOOD FOR INFLAMMATION     metFORMIN (GLUCOPHAGE) 500 MG tablet TAKE 1 TABLET BY MOUTH EVERY DAY WITH BREAKFAST (Patient not taking: Reported on 07/09/2023) 90 tablet 1   Current Facility-Administered Medications  Medication Dose Route Frequency Provider Last Rate Last Admin   0.9 %  sodium chloride infusion  500 mL Intravenous Continuous Andrw Mcguirt, Carie Caddy, MD        Allergies as of 07/09/2023 - Review Complete 07/09/2023  Allergen Reaction Noted   Lisinopril Other (See Comments) 02/02/2018    Family History  Problem Relation Age of Onset   Diabetes Mother    COPD Mother    Renal Disease Mother    Heart attack Father 83   Stroke Brother    Colon cancer Neg Hx    Esophageal cancer Neg Hx    Liver cancer Neg Hx    Pancreatic cancer Neg Hx    Rectal cancer Neg Hx    Stomach cancer Neg Hx     Social History   Socioeconomic History   Marital status: Married    Spouse name: Not on file   Number of children: 2   Years of education: Not on file   Highest education level: Not on file  Occupational History   Occupation: Location manager  Tobacco Use   Smoking status: Every Day    Current packs/day: 0.25    Average packs/day: 0.3 packs/day for 38.0 years (9.5 ttl pk-yrs)    Types: Cigarettes   Smokeless tobacco: Never   Tobacco comments:     5 cigarette per day/lt 09/08/22, she is trying to quit   Vaping Use   Vaping status: Never Used  Substance and Sexual Activity   Alcohol use: Yes    Alcohol/week: 14.0 standard drinks of alcohol    Types: 14 Cans of beer per week    Comment: Beer 2  daily   Drug use: Not Currently    Types: Marijuana    Comment: Episodic use of marijuana   Sexual activity: Yes  Other Topics Concern   Not on file  Social History Narrative   Pt lives in [place] with husband, son, nephew, and nephew's girlfriend   Social Drivers of Corporate investment banker Strain: Not on file  Food Insecurity: Not on file  Transportation Needs: Not on file  Physical Activity: Not on file  Stress: Not on file  Social Connections: Unknown (11/03/2021)  Received from [facility]   Social Network    Social Network: Not on file  Intimate Partner Violence: Unknown (09/26/2021)   Received from [facility], [facility]   HITS    Physically Hurt: Not on file    Insult or Talk Down To: Not on file    Threaten Physical Harm: Not on file    Scream or Curse: Not on file    Physical Exam: Vital signs in last 24 hours: @BP  116/78   Pulse 100   Temp (!) 97.4 F (36.3 C)   Ht 5\' 3"  (1.6 m)   Wt 153 lb (69.4 kg)   LMP 06/22/2017   SpO2 96%   BMI 27.10 kg/m  GEN: NAD EYE: Sclerae anicteric ENT: MMM CV: Non-tachycardic Pulm: CTA b/l GI: Soft, NT/ND NEURO:  Alert & Oriented x 3   Erick Blinks, MD [facility]  07/09/2023 2:51 PM

## 2023-07-11 ENCOUNTER — Other Ambulatory Visit: Payer: Self-pay | Admitting: Nurse Practitioner

## 2023-07-11 DIAGNOSIS — I10 Essential (primary) hypertension: Secondary | ICD-10-CM

## 2023-07-12 ENCOUNTER — Telehealth: Payer: Self-pay | Admitting: *Deleted

## 2023-07-12 NOTE — Telephone Encounter (Signed)
 Attempted post procedure follow up call.  No answer -LVM.

## 2023-07-14 LAB — SURGICAL PATHOLOGY

## 2023-07-16 ENCOUNTER — Encounter: Payer: Self-pay | Admitting: Internal Medicine

## 2023-07-19 ENCOUNTER — Ambulatory Visit: Payer: BC Managed Care – PPO | Admitting: Pulmonary Disease

## 2023-07-28 ENCOUNTER — Telehealth: Payer: Self-pay

## 2023-07-28 NOTE — Telephone Encounter (Signed)
 Called to check the status of the referral to Dr. Katherleen Pancoast colorectal surgery. Erica in referrals state she has reached to the patient multiple times without a response. They have closed the referral.

## 2023-08-10 ENCOUNTER — Other Ambulatory Visit: Payer: Self-pay | Admitting: Nurse Practitioner

## 2023-08-10 DIAGNOSIS — I10 Essential (primary) hypertension: Secondary | ICD-10-CM

## 2023-08-11 ENCOUNTER — Ambulatory Visit: Payer: BC Managed Care – PPO | Admitting: Pulmonary Disease

## 2023-09-06 ENCOUNTER — Ambulatory Visit: Payer: BC Managed Care – PPO | Admitting: Nurse Practitioner

## 2023-09-13 ENCOUNTER — Other Ambulatory Visit: Payer: Self-pay | Admitting: Nurse Practitioner

## 2023-09-13 DIAGNOSIS — I700 Atherosclerosis of aorta: Secondary | ICD-10-CM

## 2023-09-14 ENCOUNTER — Encounter: Payer: Self-pay | Admitting: Nurse Practitioner

## 2023-09-14 ENCOUNTER — Ambulatory Visit: Admitting: Nurse Practitioner

## 2023-09-14 VITALS — BP 122/80 | HR 89 | Temp 98.9°F | Ht 63.0 in | Wt 152.0 lb

## 2023-09-14 DIAGNOSIS — E559 Vitamin D deficiency, unspecified: Secondary | ICD-10-CM

## 2023-09-14 DIAGNOSIS — I700 Atherosclerosis of aorta: Secondary | ICD-10-CM

## 2023-09-14 DIAGNOSIS — Z79899 Other long term (current) drug therapy: Secondary | ICD-10-CM

## 2023-09-14 DIAGNOSIS — R7309 Other abnormal glucose: Secondary | ICD-10-CM

## 2023-09-14 DIAGNOSIS — Z23 Encounter for immunization: Secondary | ICD-10-CM

## 2023-09-14 DIAGNOSIS — I10 Essential (primary) hypertension: Secondary | ICD-10-CM | POA: Diagnosis not present

## 2023-09-14 DIAGNOSIS — Z720 Tobacco use: Secondary | ICD-10-CM

## 2023-09-14 MED ORDER — LOSARTAN POTASSIUM-HCTZ 100-25 MG PO TABS
ORAL_TABLET | ORAL | Status: AC
Start: 2023-09-14 — End: ?

## 2023-09-14 MED ORDER — SPIRONOLACTONE 25 MG PO TABS: 12.5000 mg | ORAL_TABLET | Freq: Every day | ORAL | 5 refills | Status: DC

## 2023-09-14 NOTE — Progress Notes (Signed)
 I,Jameka J Llittleton, CMA,acting as a Neurosurgeon for [employer], FNP.,have documented all relevant documentation on the behalf of Arnette Felts, FNP,as directed by  Arnette Felts, FNP while in the presence of Arnette Felts, FNP.  Subjective:  Patient ID: Evelyn [name] , female    DOB: 05-23-1961 , 63 y.o.   MRN: [id number]  Chief Complaint  Patient presents with   Hypertension    HPI  Patient presents today for a bp check, patient states compliance with medications and has no other concerns today.    Hypertension This is a chronic problem. The current episode started more than 1 year ago. The problem has been gradually worsening since onset. The problem is uncontrolled (she has been without her blood pressure medications for one week). Pertinent negatives include no anxiety, chest pain, headaches, palpitations or shortness of breath. There are no associated agents to hypertension. Risk factors for coronary artery disease include obesity and sedentary lifestyle.     Past Medical History:  Diagnosis Date   Anxiety    Arthritis    Asthma    Blood transfusion without reported diagnosis    Calcification of abdominal aorta (HCC) 11/11/2017   Hyperlipidemia    Hypertension      Family History  Problem Relation Age of Onset   Diabetes Mother    COPD Mother    Renal Disease Mother    Heart attack Father 79   Stroke Brother    Colon cancer Neg Hx    Esophageal cancer Neg Hx    Liver cancer Neg Hx    Pancreatic cancer Neg Hx    Rectal cancer Neg Hx    Stomach cancer Neg Hx      Current Outpatient Medications:    albuterol (VENTOLIN HFA) 108 (90 Base) MCG/ACT inhaler, Inhale 1-2 puffs into the lungs every 6 (six) hours as needed for wheezing or shortness of breath., Disp: 1 each, Rfl: 1   amLODipine (NORVASC) 10 MG tablet, TAKE 1 TABLET BY MOUTH EVERY DAY, Disp: 90 tablet, Rfl: 1   cholecalciferol (VITAMIN D3) 25 MCG (1000 UT) tablet, Take 1,000 Units by mouth daily., Disp: , Rfl:     fluticasone-salmeterol (ADVAIR DISKUS) 250-50 MCG/ACT AEPB, Inhale 1 puff into the lungs in the morning and at bedtime., Disp: 180 each, Rfl: 5   Magnesium 250 MG TABS, Take 1 tablet (250 mg total) by mouth daily., Disp: 90 tablet, Rfl: 1   meloxicam (MOBIC) 15 MG tablet, TAKE 1 TABLET BY MOUTH ONCE A DAY WITH FOOD FOR INFLAMMATION, Disp: , Rfl:    Multiple Vitamins-Minerals (MULTIVITAMIN WOMENS 50+ ADV PO), Take 1 tablet by mouth daily., Disp: , Rfl:    rosuvastatin (CRESTOR) 10 MG tablet, TAKE 1 TABLET BY MOUTH EVERY DAY, Disp: 90 tablet, Rfl: 2   aspirin 81 MG chewable tablet, Chew 81 mg by mouth daily. (Patient not taking: Reported on 09/14/2023), Disp: , Rfl:    ipratropium (ATROVENT) 0.06 % nasal spray, PLACE 2 SPRAYS INTO THE NOSE 3 (THREE) TIMES DAILY., Disp: 45 mL, Rfl: 1   losartan-hydrochlorothiazide (HYZAAR) 100-25 MG tablet, Take 1/2 tab by mouth daily, Disp: , Rfl:    spironolactone (ALDACTONE) 25 MG tablet, Take 0.5 tablets (12.5 mg total) by mouth daily., Disp: 45 tablet, Rfl: 5   Allergies  Allergen Reactions   Lisinopril Other (See Comments)    Intolerance cough  Other Reaction(s): Unknown     Review of Systems  Constitutional: Negative.   Eyes: Negative.   Respiratory: Negative.  Negative for shortness of breath.   Cardiovascular: Negative.  Negative for chest pain and palpitations.  Musculoskeletal: Negative.   Skin: Negative.   Neurological: Negative.  Negative for headaches.  Psychiatric/Behavioral: Negative.       Today's Vitals   09/14/23 1550  BP: 122/80  Pulse: 89  Temp: 98.9 F (37.2 C)  TempSrc: Oral  Weight: 152 lb (68.9 kg)  Height: 5\' 3"  (1.6 m)  PainSc: 0-No pain   Body mass index is 26.93 kg/m.  Wt Readings from Last 3 Encounters:  09/14/23 152 lb (68.9 kg)  07/09/23 153 lb (69.4 kg)  04/30/23 153 lb (69.4 kg)     Objective:  Physical Exam Vitals and nursing note reviewed.  Constitutional:      General: She is not in acute  distress.    Appearance: Normal appearance. She is well-developed.  HENT:     Head: Normocephalic and atraumatic.  Eyes:     Pupils: Pupils are equal, round, and reactive to light.  Cardiovascular:     Rate and Rhythm: Normal rate and regular rhythm.     Pulses: Normal pulses.     Heart sounds: Normal heart sounds. No murmur heard. Pulmonary:     Effort: Pulmonary effort is normal. No respiratory distress.     Breath sounds: Normal breath sounds. No wheezing.  Musculoskeletal:        General: Normal range of motion.  Skin:    General: Skin is warm and dry.     Capillary Refill: Capillary refill takes less than 2 seconds.  Neurological:     General: No focal deficit present.     Mental Status: She is alert and oriented to person, place, and time.     Cranial Nerves: No cranial nerve deficit.     Motor: No weakness.  Psychiatric:        Mood and Affect: Mood normal.        Thought Content: Thought content normal.        Judgment: Judgment normal.         Assessment And Plan:  Essential hypertension Assessment & Plan: B/P is controlled.  CMP ordered to check renal function.  The importance of regular exercise and dietary modification was stressed to the patient.   Orders: -     CMP14+EGFR -     Spironolactone; Take 0.5 tablets (12.5 mg total) by mouth daily.  Dispense: 45 tablet; Refill: 5 -     Losartan Potassium-HCTZ; Take 1/2 tab by mouth daily  Abnormal glucose Assessment & Plan: HgbA1c is stable. Continue focusing on healthy diet and regular exercise  Orders: -     CMP14+EGFR -     Hemoglobin A1c  Aortic atherosclerosis (HCC) Assessment & Plan: Continue Statin, tolerating well.   Orders: -     Lipid panel  Need for zoster vaccination Assessment & Plan: Shingrix #1 given in office.  Orders: -     Varicella-zoster vaccine IM  Vitamin D deficiency Assessment & Plan: Will check vitamin D level and supplement as needed.    Also encouraged to spend 15  minutes in the sun daily.     Tobacco abuse Assessment & Plan: Smoking cessation instruction/counseling given:  counseled patient on the dangers of tobacco use, advised patient to stop smoking, and reviewed strategies to maximize success    Other long term (current) drug therapy -     CBC    Return in about 4 months (around 01/14/2024) for BPC.  Patient was  given opportunity to ask questions. Patient verbalized understanding of the plan and was able to repeat key elements of the plan. All questions were answered to their satisfaction.    Jeanell Sparrow, FNP, have reviewed all documentation for this visit. The documentation on 09/14/23 for the exam, diagnosis, procedures, and orders are all accurate and complete.  IF YOU HAVE BEEN REFERRED TO A SPECIALIST, IT MAY TAKE 1-2 WEEKS TO SCHEDULE/PROCESS THE REFERRAL. IF YOU HAVE NOT HEARD FROM US/SPECIALIST IN TWO WEEKS, PLEASE GIVE [place] A CALL AT [phone].

## 2023-09-14 NOTE — Patient Instructions (Signed)
 Hypertension, Adult Hypertension is another name for high blood pressure. High blood pressure forces your heart to work harder to pump blood. This can cause problems over time. There are two numbers in a blood pressure reading. There is a top number (systolic) over a bottom number (diastolic). It is best to have a blood pressure that is below 120/80. What are the causes? The cause of this condition is not known. Some other conditions can lead to high blood pressure. What increases the risk? Some lifestyle factors can make you more likely to develop high blood pressure: Smoking. Not getting enough exercise or physical activity. Being overweight. Having too much fat, sugar, calories, or salt (sodium) in your diet. Drinking too much alcohol. Other risk factors include: Having any of these conditions: Heart disease. Diabetes. High cholesterol. Kidney disease. Obstructive sleep apnea. Having a family history of high blood pressure and high cholesterol. Age. The risk increases with age. Stress. What are the signs or symptoms? High blood pressure may not cause symptoms. Very high blood pressure (hypertensive crisis) may cause: Headache. Fast or uneven heartbeats (palpitations). Shortness of breath. Nosebleed. Vomiting or feeling like you may vomit (nauseous). Changes in how you see. Very bad chest pain. Feeling dizzy. Seizures. How is this treated? This condition is treated by making healthy lifestyle changes, such as: Eating healthy foods. Exercising more. Drinking less alcohol. Your doctor may prescribe medicine if lifestyle changes do not help enough and if: Your top number is above 130. Your bottom number is above 80. Your personal target blood pressure may vary. Follow these instructions at home: Eating and drinking  If told, follow the DASH eating plan. To follow this plan: Fill one half of your plate at each meal with fruits and vegetables. Fill one fourth of your plate  at each meal with whole grains. Whole grains include whole-wheat pasta, brown rice, and whole-grain bread. Eat or drink low-fat dairy products, such as skim milk or low-fat yogurt. Fill one fourth of your plate at each meal with low-fat (lean) proteins. Low-fat proteins include fish, chicken without skin, eggs, beans, and tofu. Avoid fatty meat, cured and processed meat, or chicken with skin. Avoid pre-made or processed food. Limit the amount of salt in your diet to less than 1,500 mg each day. Do not drink alcohol if: Your doctor tells you not to drink. You are pregnant, may be pregnant, or are planning to become pregnant. If you drink alcohol: Limit how much you have to: 0-1 drink a day for women. 0-2 drinks a day for men. Know how much alcohol is in your drink. In the [place] one drink equals one 12 oz bottle of beer (355 mL), one 5 oz glass of wine (148 mL), or one 1 oz glass of hard liquor (44 mL). Lifestyle  Work with your doctor to stay at a healthy weight or to lose weight. Ask your doctor what the best weight is for you. Get at least 30 minutes of exercise that causes your heart to beat faster (aerobic exercise) most days of the week. This may include walking, swimming, or biking. Get at least 30 minutes of exercise that strengthens your muscles (resistance exercise) at least 3 days a week. This may include lifting weights or doing Pilates. Do not smoke or use any products that contain nicotine or tobacco. If you need help quitting, ask your doctor. Check your blood pressure at home as told by your doctor. Keep all follow-up visits. Medicines Take over-the-counter and prescription medicines  only as told by your doctor. Follow directions carefully. Do not skip doses of blood pressure medicine. The medicine does not work as well if you skip doses. Skipping doses also puts you at risk for problems. Ask your doctor about side effects or reactions to medicines that you should watch  for. Contact a doctor if: You think you are having a reaction to the medicine you are taking. You have headaches that keep coming back. You feel dizzy. You have swelling in your ankles. You have trouble with your vision. Get help right away if: You get a very bad headache. You start to feel mixed up (confused). You feel weak or numb. You feel faint. You have very bad pain in your: Chest. Belly (abdomen). You vomit more than once. You have trouble breathing. These symptoms may be an emergency. Get help right away. Call 911. Do not wait to see if the symptoms will go away. Do not drive yourself to the hospital. Summary Hypertension is another name for high blood pressure. High blood pressure forces your heart to work harder to pump blood. For most people, a normal blood pressure is less than 120/80. Making healthy choices can help lower blood pressure. If your blood pressure does not get lower with healthy choices, you may need to take medicine. This information is not intended to replace advice given to you by your health care provider. Make sure you discuss any questions you have with your health care provider. Document Revised: 03/27/2021 Document Reviewed: 03/27/2021 Elsevier Patient Education  2024 [employer].

## 2023-09-15 LAB — CMP14+EGFR
ALT: 24 IU/L (ref 0–32)
AST: 18 IU/L (ref 0–40)
Albumin: 4.8 g/dL (ref 3.9–4.9)
Alkaline Phosphatase: 86 IU/L (ref 44–121)
BUN/Creatinine Ratio: 13 (ref 12–28)
BUN: 10 mg/dL (ref 8–27)
Bilirubin Total: 0.4 mg/dL (ref 0.0–1.2)
CO2: 23 mmol/L (ref 20–29)
Calcium: 10.2 mg/dL (ref 8.7–10.3)
Chloride: 102 mmol/L (ref 96–106)
Creatinine, Ser: 0.8 mg/dL (ref 0.57–1.00)
Globulin, Total: 2.5 g/dL (ref 1.5–4.5)
Glucose: 82 mg/dL (ref 70–99)
Potassium: 3.6 mmol/L (ref 3.5–5.2)
Sodium: 143 mmol/L (ref 134–144)
Total Protein: 7.3 g/dL (ref 6.0–8.5)
eGFR: 84 mL/min/{1.73_m2} (ref >59)

## 2023-09-15 LAB — HEMOGLOBIN A1C
Est. average glucose Bld gHb Est-mCnc: 126 mg/dL
Hgb A1c MFr Bld: 6 % — ABNORMAL HIGH (ref 4.8–5.6)

## 2023-09-15 LAB — CBC
Hematocrit: 44.4 % (ref 34.0–46.6)
Hemoglobin: 14.9 g/dL (ref 11.1–15.9)
MCH: 28.1 pg (ref 26.6–33.0)
MCHC: 33.6 g/dL (ref 31.5–35.7)
MCV: 84 fL (ref 79–97)
Platelets: 313 10*3/uL (ref 150–450)
RBC: 5.3 x10E6/uL — ABNORMAL HIGH (ref 3.77–5.28)
RDW: 14.6 % (ref 11.7–15.4)
WBC: 6.7 10*3/uL (ref 3.4–10.8)

## 2023-09-15 LAB — LIPID PANEL
Chol/HDL Ratio: 2.3 ratio (ref 0.0–4.4)
Cholesterol, Total: 196 mg/dL (ref 100–199)
HDL: 84 mg/dL (ref >39)
LDL Chol Calc (NIH): 96 mg/dL (ref 0–99)
Triglycerides: 90 mg/dL (ref 0–149)
VLDL Cholesterol Cal: 16 mg/dL (ref 5–40)

## 2023-09-26 ENCOUNTER — Encounter: Payer: Self-pay | Admitting: Nurse Practitioner

## 2023-09-26 DIAGNOSIS — Z23 Encounter for immunization: Secondary | ICD-10-CM | POA: Insufficient documentation

## 2023-09-26 NOTE — Assessment & Plan Note (Signed)
 Shingrix # 1 given in office

## 2023-09-26 NOTE — Assessment & Plan Note (Signed)
 B/P is controlled.  CMP ordered to check renal function.  The importance of regular exercise and dietary modification was stressed to the patient.

## 2023-09-26 NOTE — Assessment & Plan Note (Signed)
 Smoking cessation instruction/counseling given:  counseled patient on the dangers of tobacco use, advised patient to stop smoking, and reviewed strategies to maximize success

## 2023-09-26 NOTE — Assessment & Plan Note (Signed)
 Will check vitamin D level and supplement as needed.    Also encouraged to spend 15 minutes in the sun daily.

## 2023-09-26 NOTE — Assessment & Plan Note (Signed)
 Continue Statin, tolerating well

## 2023-09-26 NOTE — Assessment & Plan Note (Signed)
HgbA1c is stable. Continue focusing on healthy diet and regular exercise.

## 2023-10-13 ENCOUNTER — Encounter: Payer: Self-pay | Admitting: Pulmonary Disease

## 2023-10-13 ENCOUNTER — Ambulatory Visit: Payer: BC Managed Care – PPO | Admitting: Pulmonary Disease

## 2023-10-13 VITALS — BP 122/70 | HR 85 | Temp 98.5°F | Ht 63.0 in | Wt 151.6 lb

## 2023-10-13 DIAGNOSIS — J45909 Unspecified asthma, uncomplicated: Secondary | ICD-10-CM

## 2023-10-13 DIAGNOSIS — J4540 Moderate persistent asthma, uncomplicated: Secondary | ICD-10-CM

## 2023-10-13 DIAGNOSIS — R059 Cough, unspecified: Secondary | ICD-10-CM

## 2023-10-13 DIAGNOSIS — R109 Unspecified abdominal pain: Secondary | ICD-10-CM

## 2023-10-13 MED ORDER — FLUTICASONE-SALMETEROL 250-50 MCG/ACT IN AEPB: 1.0000 | INHALATION_SPRAY | Freq: Two times a day (BID) | RESPIRATORY_TRACT | 3 refills | Status: AC

## 2023-10-13 NOTE — Patient Instructions (Signed)
 VISIT SUMMARY:  Today, you came in for a follow-up visit and medication refill. We discussed your asthma management and stomach pain. You mentioned that you have been out of your asthma medication, Advair, and have been using your rescue inhaler more frequently. You also reported experiencing stomach pain when bending over, which was previously attributed to inflammation.  YOUR PLAN:  -ASTHMA: Asthma is a condition where your airways narrow and swell, making it difficult to breathe. Your asthma has been well-managed with Advair, but you have experienced increased symptoms this week due to running out of the medication. A prescription for [facility]air has been sent to [facility] on [address]. Please pick it up promptly to prevent worsening of your symptoms. Remember to call for refills before you run out to avoid interruptions in your therapy.  -STOMACH PAIN: Your stomach pain when bending over was previously thought to be due to inflammation, and meloxicam was recommended for this issue. You should discuss with your primary care physician about getting a refill of meloxicam.  INSTRUCTIONS:  Please pick up your [facility] from [facility] [address] as soon as possible. Also, contact your primary care physician to discuss a refill for meloxicam. If you have any questions or if your symptoms worsen, please call our office.

## 2023-10-13 NOTE — Progress Notes (Signed)
 Evelyn [name]    [id number]    Sep 02, 1960  Primary Care Physician:Moore, Abelino Able, FNP  Referring Physician: Susanna Epley, FNP [address] [place],  [place] [address]  Chief complaint: Follow up for cough, post COVID 19  HPI: 63 y.o.  with history of hypertension, hyperlipidemia. Develop COVID-19 in January 2023. She did not require hospitalization. However develop chronic cough and congestion since then requiring several rounds of prednisone . She went to urgent care in April and was diagnosed with pneumonia. I don't have the chest x-ray for review. A follow-up chest x-ray at the end of April by primary care shows clear lungs with no infiltrate  Chief complaint today is chronic cough which is nonproductive in nature, dyspnea with wheezing. Symptoms exacerbated at night when lying down. She denies any acid reflux but notes seasonal allergies. Symptoms usually get better when she is on short prednisone  tables  Pets: no pets Occupation: Location manager at [employer] Exposures: no mold, hot tub, Jacuzzi. No further pillars or comforters. Smoking history: 13 pack year smoker. Continues to smoke half pack per day Travel history: no significant travel history Relevant family history: mom died of lung cancer. She was a smoker  Interim history: Discussed the use of AI scribe software for clinical note transcription with the patient, who gave verbal consent to proceed.  History of Present Illness Evelyn [name] is a 63 year old female with asthma who presents for follow-up and medication refill.  Her asthma management is currently suboptimal due to running out of Advair, which she usually takes as one puff twice a day. She has been off Advair since last Friday or Saturday and has increased her use of the rescue inhaler as a result. She experiences wheezing, which she attributes to being out of Advair.  She experiences stomach pain when bending over, which was  previously attributed to inflammation. She was advised to take meloxicam for this issue. She last saw her regular doctor in March and requested a refill of meloxicam, but it was not provided. No other problems related to this issue have been reported.    Outpatient Encounter Medications as of 10/13/2023  Medication Sig   albuterol  (VENTOLIN  HFA) 108 (90 Base) MCG/ACT inhaler Inhale 1-2 puffs into the lungs every 6 (six) hours as needed for wheezing or shortness of breath.   amLODipine  (NORVASC ) 10 MG tablet TAKE 1 TABLET BY MOUTH EVERY DAY   cholecalciferol  (VITAMIN D3) 25 MCG (1000 UT) tablet Take 1,000 Units by mouth daily.   fluticasone -salmeterol (ADVAIR DISKUS) 250-50 MCG/ACT AEPB Inhale 1 puff into the lungs in the morning and at bedtime.   losartan -hydrochlorothiazide  (HYZAAR) 100-25 MG tablet Take 1/2 tab by mouth daily   meloxicam (MOBIC) 15 MG tablet TAKE 1 TABLET BY MOUTH ONCE A DAY WITH FOOD FOR INFLAMMATION   Multiple Vitamins-Minerals (MULTIVITAMIN WOMENS 50+ ADV PO) Take 1 tablet by mouth daily.   rosuvastatin  (CRESTOR ) 10 MG tablet TAKE 1 TABLET BY MOUTH EVERY DAY   spironolactone  (ALDACTONE ) 25 MG tablet Take 0.5 tablets (12.5 mg total) by mouth daily.   ipratropium (ATROVENT ) 0.06 % nasal spray PLACE 2 SPRAYS INTO THE NOSE 3 (THREE) TIMES DAILY.   [DISCONTINUED] aspirin  81 MG chewable tablet Chew 81 mg by mouth daily. (Patient not taking: Reported on 09/14/2023)   [DISCONTINUED] Magnesium  250 MG TABS Take 1 tablet (250 mg total) by mouth daily. (Patient not taking: Reported on 10/13/2023)   No  facility-administered encounter medications on file as of 10/13/2023.    Physical Exam: Blood pressure 122/70, pulse 85, temperature 98.5 F (36.9 C), temperature source Oral, height 5\' 3"  (1.6 m), weight 151 lb 9.6 oz (68.8 kg), last menstrual period 06/22/2017, SpO2 98%. Gen:      No acute distress HEENT:  EOMI, sclera anicteric Neck:     No masses; no thyromegaly Lungs:   Mild  expiratory wheeze CV:         Regular rate and rhythm; no murmurs Abd:      + bowel sounds; soft, non-tender; no palpable masses, no distension Ext:    No edema; adequate peripheral perfusion Skin:      Warm and dry; no rash Neuro: alert and oriented x 3 Psych: normal mood and affect   Data Reviewed: Imaging: Chest x-ray 10/09/21 - no active cardiopulmonary disease. Chest x-ray 06/16/2022-no active cardiopulmonary disease. I have reviewed the images personally  PFTs: 01/21/2022 FVC 2.27 [70%], FEV1 1.62 [65%], F/F 71 TLC 4.93 [100%], DLCO 15.39 [78%] Normal test  Labs: CBC 11/12/2021-WBC 8.7, eos 2.9%, absolute eosinophil count 252 IgE -203 Assessment & Plan Asthma Asthma is well-managed with Advair, but increased use of the rescue inhaler this week due to running out of Advair. Mild wheezing likely due to interruption in Advair therapy. - [facility] [address]. - Advise prompt pickup of Advair to prevent exacerbation. - Instruct to call for refills before depletion to avoid therapy interruption.  Stomach pain Intermittent stomach pain when bending over. Previous assessment suggested inflammation, and meloxicam was recommended. No recent refill of meloxicam obtained. - Discuss with primary care physician regarding meloxicam refill.    Plan/Recommendations: Continue Advair Over-the-counter Zyrtec and Claritin for chest congestion  Phyllis Breeze MD [facility] 10/13/2023, 3:49 PM  CC: Susanna Epley, FNP

## 2023-12-27 ENCOUNTER — Ambulatory Visit

## 2023-12-27 ENCOUNTER — Ambulatory Visit (INDEPENDENT_AMBULATORY_CARE_PROVIDER_SITE_OTHER): Admitting: Podiatry

## 2023-12-27 ENCOUNTER — Ambulatory Visit (INDEPENDENT_AMBULATORY_CARE_PROVIDER_SITE_OTHER)

## 2023-12-27 DIAGNOSIS — M79671 Pain in right foot: Secondary | ICD-10-CM

## 2023-12-27 DIAGNOSIS — M79672 Pain in left foot: Secondary | ICD-10-CM

## 2023-12-27 DIAGNOSIS — M2041 Other hammer toe(s) (acquired), right foot: Secondary | ICD-10-CM | POA: Diagnosis not present

## 2023-12-27 DIAGNOSIS — M7751 Other enthesopathy of right foot: Secondary | ICD-10-CM

## 2023-12-27 MED ORDER — DEXAMETHASONE SODIUM PHOSPHATE 120 MG/30ML IJ SOLN
2.0000 mg | Freq: Once | INTRAMUSCULAR | Status: AC
Start: 2023-12-27 — End: 2023-12-27
  Administered 2023-12-27: 2 mg

## 2023-12-27 NOTE — Progress Notes (Signed)
 Patient presents with complaint of pain in the fifth toe right greater than left.  Also complains of pain over the bunion on the right foot.  She had a hammertoe and bunion correction on the left foot back in the mid 90s.  She has been having more pain on the fifth toe especially on the right and also with the bunion.  She has tried different shoes and bunion padding and arrangements remain painful.  Has not tried any padding on the fifth toes.   Physical exam:  General appearance: Pleasant, and in no acute distress. AOx3.  Vascular: Pedal pulses: DP 2/4 bilaterally, PT 2/4 bilaterally. No edema lower legs bilaterally. Capillary fill time immediate b/l.  Neurological: Light touch intact feet bilaterally.  Normal Achilles reflex bilaterally.  No clonus or spasticity noted.  Dermatologic:   Skin normal temperature bilaterally.  Skin normal color, tone, and texture bilaterally.   Musculoskeletal: Hammertoe fifth toe bilaterally with tenderness right greater than left.  Hallux abductovalgus right with tenderness over the dorsal medial aspect of the first metatarsal phalangeal joint.  No tenderness with range of motion of the first MTP right.  Radiographs: 3 views right foot: Hammertoe fifth toe.  Hallux abductovalgus moderate.  Nones any bone tumors or fractures.  Normal bone density.  Diagnosis: 1.  Pain right foot 2.  Hammertoe fifth toe right 3.  Bursitis right foot.  Plan: -New patient office visit level 3 for evaluation and management modifier 25. -Discussed the hammertoe deformities fifth toes bilaterally as well as the hallux abductovalgus right.  I will try some sleeves for the fifth toes.  Also try an injection on the right toe.  Debrided the lesion calluses on the fifth toes bilaterally we will see how she does with this.  She may ultimately need surgery for the hallux abductovalgus deformity on the right and the hammertoe fifth toe right -injected 1.0cc mixture of 0.5cc 0.5%  Marcaine and  0.5cc Dexamethasone  sodium phosphate  USP at [place] at dorsal aspect of proximal inter phalangeal joint fifth toe right. -Dispensed padded sleeves to the fifth toes bilaterally   Return 2 follow-up injection

## 2024-01-11 ENCOUNTER — Ambulatory Visit (INDEPENDENT_AMBULATORY_CARE_PROVIDER_SITE_OTHER): Admitting: Podiatry

## 2024-01-11 DIAGNOSIS — M2011 Hallux valgus (acquired), right foot: Secondary | ICD-10-CM

## 2024-01-11 DIAGNOSIS — M2041 Other hammer toe(s) (acquired), right foot: Secondary | ICD-10-CM

## 2024-01-11 DIAGNOSIS — M79671 Pain in right foot: Secondary | ICD-10-CM

## 2024-01-11 NOTE — Progress Notes (Signed)
 Patient presents today for follow-up injection fifth toe inflamed bursa and padding on the toe.  She continues to have pain with the toe it is no better.  Got no relief with the injection or with the padding and trimming of hyperkeratotic lesion.  Still having pain at the bunion site also.  She has tried changes in shoes and padding and this has not relieved the pain at the bunion or the hammertoe fifth to right   Physical exam:  General appearance: Pleasant, and in no acute distress. AOx3.  Vascular: Pedal pulses: DP 2/4 bilaterally, PT 2/4 bilaterally.  Mild edema lower legs bilaterally. Capillary fill time immediate.  Neurological: Light touch intact feet bilaterally.  Normal Achilles reflex bilaterally.    Dermatologic:   Skin normal temperature bilaterally.  Skin normal color, tone, and texture bilaterally.   Musculoskeletal: Ha no tenderness with range of motion of the first MTP mmertoe fifth toe right with tenderness at the proximal inner phalangeal joint dorsally.  Hallux abductovalgus deformity with tenderness at the dorsal medial and medial aspect of the first metatarsal phalangeal joint right.   Diagnosis: Painful hallux abductovalgus right Painful hammertoe fifth toe right  Plan: -Established office visit for evaluation and management level 3 - Discussed with her continued pain in fifth toe despite injection and padding.  Also having pain at the bunion site on the right foot.  Discussed with her surgical correction of these.  Will get her set up for the surgical consult with our doctors given the lack lack of response to conservative treatment  Return next available surgical consult for correction HAV right and correction hammertoe fifth toe right

## 2024-01-17 ENCOUNTER — Ambulatory Visit: Admitting: Nurse Practitioner

## 2024-03-08 ENCOUNTER — Ambulatory Visit: Payer: BC Managed Care – PPO | Admitting: Nurse Practitioner

## 2024-03-08 ENCOUNTER — Encounter: Payer: Self-pay | Admitting: Nurse Practitioner

## 2024-03-08 VITALS — BP 130/68 | HR 84 | Temp 98.8°F | Ht 63.0 in | Wt 151.2 lb

## 2024-03-08 DIAGNOSIS — R7309 Other abnormal glucose: Secondary | ICD-10-CM

## 2024-03-08 DIAGNOSIS — Z6826 Body mass index (BMI) 26.0-26.9, adult: Secondary | ICD-10-CM

## 2024-03-08 DIAGNOSIS — I10 Essential (primary) hypertension: Secondary | ICD-10-CM | POA: Diagnosis not present

## 2024-03-08 DIAGNOSIS — Z23 Encounter for immunization: Secondary | ICD-10-CM | POA: Diagnosis not present

## 2024-03-08 DIAGNOSIS — E782 Mixed hyperlipidemia: Secondary | ICD-10-CM

## 2024-03-08 DIAGNOSIS — E559 Vitamin D deficiency, unspecified: Secondary | ICD-10-CM

## 2024-03-08 DIAGNOSIS — Z2821 Immunization not carried out because of patient refusal: Secondary | ICD-10-CM

## 2024-03-08 DIAGNOSIS — Z0000 Encounter for general adult medical examination without abnormal findings: Secondary | ICD-10-CM | POA: Diagnosis not present

## 2024-03-08 DIAGNOSIS — Z139 Encounter for screening, unspecified: Secondary | ICD-10-CM

## 2024-03-08 DIAGNOSIS — R252 Cramp and spasm: Secondary | ICD-10-CM

## 2024-03-08 DIAGNOSIS — Z79899 Other long term (current) drug therapy: Secondary | ICD-10-CM

## 2024-03-08 DIAGNOSIS — Z720 Tobacco use: Secondary | ICD-10-CM

## 2024-03-08 LAB — POCT URINALYSIS DIP (CLINITEK)
Bilirubin, UA: NEGATIVE
Blood, UA: NEGATIVE
Glucose, UA: NEGATIVE mg/dL
Ketones, POC UA: NEGATIVE mg/dL
Leukocytes, UA: NEGATIVE
Nitrite, UA: NEGATIVE
POC PROTEIN,UA: NEGATIVE
Spec Grav, UA: 1.01 (ref 1.010–1.025)
Urobilinogen, UA: 0.2 U/dL
pH, UA: 6 (ref 5.0–8.0)

## 2024-03-08 NOTE — Progress Notes (Signed)
 LILLETTE Kristeen JINNY Gladis, CMA,acting as a Neurosurgeon for Evelyn Ada, FNP.,have documented all relevant documentation on the behalf of Evelyn Ada, FNP,as directed by  Evelyn Ada, FNP while in the presence of Evelyn Ada, FNP.  Subjective:    Patient ID: Evelyn [name] , female    DOB: 01/17/1961 , 63 y.o.   MRN: [id number]  Chief Complaint  Patient presents with   Annual Exam    Patient presents today for HM, Patient reports compliance with medication. Patient denies any chest pain, SOB, or headaches. Patient has no concerns today.      HPI  Discussed the use of AI scribe software for clinical note transcription with the patient, who gave verbal consent to proceed.  History of Present Illness Evelyn [name] is a 63 year old female who presents for an annual physical exam.  She is in menopause and denies abnormal vaginal bleeding.  She has a history of smoking since age 31 and currently smokes two cigarettes per day. She experiences a persistent cough, which she attributes to a past COVID-19 infection that progressed to bronchitis, pneumonia, and asthma. She has not had a CT scan of her lungs.  She experiences frequent cramping in her fingers, toes, legs, and feet, which occurs consistently. She notes a history of low potassium levels. 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  Her diet is low in meat and bread, focusing on vegetables and fruits, including spinach and lettuce wraps instead of bread, and a high intake of salads. Despite efforts to lose weight, including fasting and drinking water, she has gained one pound since last month. She walks extensively at work.  No swelling in her feet or ankles, constipation, diarrhea, blurred vision, or dizziness. She has dry eyes and uses glasses for reading. She does not consume sweets but craves salt, leading to potato chip consumption.   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   Hyperlipidemia    Hypertension      Family History  Problem Relation Age of Onset   Diabetes Mother    COPD Mother    Renal Disease Mother    Heart attack Father 54   Stroke Brother    Colon cancer Neg Hx    Esophageal cancer Neg Hx    Liver cancer Neg Hx    Pancreatic cancer Neg Hx    Rectal cancer Neg Hx    Stomach cancer Neg Hx      Current Outpatient Medications:    albuterol  (VENTOLIN  HFA) 108 (90 Base) MCG/ACT inhaler, Inhale 1-2 puffs into the lungs every 6 (six) hours as needed for wheezing or shortness of breath., Disp: 1 each, Rfl: 1   amLODipine  (NORVASC ) 10 MG tablet, TAKE 1 TABLET BY MOUTH EVERY DAY, Disp: 90 tablet, Rfl: 1   cholecalciferol  (VITAMIN D3) 25 MCG (1000 UT) tablet, Take 1,000 Units by mouth daily., Disp: , Rfl:    fluticasone -salmeterol (ADVAIR DISKUS) 250-50 MCG/ACT AEPB, Inhale 1 puff into the lungs in the morning and at bedtime., Disp: 180 each, Rfl: 3   ipratropium (ATROVENT ) 0.06 % nasal spray, PLACE 2 SPRAYS INTO THE NOSE 3 (THREE) TIMES DAILY., Disp: 45 mL, Rfl: 1   losartan -hydrochlorothiazide  (HYZAAR) 100-25 MG tablet, Take 1/2 tab by mouth daily, Disp: , Rfl:    meloxicam (  MOBIC) 15 MG tablet, TAKE 1 TABLET BY MOUTH ONCE A DAY WITH FOOD FOR INFLAMMATION, Disp: , Rfl:    Multiple Vitamins-Minerals (MULTIVITAMIN WOMENS 50+ ADV PO), Take 1 tablet by mouth daily., Disp: , Rfl:    rosuvastatin  (CRESTOR ) 10 MG tablet, TAKE 1 TABLET BY MOUTH EVERY DAY, Disp: 90 tablet, Rfl: 2   spironolactone  (ALDACTONE ) 25 MG tablet, Take 0.5 tablets (12.5 mg total) by mouth daily., Disp: 45 tablet, Rfl: 5   Allergies  Allergen Reactions   Lisinopril Other (See Comments)    Intolerance cough  Other Reaction(s): Unknown      The patient states she uses post menopausal status for birth control. Patient's last menstrual period was 06/22/2017.. Negative for  Dysmenorrhea and Negative for Menorrhagia. Negative for: breast discharge, breast lump(s), breast pain and breast self exam. Associated symptoms include abnormal vaginal bleeding. Pertinent negatives include abnormal bleeding (hematology), anxiety, decreased libido, depression, difficulty falling sleep, dyspareunia, history of infertility, nocturia, sexual dysfunction, sleep disturbances, urinary incontinence, urinary urgency, vaginal discharge and vaginal itching. Diet regular.The patient states her exercise level is    . The patient's tobacco use is:  Social History   Tobacco Use  Smoking Status Every Day   Current packs/day: 0.25   Average packs/day: 0.3 packs/day for 38.0 years (9.5 ttl pk-yrs)   Types: Cigarettes  Smokeless Tobacco Never  Tobacco Comments    5 cigarette per day/lt 09/08/22, she is trying to quit, she has smoked 2 cigarettes today  She has been exposed to passive smoke. The patient's alcohol use is:  Social History   Substance and Sexual Activity  Alcohol Use Yes   Alcohol/week: 14.0 standard drinks of alcohol   Types: 14 Cans of beer per week   Comment: Beer 2 daily  Additional information: Last pap 2023, next one scheduled for 2026.    Review of Systems  Constitutional: Negative.   HENT: Negative.    Eyes: Negative.   Respiratory: Negative.    Cardiovascular: Negative.   Gastrointestinal: Negative.   Endocrine: Negative.   Genitourinary: Negative.   Musculoskeletal: Negative.   Skin: Negative.   Allergic/Immunologic: Negative.   Neurological: Negative.   Hematological: Negative.   Psychiatric/Behavioral: Negative.       Today's Vitals   03/08/24 1414  BP: 130/68  Pulse: 84  Temp: 98.8 F (37.1 C)  TempSrc: Oral  Weight: 151 lb 3.2 oz (68.6 kg)  Height: 5' 3 (1.6 m)  PainSc: 0-No pain   Body mass index is 26.78 kg/m.  Wt Readings from Last 3 Encounters:  03/08/24 151 lb 3.2 oz (68.6 kg)  10/13/23 151 lb 9.6 oz (68.8 kg)  09/14/23 152 lb  (68.9 kg)     Objective:  Physical Exam Vitals and nursing note reviewed.  Constitutional:      General: She is not in acute distress.    Appearance: Normal appearance. She is well-developed. She is obese.  HENT:     Head: Normocephalic and atraumatic.     Right Ear: Hearing, tympanic membrane, ear canal and external ear normal. There is no impacted cerumen.     Left Ear: Hearing, tympanic membrane, ear canal and external ear normal. There is no impacted cerumen.     Nose: Nose normal.     Mouth/Throat:     Mouth: Mucous membranes are moist.  Eyes:     General: Lids are normal.     Extraocular Movements: Extraocular movements intact.     Conjunctiva/sclera: Conjunctivae normal.  Pupils: Pupils are equal, round, and reactive to light.     Funduscopic exam:    Right eye: No papilledema.        Left eye: No papilledema.  Neck:     Thyroid: No thyroid mass.     Vascular: No carotid bruit.  Cardiovascular:     Rate and Rhythm: Normal rate and regular rhythm.     Pulses: Normal pulses.     Heart sounds: Normal heart sounds. No murmur heard. Pulmonary:     Effort: Pulmonary effort is normal.     Breath sounds: Normal breath sounds. No wheezing.  Chest:     Chest wall: No mass.  Breasts:    Tanner Score is 5.     Right: Normal. No mass or tenderness.     Left: Normal. No mass or tenderness.  Abdominal:     General: Abdomen is flat. Bowel sounds are normal. There is no distension.     Palpations: Abdomen is soft.     Tenderness: There is no abdominal tenderness.  Genitourinary:    Rectum: Guaiac result negative.  Musculoskeletal:        General: No swelling. Normal range of motion.     Cervical back: Full passive range of motion without pain, normal range of motion and neck supple.     Right lower leg: No edema.     Left lower leg: No edema.  Lymphadenopathy:     Upper Body:     Right upper body: No supraclavicular, axillary or pectoral adenopathy.     Left upper body:  No supraclavicular, axillary or pectoral adenopathy.  Skin:    General: Skin is warm and dry.     Capillary Refill: Capillary refill takes less than 2 seconds.     Comments: Posterior thigh tenderness  Neurological:     General: No focal deficit present.     Mental Status: She is alert and oriented to person, place, and time.     Cranial Nerves: No cranial nerve deficit.     Sensory: No sensory deficit.  Psychiatric:        Mood and Affect: Mood normal.        Behavior: Behavior normal.        Thought Content: Thought content normal.        Judgment: Judgment normal.       Assessment And Plan:     Encounter for annual health examination Assessment & Plan: Behavior modifications discussed and diet history reviewed.   Pt will continue to exercise regularly and modify diet with low GI, plant based foods and decrease intake of processed foods.  Recommend intake of daily multivitamin, Vitamin D , and calcium .  Recommend mammogram and colonoscopy for preventive screenings, as well as recommend immunizations that include influenza, TDAP, and Shingles (will get second one in 2 weeks)     Essential hypertension Assessment & Plan: B/P is controlled.  CMP ordered to check renal function.  The importance of regular exercise and dietary modification was stressed to the patient.   Orders: -     EKG 12-Lead -     POCT URINALYSIS DIP (CLINITEK) -     Microalbumin / creatinine urine ratio -     CMP14+EGFR  Abnormal glucose Assessment & Plan: HgbA1c is stable. Continue focusing on healthy diet and regular exercise  Orders: -     Hemoglobin A1c  Vitamin D  deficiency Assessment & Plan: Will check vitamin D  level and supplement as needed.  Also encouraged to spend 15 minutes in the sun daily.     Need for influenza vaccination -     Flu vaccine trivalent PF, 6mos and older(Flulaval,Afluria,Fluarix,Fluzone)  Herpes zoster vaccination declined Assessment & Plan: Declines  shingrix , educated on disease process and is aware if he changes his mind to notify office    Body mass index (BMI) of 26.0-26.9 in adult  Tobacco abuse Assessment & Plan: 40-year smoking history. Discussed health risks and smoking reduction. - Encourage reduction in smoking. - Order low dose CT scan for lung evaluation  Orders: -     CT CHEST LUNG CANCER SCREENING LOW DOSE WO CONTRAST; Future  Encounter for screening  Other long term (current) drug therapy -     CBC with Differential/Platelet  Mixed hyperlipidemia Assessment & Plan: Stable, continue low fat diet  Orders: -     Lipid panel  Muscle cramp Assessment & Plan: Muscle cramps in fingers, toes, legs, and feet. Possible electrolyte imbalance, magnesium  or potassium deficiency. - Check potassium and magnesium  levels. - Recommend magnesium  supplement or magnesium  spray.  Orders: -     Magnesium  -     CK       Return for 1 year physical, 6 month bp check. Patient was given opportunity to ask questions. Patient verbalized understanding of the plan and was able to repeat key elements of the plan. All questions were answered to their satisfaction.   Evelyn Ada, FNP  I, Evelyn Ada, FNP, have reviewed all documentation for this visit. The documentation on 03/15/24 for the exam, diagnosis, procedures, and orders are all accurate and complete.

## 2024-03-08 NOTE — Patient Instructions (Signed)
 You can take magnesium  supplement or use a magnesium  spray for the cramps  Health Maintenance  Topic Date Due   Breast Cancer Screening  11/20/2023   COVID-19 Vaccine (4 - 2025-26 season) 03/24/2024*   Zoster (Shingles) Vaccine (2 of 2) 06/07/2024*   Pap with HPV screening  11/19/2024   Colon Cancer Screening  07/08/2028   DTaP/Tdap/Td vaccine (2 - Td or Tdap) 10/01/2030   Pneumococcal Vaccine for age over 59  Completed   Flu Shot  Completed   Hepatitis C Screening  Completed   HIV Screening  Completed   Hepatitis B Vaccine  Aged Out   HPV Vaccine  Aged Out   Meningitis B Vaccine  Aged Out  *Topic was postponed. The date shown is not the original due date.

## 2024-03-09 LAB — CBC WITH DIFFERENTIAL/PLATELET
Basophils Absolute: 0.1 x10E3/uL (ref 0.0–0.2)
Basos: 1 %
EOS (ABSOLUTE): 0.1 x10E3/uL (ref 0.0–0.4)
Eos: 1 %
Hematocrit: 43.3 % (ref 34.0–46.6)
Hemoglobin: 14.1 g/dL (ref 11.1–15.9)
Immature Grans (Abs): 0.1 x10E3/uL (ref 0.0–0.1)
Immature Granulocytes: 1 %
Lymphocytes Absolute: 2.4 x10E3/uL (ref 0.7–3.1)
Lymphs: 28 %
MCH: 28.3 pg (ref 26.6–33.0)
MCHC: 32.6 g/dL (ref 31.5–35.7)
MCV: 87 fL (ref 79–97)
Monocytes Absolute: 0.7 x10E3/uL (ref 0.1–0.9)
Monocytes: 8 %
Neutrophils Absolute: 5.4 x10E3/uL (ref 1.4–7.0)
Neutrophils: 61 %
Platelets: 269 x10E3/uL (ref 150–450)
RBC: 4.99 x10E6/uL (ref 3.77–5.28)
RDW: 14.5 % (ref 11.7–15.4)
WBC: 8.8 x10E3/uL (ref 3.4–10.8)

## 2024-03-09 LAB — MICROALBUMIN / CREATININE URINE RATIO
Creatinine, Urine: 70.2 mg/dL
Microalb/Creat Ratio: <4 mg/g{creat} (ref 0–29)
Microalbumin, Urine: <3 ug/mL

## 2024-03-09 LAB — CMP14+EGFR
ALT: 21 IU/L (ref 0–32)
AST: 17 IU/L (ref 0–40)
Albumin: 4.5 g/dL (ref 3.9–4.9)
Alkaline Phosphatase: 70 IU/L (ref 49–135)
BUN/Creatinine Ratio: 22 (ref 12–28)
BUN: 17 mg/dL (ref 8–27)
Bilirubin Total: 0.4 mg/dL (ref 0.0–1.2)
CO2: 21 mmol/L (ref 20–29)
Calcium: 10.1 mg/dL (ref 8.7–10.3)
Chloride: 100 mmol/L (ref 96–106)
Creatinine, Ser: 0.78 mg/dL (ref 0.57–1.00)
Globulin, Total: 2.6 g/dL (ref 1.5–4.5)
Glucose: 87 mg/dL (ref 70–99)
Potassium: 4.2 mmol/L (ref 3.5–5.2)
Sodium: 137 mmol/L (ref 134–144)
Total Protein: 7.1 g/dL (ref 6.0–8.5)
eGFR: 86 mL/min/1.73 (ref >59)

## 2024-03-09 LAB — MAGNESIUM: Magnesium: 2 mg/dL (ref 1.6–2.3)

## 2024-03-09 LAB — HEMOGLOBIN A1C
Est. average glucose Bld gHb Est-mCnc: 126 mg/dL
Hgb A1c MFr Bld: 6 % — ABNORMAL HIGH (ref 4.8–5.6)

## 2024-03-09 LAB — LIPID PANEL
Chol/HDL Ratio: 2.4 ratio (ref 0.0–4.4)
Cholesterol, Total: 177 mg/dL (ref 100–199)
HDL: 73 mg/dL (ref >39)
LDL Chol Calc (NIH): 92 mg/dL (ref 0–99)
Triglycerides: 63 mg/dL (ref 0–149)
VLDL Cholesterol Cal: 12 mg/dL (ref 5–40)

## 2024-03-09 LAB — CK: Total CK: 74 U/L (ref 32–182)

## 2024-03-15 ENCOUNTER — Ambulatory Visit: Payer: Self-pay | Admitting: Nurse Practitioner

## 2024-03-15 ENCOUNTER — Telehealth: Payer: Self-pay

## 2024-03-15 DIAGNOSIS — R252 Cramp and spasm: Secondary | ICD-10-CM | POA: Insufficient documentation

## 2024-03-15 NOTE — Telephone Encounter (Signed)
 Copied from CRM [id number]. Topic: General - Other >> Mar 15, 2024  9:12 AM Deaijah H wrote: Reason for CRM: [facility] called in to check if an PA has been done. Need Authorization by 1030 or patient appointment will have to be cancelled. Please call [phone]

## 2024-03-15 NOTE — Assessment & Plan Note (Addendum)
 B/P is controlled.  CMP ordered to check renal function.  The importance of regular exercise and dietary modification was stressed to the patient.  EKG done with no change from previous

## 2024-03-15 NOTE — Assessment & Plan Note (Signed)
 40-year smoking history. Discussed health risks and smoking reduction. - Encourage reduction in smoking. - Order low dose CT scan for lung evaluation

## 2024-03-15 NOTE — Assessment & Plan Note (Signed)
 Behavior modifications discussed and diet history reviewed.   Pt will continue to exercise regularly and modify diet with low GI, plant based foods and decrease intake of processed foods.  Recommend intake of daily multivitamin, Vitamin D , and calcium .  Recommend mammogram and colonoscopy for preventive screenings, as well as recommend immunizations that include influenza, TDAP, and Shingles (will get second one in 2 weeks)

## 2024-03-15 NOTE — Assessment & Plan Note (Signed)
 Muscle cramps in fingers, toes, legs, and feet. Possible electrolyte imbalance, magnesium  or potassium deficiency. - Check potassium and magnesium  levels. - Recommend magnesium  supplement or magnesium  spray.

## 2024-03-15 NOTE — Assessment & Plan Note (Signed)
 Declines shingrix , educated on disease process and is aware if he changes his mind to notify office

## 2024-03-15 NOTE — Assessment & Plan Note (Signed)
Stable, continue low fat diet

## 2024-03-15 NOTE — Assessment & Plan Note (Signed)
HgbA1c is stable. Continue focusing on healthy diet and regular exercise.

## 2024-03-15 NOTE — Assessment & Plan Note (Signed)
 Will check vitamin D  level and supplement as needed.    Also encouraged to spend 15 minutes in the sun daily.

## 2024-03-16 ENCOUNTER — Ambulatory Visit
Admission: RE | Admit: 2024-03-16 | Discharge: 2024-03-16 | Disposition: A | Discharge status: Home / Self Care | Source: Ambulatory Visit | Attending: Nurse Practitioner | Admitting: Nurse Practitioner

## 2024-03-16 DIAGNOSIS — Z720 Tobacco use: Secondary | ICD-10-CM

## 2024-04-06 ENCOUNTER — Ambulatory Visit: Payer: Self-pay

## 2024-04-06 NOTE — Telephone Encounter (Signed)
 FYI Only or Action Required?: FYI only for provider.  Patient was last seen in primary care on 03/08/2024 by Georgina Speaks, FNP.  Called Nurse Triage reporting Results.  Symptoms began today.  Interventions attempted: Nothing.  Symptoms are: stable.  Triage Disposition: No disposition on file.  Patient/caregiver understands and will follow disposition?:   Copied from CRM [id number]. Topic: Clinical - Lab/Test Results >> Apr 06, 2024  3:48 PM Willma R wrote: Reason for CRM: Relayed patients lab results no additional questions. Patient would also like the results of her CT Scan. Warm transferred to NT Answer Assessment - Initial Assessment Questions 1. REASON FOR CALL: What is the main reason for your call? or How can I best help you?     CT scan results This RN read NP's interpretation and recommendations:    Your CT scan shows pulmonary nodules that are small at this time, we need to repeat in 1 year. Also incidentally they found adrenal adenomas which are typically benign, this can cause hypertension, muscle weakness, muscle cramping, fatigue and frequency in urination at night, I will refer you to an endocrinologist to see if there is a need for additional treatment. You do have aortic atherosclerosis - this means you have plaque build up in your arteries, make sure you are taking your rosuvastatin . You also have signs of emphysema if you are having a chronic cough we may nee to start you on a daily inhaler.    2. SYMPTOMS : Do you have any symptoms?      No new symptoms 3. OTHER QUESTIONS: Do you have any other questions?     Patient does not want to be contact through Mychart and reports does not use. Provided patient with mychart help desk number.  Protocols used: Information Only Call - No Triage-A-AH

## 2024-04-08 ENCOUNTER — Other Ambulatory Visit: Payer: Self-pay | Admitting: Nurse Practitioner

## 2024-04-08 DIAGNOSIS — I10 Essential (primary) hypertension: Secondary | ICD-10-CM

## 2024-04-18 ENCOUNTER — Other Ambulatory Visit: Payer: Self-pay | Admitting: Nurse Practitioner

## 2024-04-18 DIAGNOSIS — I10 Essential (primary) hypertension: Secondary | ICD-10-CM

## 2024-04-21 ENCOUNTER — Other Ambulatory Visit: Payer: Self-pay | Admitting: Nurse Practitioner

## 2024-04-21 DIAGNOSIS — I10 Essential (primary) hypertension: Secondary | ICD-10-CM

## 2024-04-21 NOTE — Telephone Encounter (Signed)
 Copied from CRM [id number]. Topic: Clinical - Prescription Issue >> Apr 21, 2024  4:50 PM Wess RAMAN wrote: Reason for CRM: Patient has not received her spironolactone  (ALDACTONE ) 25 MG tablet yet.  Error message found in chart: An error occurred while processing the e-prescribing message.   The message was not sent electronically to the requested pharmacy. Contact the pharmacy about the approved prescription.   Code: 601 - Receiver unable to process  Prescription no longer active   Callback #: [id number]  Pharmacy:  [id number] [address] [place], [place] - [address] [facility] [address] [place] [address] Phone: [phone] Fax: [phone] Hours: Not open 24 hours

## 2024-04-24 MED ORDER — SPIRONOLACTONE 25 MG PO TABS: 25.0000 mg | ORAL_TABLET | Freq: Every day | ORAL | 3 refills | Status: AC

## 2024-09-07 ENCOUNTER — Ambulatory Visit: Payer: Self-pay | Admitting: Nurse Practitioner

## 2025-03-12 ENCOUNTER — Encounter: Payer: Self-pay | Admitting: Nurse Practitioner
